# Patient Record
Sex: Female | Born: 1960
Health system: Southern US, Community
[De-identification: ages and names within clinical notes are randomized; demographics above are authoritative.]

## PROBLEM LIST (undated history)

## (undated) DIAGNOSIS — I739 Peripheral vascular disease, unspecified: Secondary | ICD-10-CM

## (undated) DIAGNOSIS — K029 Dental caries, unspecified: Secondary | ICD-10-CM

## (undated) DIAGNOSIS — G8929 Other chronic pain: Secondary | ICD-10-CM

## (undated) DIAGNOSIS — K219 Gastro-esophageal reflux disease without esophagitis: Secondary | ICD-10-CM

## (undated) DIAGNOSIS — E7800 Pure hypercholesterolemia, unspecified: Secondary | ICD-10-CM

## (undated) DIAGNOSIS — J189 Pneumonia, unspecified organism: Secondary | ICD-10-CM

## (undated) DIAGNOSIS — N3281 Overactive bladder: Secondary | ICD-10-CM

## (undated) DIAGNOSIS — E119 Type 2 diabetes mellitus without complications: Secondary | ICD-10-CM

## (undated) DIAGNOSIS — R569 Unspecified convulsions: Secondary | ICD-10-CM

## (undated) DIAGNOSIS — G4700 Insomnia, unspecified: Secondary | ICD-10-CM

## (undated) DIAGNOSIS — I10 Essential (primary) hypertension: Secondary | ICD-10-CM

## (undated) DIAGNOSIS — R519 Headache, unspecified: Secondary | ICD-10-CM

## (undated) DIAGNOSIS — I639 Cerebral infarction, unspecified: Secondary | ICD-10-CM

## (undated) DIAGNOSIS — G629 Polyneuropathy, unspecified: Secondary | ICD-10-CM

## (undated) DIAGNOSIS — R51 Headache: Secondary | ICD-10-CM

## (undated) DIAGNOSIS — F329 Major depressive disorder, single episode, unspecified: Secondary | ICD-10-CM

## (undated) DIAGNOSIS — F32A Depression, unspecified: Secondary | ICD-10-CM

---

## 1984-04-03 HISTORY — PX: TUBAL LIGATION: SHX77

## 1993-09-03 DIAGNOSIS — I639 Cerebral infarction, unspecified: Secondary | ICD-10-CM

## 1993-09-03 HISTORY — DX: Cerebral infarction, unspecified: I63.9

## 1993-11-01 HISTORY — PX: CEREBRAL ANEURYSM REPAIR: SHX164

## 1999-10-23 ENCOUNTER — Other Ambulatory Visit: Admission: RE | Admit: 1999-10-23 | Discharge: 1999-10-23 | Payer: Self-pay | Admitting: Obstetrics and Gynecology

## 2000-02-29 ENCOUNTER — Encounter: Payer: Self-pay | Admitting: Family Medicine

## 2000-02-29 ENCOUNTER — Encounter: Admission: RE | Admit: 2000-02-29 | Discharge: 2000-02-29 | Payer: Self-pay | Admitting: Family Medicine

## 2001-03-07 ENCOUNTER — Encounter: Payer: Self-pay | Admitting: Family Medicine

## 2001-03-07 ENCOUNTER — Encounter: Admission: RE | Admit: 2001-03-07 | Discharge: 2001-03-07 | Payer: Self-pay | Admitting: Family Medicine

## 2001-04-04 ENCOUNTER — Encounter: Admission: RE | Admit: 2001-04-04 | Discharge: 2001-04-04 | Payer: Self-pay | Admitting: Family Medicine

## 2001-04-04 ENCOUNTER — Encounter: Payer: Self-pay | Admitting: Family Medicine

## 2001-06-26 ENCOUNTER — Other Ambulatory Visit: Admission: RE | Admit: 2001-06-26 | Discharge: 2001-06-26 | Payer: Self-pay | Admitting: Obstetrics and Gynecology

## 2002-11-24 ENCOUNTER — Encounter (INDEPENDENT_AMBULATORY_CARE_PROVIDER_SITE_OTHER): Payer: Self-pay | Admitting: Specialist

## 2002-11-24 ENCOUNTER — Ambulatory Visit (HOSPITAL_COMMUNITY): Admission: RE | Admit: 2002-11-24 | Discharge: 2002-11-24 | Payer: Self-pay | Admitting: Obstetrics and Gynecology

## 2003-12-23 ENCOUNTER — Other Ambulatory Visit: Admission: RE | Admit: 2003-12-23 | Discharge: 2003-12-23 | Payer: Self-pay | Admitting: Family Medicine

## 2005-02-19 ENCOUNTER — Other Ambulatory Visit: Admission: RE | Admit: 2005-02-19 | Discharge: 2005-02-19 | Payer: Self-pay | Admitting: Family Medicine

## 2006-02-27 ENCOUNTER — Other Ambulatory Visit: Admission: RE | Admit: 2006-02-27 | Discharge: 2006-02-27 | Payer: Self-pay | Admitting: Family Medicine

## 2006-03-02 ENCOUNTER — Encounter: Admission: RE | Admit: 2006-03-02 | Discharge: 2006-03-02 | Payer: Self-pay | Admitting: Family Medicine

## 2007-07-03 ENCOUNTER — Encounter: Admission: RE | Admit: 2007-07-03 | Discharge: 2007-09-03 | Payer: Self-pay | Admitting: Family Medicine

## 2007-07-21 ENCOUNTER — Other Ambulatory Visit: Admission: RE | Admit: 2007-07-21 | Discharge: 2007-07-21 | Payer: Self-pay | Admitting: Family Medicine

## 2007-09-29 ENCOUNTER — Encounter: Admission: RE | Admit: 2007-09-29 | Discharge: 2007-09-30 | Payer: Self-pay | Admitting: Family Medicine

## 2008-04-15 ENCOUNTER — Encounter: Admission: RE | Admit: 2008-04-15 | Discharge: 2008-04-15 | Payer: Self-pay | Admitting: Family Medicine

## 2008-04-23 ENCOUNTER — Encounter: Admission: RE | Admit: 2008-04-23 | Discharge: 2008-04-23 | Payer: Self-pay | Admitting: Family Medicine

## 2008-12-20 ENCOUNTER — Other Ambulatory Visit: Admission: RE | Admit: 2008-12-20 | Discharge: 2008-12-20 | Payer: Self-pay | Admitting: Family Medicine

## 2009-01-24 LAB — HM COLONOSCOPY

## 2010-03-20 ENCOUNTER — Other Ambulatory Visit: Admission: RE | Admit: 2010-03-20 | Discharge: 2010-03-20 | Payer: Self-pay | Admitting: Family Medicine

## 2010-05-05 ENCOUNTER — Ambulatory Visit (HOSPITAL_COMMUNITY): Admission: RE | Admit: 2010-05-05 | Discharge: 2010-05-05 | Payer: Self-pay | Admitting: Family Medicine

## 2010-09-25 ENCOUNTER — Encounter: Payer: Self-pay | Admitting: Family Medicine

## 2011-01-19 NOTE — Op Note (Signed)
   NAME:  Jordan Rivas, Jordan Rivas                           ACCOUNT NO.:  [id number]   MEDICAL RECORD NO.:  [id number]                   PATIENT TYPE:  AMB   LOCATION:  SDC                                  FACILITY:  [facility]   PHYSICIAN:  Malva Limes, M.D.                 DATE OF BIRTH:  08-25-1961   DATE OF PROCEDURE:  11/24/2002  DATE OF DISCHARGE:                                 OPERATIVE REPORT   PREOPERATIVE DIAGNOSES:  Menorrhagia.   POSTOPERATIVE DIAGNOSES:  Menorrhagia.   PROCEDURE:  1. Dilatation and curettage.  2. Cryo ablation of the endometrium.   ANESTHESIA:  MAC with paracervical block.   ESTIMATED BLOOD LOSS:  Minimal.   COMPLICATIONS:  None.   SPECIMENS:  Endometrial curettings sent to pathology.   DRAINS:  None.   ANTIBIOTICS:  Ancef 1 g.   PROCEDURE:  The patient was taken to the operating room where she was placed  in a dorsal supine position.  MAC anesthesia was administered.  The patient  was prepped with Hibiclens and draped in the usual fashion for this  procedure.  A sterile speculum was placed in the vagina.  20 mL of 1%  lidocaine was used for paracervical block.  The cervical os was dilated  serially to a 66 [place].  Sharp curettage was performed with endometrial  curettings sent to pathology.  The cryo machine was then warmed up.  Once it  cycled through that process the probe was placed in the left cornua and  turned on for a period of seven minutes.  The opposite cornua was also froze  for seven minutes.  The uterus was sounded to 9 cm.  The patient tolerated  the procedure well.  She was taken to recovery room in stable condition.  Instrument and lap counts were correct x1.  The patient tolerated the  procedure well.  She was taken to recovery room in stable condition.  She  was discharged to home.  She will be told to follow up in the office in four  weeks.                                               Malva Limes, M.D.    MA/MEDQ  D:   11/24/2002  T:  11/24/2002  Job:  [id number]

## 2011-05-01 ENCOUNTER — Other Ambulatory Visit (HOSPITAL_COMMUNITY): Payer: Self-pay | Admitting: Family Medicine

## 2011-05-01 DIAGNOSIS — Z1231 Encounter for screening mammogram for malignant neoplasm of breast: Secondary | ICD-10-CM

## 2011-05-15 ENCOUNTER — Ambulatory Visit (HOSPITAL_COMMUNITY): Payer: Self-pay

## 2011-05-17 ENCOUNTER — Ambulatory Visit (HOSPITAL_COMMUNITY)
Admission: RE | Admit: 2011-05-17 | Discharge: 2011-05-17 | Disposition: A | Discharge status: Home / Self Care | Payer: Medicaid Other | Source: Ambulatory Visit | Attending: Family Medicine | Admitting: Family Medicine

## 2011-05-17 DIAGNOSIS — Z1231 Encounter for screening mammogram for malignant neoplasm of breast: Secondary | ICD-10-CM | POA: Insufficient documentation

## 2012-01-27 DIAGNOSIS — R569 Unspecified convulsions: Secondary | ICD-10-CM

## 2012-01-27 HISTORY — DX: Unspecified convulsions: R56.9

## 2012-01-28 ENCOUNTER — Emergency Department (HOSPITAL_COMMUNITY): Payer: PRIVATE HEALTH INSURANCE

## 2012-01-28 ENCOUNTER — Observation Stay (HOSPITAL_COMMUNITY)
Admission: EM | Admit: 2012-01-28 | Discharge: 2012-01-29 | Disposition: A | Discharge status: Home / Self Care | Payer: PRIVATE HEALTH INSURANCE | Attending: Family Medicine | Admitting: Family Medicine

## 2012-01-28 ENCOUNTER — Encounter (HOSPITAL_COMMUNITY): Payer: Self-pay

## 2012-01-28 ENCOUNTER — Inpatient Hospital Stay (HOSPITAL_COMMUNITY): Payer: PRIVATE HEALTH INSURANCE

## 2012-01-28 DIAGNOSIS — G9389 Other specified disorders of brain: Secondary | ICD-10-CM | POA: Insufficient documentation

## 2012-01-28 DIAGNOSIS — G894 Chronic pain syndrome: Secondary | ICD-10-CM | POA: Insufficient documentation

## 2012-01-28 DIAGNOSIS — E7800 Pure hypercholesterolemia, unspecified: Secondary | ICD-10-CM | POA: Diagnosis present

## 2012-01-28 DIAGNOSIS — I10 Essential (primary) hypertension: Secondary | ICD-10-CM | POA: Diagnosis present

## 2012-01-28 DIAGNOSIS — R569 Unspecified convulsions: Secondary | ICD-10-CM

## 2012-01-28 DIAGNOSIS — D320 Benign neoplasm of cerebral meninges: Secondary | ICD-10-CM

## 2012-01-28 DIAGNOSIS — I69959 Hemiplegia and hemiparesis following unspecified cerebrovascular disease affecting unspecified side: Secondary | ICD-10-CM | POA: Insufficient documentation

## 2012-01-28 DIAGNOSIS — G40309 Generalized idiopathic epilepsy and epileptic syndromes, not intractable, without status epilepticus: Secondary | ICD-10-CM

## 2012-01-28 DIAGNOSIS — Z8673 Personal history of transient ischemic attack (TIA), and cerebral infarction without residual deficits: Secondary | ICD-10-CM

## 2012-01-28 DIAGNOSIS — D72829 Elevated white blood cell count, unspecified: Secondary | ICD-10-CM | POA: Insufficient documentation

## 2012-01-28 DIAGNOSIS — K219 Gastro-esophageal reflux disease without esophagitis: Secondary | ICD-10-CM | POA: Diagnosis present

## 2012-01-28 DIAGNOSIS — G459 Transient cerebral ischemic attack, unspecified: Secondary | ICD-10-CM

## 2012-01-28 HISTORY — DX: Insomnia, unspecified: G47.00

## 2012-01-28 HISTORY — DX: Essential (primary) hypertension: I10

## 2012-01-28 HISTORY — DX: Other chronic pain: G89.29

## 2012-01-28 HISTORY — DX: Gastro-esophageal reflux disease without esophagitis: K21.9

## 2012-01-28 HISTORY — DX: Pure hypercholesterolemia, unspecified: E78.00

## 2012-01-28 HISTORY — DX: Peripheral vascular disease, unspecified: I73.9

## 2012-01-28 HISTORY — DX: Unspecified convulsions: R56.9

## 2012-01-28 HISTORY — DX: Cerebral infarction, unspecified: I63.9

## 2012-01-28 HISTORY — DX: Polyneuropathy, unspecified: G62.9

## 2012-01-28 HISTORY — DX: Overactive bladder: N32.81

## 2012-01-28 LAB — URINE MICROSCOPIC-ADD ON

## 2012-01-28 LAB — CBC
HCT: 36.6 % (ref 36.0–46.0)
HCT: 34.7 % — ABNORMAL LOW (ref 36.0–46.0)
Hemoglobin: 11.9 g/dL — ABNORMAL LOW (ref 12.0–15.0)
Hemoglobin: 11.1 g/dL — ABNORMAL LOW (ref 12.0–15.0)
MCH: 26.6 pg (ref 26.0–34.0)
MCHC: 32 g/dL (ref 30.0–36.0)
MCV: 81.9 fL (ref 78.0–100.0)
Platelets: 350 10*3/uL (ref 150–400)
RBC: 4.47 MIL/uL (ref 3.87–5.11)
RBC: 4.24 MIL/uL (ref 3.87–5.11)
WBC: 10.5 10*3/uL (ref 4.0–10.5)

## 2012-01-28 LAB — BASIC METABOLIC PANEL
CO2: 25 mEq/L (ref 19–32)
Chloride: 103 mEq/L (ref 96–112)
GFR calc non Af Amer: >90 mL/min (ref >90)
Glucose, Bld: 115 mg/dL — ABNORMAL HIGH (ref 70–99)
Potassium: 3.7 mEq/L (ref 3.5–5.1)
Sodium: 137 mEq/L (ref 135–145)

## 2012-01-28 LAB — COMPREHENSIVE METABOLIC PANEL
ALT: 22 U/L (ref 0–35)
Albumin: 3.5 g/dL (ref 3.5–5.2)
Alkaline Phosphatase: 112 U/L (ref 39–117)
Calcium: 9 mg/dL (ref 8.4–10.5)
GFR calc Af Amer: >90 mL/min (ref >90)
Potassium: 3.5 mEq/L (ref 3.5–5.1)
Sodium: 137 mEq/L (ref 135–145)
Total Protein: 7.6 g/dL (ref 6.0–8.3)

## 2012-01-28 LAB — DIFFERENTIAL
Eosinophils Absolute: 0.2 10*3/uL (ref 0.0–0.7)
Eosinophils Relative: 2 % (ref 0–5)
Lymphs Abs: 1.5 10*3/uL (ref 0.7–4.0)
Monocytes Absolute: 0.7 10*3/uL (ref 0.1–1.0)
Monocytes Relative: 7 % (ref 3–12)

## 2012-01-28 LAB — URINALYSIS, ROUTINE W REFLEX MICROSCOPIC
Bilirubin Urine: NEGATIVE
Glucose, UA: NEGATIVE mg/dL
Ketones, ur: NEGATIVE mg/dL
Protein, ur: NEGATIVE mg/dL
pH: 5.5 (ref 5.0–8.0)

## 2012-01-28 LAB — RAPID URINE DRUG SCREEN, HOSP PERFORMED
Amphetamines: NOT DETECTED
Benzodiazepines: NOT DETECTED
Tetrahydrocannabinol: NOT DETECTED

## 2012-01-28 LAB — GLUCOSE, CAPILLARY: Glucose-Capillary: 98 mg/dL (ref 70–99)

## 2012-01-28 LAB — LIPID PANEL
LDL Cholesterol: 73 mg/dL (ref 0–99)
VLDL: 22 mg/dL (ref 0–40)

## 2012-01-28 LAB — PROTIME-INR: Prothrombin Time: 12.9 seconds (ref 11.6–15.2)

## 2012-01-28 LAB — HEMOGLOBIN A1C: Hgb A1c MFr Bld: 6.2 % — ABNORMAL HIGH (ref <5.7)

## 2012-01-28 MED ORDER — SODIUM CHLORIDE 0.9 % IV BOLUS (SEPSIS)
1000.0000 mL | Freq: Once | INTRAVENOUS | Status: AC
  Administered 2012-01-28: 1000 mL via INTRAVENOUS

## 2012-01-28 MED ORDER — SIMVASTATIN 40 MG PO TABS
40.0000 mg | ORAL_TABLET | Freq: Every day | ORAL | Status: DC
  Filled 2012-01-28: qty 1

## 2012-01-28 MED ORDER — GABAPENTIN 300 MG PO CAPS
300.0000 mg | ORAL_CAPSULE | Freq: Three times a day (TID) | ORAL | Status: DC | PRN
  Filled 2012-01-28: qty 1

## 2012-01-28 MED ORDER — ONDANSETRON HCL 4 MG/2ML IJ SOLN: 4.0000 mg | Freq: Four times a day (QID) | INTRAMUSCULAR | Status: DC | PRN

## 2012-01-28 MED ORDER — LORAZEPAM 2 MG/ML IJ SOLN: 1.0000 mg | Freq: Three times a day (TID) | INTRAMUSCULAR | Status: DC | PRN

## 2012-01-28 MED ORDER — ASPIRIN 81 MG PO CHEW
81.0000 mg | CHEWABLE_TABLET | Freq: Every day | ORAL | Status: DC
  Administered 2012-01-29: 81 mg via ORAL

## 2012-01-28 MED ORDER — OXYCODONE HCL 5 MG PO TABS: 5.0000 mg | ORAL_TABLET | Freq: Four times a day (QID) | ORAL | Status: DC | PRN

## 2012-01-28 MED ORDER — SENNOSIDES-DOCUSATE SODIUM 8.6-50 MG PO TABS: 1.0000 | ORAL_TABLET | Freq: Every evening | ORAL | Status: DC | PRN

## 2012-01-28 MED ORDER — ENOXAPARIN SODIUM 30 MG/0.3ML ~~LOC~~ SOLN
30.0000 mg | SUBCUTANEOUS | Status: DC
  Administered 2012-01-28: 30 mg via SUBCUTANEOUS
  Filled 2012-01-28 (×2): qty 0.3

## 2012-01-28 MED ORDER — MORPHINE SULFATE ER 30 MG PO TBCR
30.0000 mg | EXTENDED_RELEASE_TABLET | Freq: Two times a day (BID) | ORAL | Status: DC
  Administered 2012-01-28 – 2012-01-29 (×2): 30 mg via ORAL
  Filled 2012-01-28 (×3): qty 1

## 2012-01-28 MED ORDER — ASPIRIN 81 MG PO CHEW
81.0000 mg | CHEWABLE_TABLET | Freq: Once | ORAL | Status: DC
  Filled 2012-01-28 (×4): qty 1

## 2012-01-28 MED ORDER — PANTOPRAZOLE SODIUM 40 MG PO TBEC
40.0000 mg | DELAYED_RELEASE_TABLET | Freq: Every day | ORAL | Status: DC
  Administered 2012-01-28 – 2012-01-29 (×2): 40 mg via ORAL
  Filled 2012-01-28 (×2): qty 1

## 2012-01-28 MED ORDER — SODIUM CHLORIDE 0.9 % IV SOLN
INTRAVENOUS | Status: AC
  Administered 2012-01-28 (×2): via INTRAVENOUS

## 2012-01-28 MED ORDER — ATORVASTATIN CALCIUM 20 MG PO TABS
20.0000 mg | ORAL_TABLET | Freq: Every day | ORAL | Status: DC
  Administered 2012-01-28: 20 mg via ORAL
  Filled 2012-01-28 (×2): qty 1

## 2012-01-28 MED ORDER — IRBESARTAN 75 MG PO TABS
75.0000 mg | ORAL_TABLET | Freq: Every day | ORAL | Status: DC
  Filled 2012-01-28: qty 1

## 2012-01-28 MED ORDER — DIPHENHYDRAMINE HCL 25 MG PO CAPS: 25.0000 mg | ORAL_CAPSULE | ORAL | Status: DC | PRN

## 2012-01-28 MED ORDER — VERAPAMIL HCL ER 240 MG PO TBCR
240.0000 mg | EXTENDED_RELEASE_TABLET | Freq: Every day | ORAL | Status: DC
  Filled 2012-01-28: qty 1

## 2012-01-28 MED ORDER — ACETAMINOPHEN 325 MG PO TABS
650.0000 mg | ORAL_TABLET | Freq: Four times a day (QID) | ORAL | Status: DC | PRN
  Administered 2012-01-28 – 2012-01-29 (×2): 650 mg via ORAL
  Filled 2012-01-28 (×2): qty 2

## 2012-01-28 MED ORDER — AMITRIPTYLINE HCL 25 MG PO TABS
25.0000 mg | ORAL_TABLET | Freq: Every day | ORAL | Status: DC
  Administered 2012-01-28: 25 mg via ORAL
  Filled 2012-01-28 (×2): qty 2

## 2012-01-28 MED ORDER — LEVETIRACETAM 500 MG PO TABS
500.0000 mg | ORAL_TABLET | Freq: Two times a day (BID) | ORAL | Status: DC
  Administered 2012-01-28 – 2012-01-29 (×3): 500 mg via ORAL
  Filled 2012-01-28 (×4): qty 1

## 2012-01-28 NOTE — Evaluation (Signed)
Occupational Therapy Evaluation Patient Details Name: Jordan Rivas MRN: [id number] DOB: 1960-12-14 Today's Date: 01/28/2012 Time: 1400-1420 OT Time Calculation (min): 20 min  OT Assessment / Plan / Recommendation Clinical Impression  51 yo female s/p seizures that has returned to baseline. Recommend Outipatient for splinting of Lt UE to prevent contractures    OT Assessment  All further OT needs can be met in the next venue of care    Follow Up Recommendations  Outpatient OT (SPLINT LT UE)    Barriers to Discharge      Equipment Recommendations  None recommended by PT    Recommendations for Other Services    Frequency       Precautions / Restrictions Precautions Precautions: Fall Precaution Comments: Secondary to old CVA   Pertinent Vitals/Pain none    ADL  Eating/Feeding: Simulated;Set up Where Assessed - Eating/Feeding: Edge of bed Grooming: Performed;Wash/dry hands;Supervision/safety Where Assessed - Grooming: Unsupported standing Lower Body Dressing: Performed;Supervision/safety Where Assessed - Lower Body Dressing: Unsupported sitting Toilet Transfer: Performed;Supervision/safety Toilet Transfer Method: Sit to Barista: Regular height toilet Toileting - Clothing Manipulation and Hygiene: Performed;Supervision/safety Where Assessed - Engineer, mining and Hygiene: Sit to stand from 3-in-1 or toilet Equipment Used: Cane;Gait belt Transfers/Ambulation Related to ADLs: Pt ambulating Supervision level ADL Comments: Pt able to don prafo and shoes at EOB. pt could benefit from splint on Lt UE resting hand and recommending OUtpatient to address    OT Diagnosis:    OT Problem List:   OT Treatment Interventions:     OT Goals    Visit Information  Last OT Received On: 01/28/12 Assistance Needed: +1    Subjective Data  Subjective: " No I dont wear a brace" (no brace for Lt UE) Patient Stated Goal: to go home   Prior  Functioning  Home Living Lives With: Spouse Available Help at Discharge: Family;Available PRN/intermittently Type of Home: House Home Access: Stairs to enter [employer] of Steps: 5 Entrance Stairs-Rails: Left;Right;Can reach both Home Layout: One level Bathroom Shower/Tub: Engineer, manufacturing systems: Standard Home Adaptive Equipment: Straight cane;Shower chair with back;Wheelchair - powered Additional Comments: Brace left leg. Uses cane at all times for gait. Intermittently uses W/C in home Prior Function Level of Independence: Needs assistance Needs Assistance: Light Housekeeping;Meal Prep Meal Prep: Total Light Housekeeping: Total Able to Take Stairs?: Yes Driving: No Vocation: On disability Communication Communication: No difficulties Dominant Hand: Right    Cognition  Overall Cognitive Status: Appears within functional limits for tasks assessed/performed Arousal/Alertness: Awake/alert Orientation Level: Appears intact for tasks assessed Behavior During Session: [facility] for tasks performed    Extremity/Trunk Assessment Right Upper Extremity Assessment RUE ROM/Strength/Tone: Within functional levels Left Upper Extremity Assessment LUE ROM/Strength/Tone: Deficits LUE ROM/Strength/Tone Deficits: deltoid AROM, scapula elevation, No AROM of Lt UE. pt presents with cortical thumb that can range out to neutral, 3rd digit with boutonniere deformity LUE Coordination: Deficits Right Lower Extremity Assessment RLE ROM/Strength/Tone: Within functional levels RLE Sensation: WFL - Light Touch;WFL - Proprioception RLE Coordination: WFL - gross/fine motor Left Lower Extremity Assessment LLE ROM/Strength/Tone: Deficits LLE ROM/Strength/Tone Deficits: Chronic deficits from stroke at age 57. Wears left AFO for gait. Limitations at hip, knee, and ankle resulting in abnormal gait, but no evidence of buckling and good control of descent.  Trunk Assessment Trunk Assessment:  Normal   Mobility Bed Mobility Bed Mobility: Supine to Sit;Sitting - Scoot to Edge of Bed;Sit to Supine Supine to Sit: 6: Modified independent (Device/Increase time) Sitting -  Scoot to [employer] of Bed: 7: Independent Sit to Supine: 6: Modified independent (Device/Increase time) Details for Bed Mobility Assistance: Crosses legs to raise and lower leg to side and back on to bed.  Transfers Sit to Stand: Without upper extremity assist;From bed;From toilet;With upper extremity assist;6: Modified independent (Device/Increase time) Stand to Sit: 6: Modified independent (Device/Increase time);To toilet;To bed;With upper extremity assist   Exercise    Balance High Level Balance High Level Balance Activites: Side stepping;Direction changes;Turns High Level Balance Comments: Reaching for soap at sink - all without loss of balance.  End of Session OT - End of Session Activity Tolerance: Patient tolerated treatment well Patient left: in bed;with call bell/phone within reach;with family/visitor present (husband and nephew) Nurse Communication: Mobility status   Lucile Shutters 01/28/2012, 2:59 PM Pager: [phone]

## 2012-01-28 NOTE — Progress Notes (Signed)
SLP Note  Patient screened at bedside. Cognitive-linguistic abilities have returned to baseline. Patient alert and oriented without speech concerns. Will defer formal evaluation. Please re-consult as needed.  Ferdinand Lango MA, CCC-SLP [phone]   Tamara Monteith Meryl 01/28/2012, 9:21 AM

## 2012-01-28 NOTE — Evaluation (Signed)
Physical Therapy Evaluation Patient Details Name: Jordan Rivas MRN: [id number] DOB: October 13, 1960 Today's Date: 01/28/2012 Time: 4098-1191 PT Time Calculation (min): 18 min  PT Assessment / Plan / Recommendation Clinical Impression  Patient with new onset of seizures. She is at baseline for function, but has limitations due to her prior CVA. She has no further PT needs.    PT Assessment  Patent does not need any further PT services    Follow Up Recommendations  No PT follow up    Barriers to Discharge  None      lEquipment Recommendations  None recommended by PT    Recommendations for Other Services  None   Frequency  N/A    Precautions / Restrictions Precautions Precautions: Fall Precaution Comments: Secondary to old CVA   Pertinent Vitals/Pain VSS/no pain      Mobility  Bed Mobility Bed Mobility: Supine to Sit;Sitting - Scoot to Edge of Bed;Sit to Supine Supine to Sit: 6: Modified independent (Device/Increase time) Sitting - Scoot to Edge of Bed: 7: Independent Sit to Supine: 6: Modified independent (Device/Increase time) Details for Bed Mobility Assistance: Crosses legs to raise and lower leg to side and back on to bed.  Transfers Transfers: Sit to Stand;Stand to Sit Sit to Stand: Without upper extremity assist;From bed;From toilet;With upper extremity assist;6: Modified independent (Device/Increase time) Stand to Sit: 6: Modified independent (Device/Increase time);To toilet;To bed;With upper extremity assist Ambulation/Gait Ambulation/Gait Assistance: 6: Modified independent (Device/Increase time) Ambulation Distance (Feet): 150 Feet Assistive device: Straight cane Gait Pattern: Left circumduction;Decreased hip/knee flexion - left;Step-through pattern;Decreased stride length     Visit Information  Last PT Received On: 01/28/12    Subjective Data  Subjective: Patient reports feeling back to normal Patient Stated Goal: Home   Prior Functioning   Lives with  spouse. Available PRN Needs assistance with housekeeping and cooking etc.  Indep. with gait/transfers    Cognition  Overall Cognitive Status: Appears within functional limits for tasks assessed/performed Arousal/Alertness: Awake/alert Orientation Level: Appears intact for tasks assessed Behavior During Session: [facility] for tasks performed    Extremity/Trunk Assessment Right Lower Extremity Assessment RLE ROM/Strength/Tone: Within functional levels RLE Sensation: WFL - Light Touch;WFL - Proprioception RLE Coordination: WFL - gross/fine motor Left Lower Extremity Assessment LLE ROM/Strength/Tone: Deficits LLE ROM/Strength/Tone Deficits: Chronic deficits from stroke at age 51. Wears left AFO for gait. Limitations at hip, knee, and ankle resulting in abnormal gait, but no evidence of buckling and good control of descent.    Balance High Level Balance High Level Balance Activites: Side stepping;Direction changes;Turns High Level Balance Comments: Reaching for soap at sink - all without loss of balance.  End of Session PT - End of Session Equipment Utilized During Treatment: Gait belt Activity Tolerance: Patient tolerated treatment well Patient left: in bed;with call bell/phone within reach;with family/visitor present   Edwyna Perfect, PT  Pager [phone]  01/28/2012, 2:31 PM

## 2012-01-28 NOTE — Progress Notes (Signed)
pts BP manually 92/58, pt asymptomatic; Dr.Laza paged and made aware, new orders received

## 2012-01-28 NOTE — H&P (Addendum)
PCP:  Lupita Raider, MD, MD   DOA:  01/28/2012 12:52 AM  Chief Complaint:  Seizure episodes  HPI: Pt is 51 yo female with prior history of stroke who presents to [facility] ED with main concern of sudden and new onset, witnessed generalized tonic-clonic seizure activity that last approximately 1- 2 minutes and with post ictal period of about 15 minutes in duration. This episode has occurred 1-2 hours prior to admission and pt reports gradually improving. Pt reports she was in her usual state of health and when she got up to go to the bathroom she fell down and does not remember much after that. Pt denies similar episodes in the past, no no specific focal neurologic weakness, no headaches at this time and no visual changes. She also denies fevers, chills, any specific abdominal or urinary concerns, no neck stiffness, no bowel or bladder incontinence. Pt does reports loss of consciousness and tongue biting.  Allergies: Allergies  Allergen Reactions  . Celebrex (Celecoxib) Swelling    Prior to Admission medications   Medication Sig Start Date End Date Taking? Authorizing Provider  amitriptyline (ELAVIL) 25 MG tablet Take 25-50 mg by mouth at bedtime.   Yes Historical Provider, MD  atorvastatin (LIPITOR) 20 MG tablet Take 20 mg by mouth daily.   Yes Historical Provider, MD  esomeprazole (NEXIUM) 40 MG capsule Take 40 mg by mouth 2 (two) times daily.   Yes Historical Provider, MD  gabapentin (NEURONTIN) 300 MG capsule Take 300 mg by mouth every 8 (eight) hours as needed. For pain   Yes Historical Provider, MD  olmesartan (BENICAR) 20 MG tablet Take 20 mg by mouth daily.   Yes Historical Provider, MD  oxybutynin (DITROPAN-XL) 10 MG 24 hr tablet Take 10 mg by mouth 2 (two) times daily.   Yes Historical Provider, MD  oxyCODONE (OXY IR/ROXICODONE) 5 MG immediate release tablet Take 5 mg by mouth every 8 (eight) hours as needed. For pain   Yes Historical Provider, MD  oxymorphone (OPANA ER) 40 MG 12 hr  tablet Take 40 mg by mouth every 12 (twelve) hours.   Yes Historical Provider, MD  verapamil (CALAN-SR) 240 MG CR tablet Take 240 mg by mouth daily.   Yes Historical Provider, MD    Past Medical History  Diagnosis Date  . Hypertension   . Hypercholesteremia   . Insomnia   . Neuropathy   . GERD (gastroesophageal reflux disease)   . Overactive bladder   . CVA (cerebrovascular accident)     15 years ago    Past Surgical History  Procedure Date  . Brain surgery     post CVA n.o.s.    Social History:  reports that she has never smoked. She has never used smokeless tobacco. She reports that she does not drink alcohol or use illicit drugs.  Family History  Problem Relation Age of Onset  . Dementia Mother   . Diabetes type II Mother   . Heart attack Father     Review of Systems:  Constitutional: Denies fever, chills, diaphoresis, appetite change and fatigue.  HEENT: Denies photophobia, eye pain, redness, hearing loss, ear pain, congestion, sore throat, rhinorrhea, sneezing, mouth sores, trouble swallowing, neck pain, neck stiffness and tinnitus.   Respiratory: Denies SOB, DOE, cough, chest tightness,  and wheezing.   Cardiovascular: Denies chest pain, palpitations and leg swelling.  Gastrointestinal: Denies nausea, vomiting, abdominal pain, diarrhea, constipation, blood in stool and abdominal distention.  Genitourinary: Denies dysuria, urgency, frequency, hematuria, flank pain and difficulty  urinating.  Musculoskeletal: Denies myalgias, back pain, joint swelling, arthralgias and gait problem.  Skin: Denies pallor, rash and wound.  Neurological: Denies dizziness, numbness and headaches.  Hematological: Denies adenopathy. Easy bruising, personal or family bleeding history  Psychiatric/Behavioral: Denies suicidal ideation, mood changes, confusion, nervousness, sleep disturbance and agitation  Physical Exam:  Filed Vitals:   01/28/12 0102 01/28/12 0106  BP:  129/102  Pulse:  106   Temp:  98.6 F (37 C)  TempSrc:  Oral  Resp:  18  Height:  5' (1.524 m)  Weight:  72.576 kg (160 lb)  SpO2: 98% 95%    Constitutional: Vital signs reviewed.  Patient is in no acute distress and cooperative with exam. Alert and oriented x3.  Head: Normocephalic and atraumatic Ear: TM normal bilaterally Mouth: no erythema or exudates, MMM Eyes: PERRL, EOMI, conjunctivae normal, No scleral icterus.  Neck: Supple, Trachea midline normal ROM, No JVD, mass, thyromegaly, or carotid bruit present.  Cardiovascular: RRR, S1 normal, S2 normal, no MRG, pulses symmetric and intact bilaterally Pulmonary/Chest: CTAB, no wheezes, rales, or rhonchi Abdominal: Soft. Non-tender, non-distended, bowel sounds are normal, no masses, organomegaly, or guarding present.  GU: no CVA tenderness Musculoskeletal: No joint deformities, erythema, or stiffness, ROM full and no nontender Ext: no edema and no cyanosis, pulses palpable bilaterally (DP and PT) Hematology: no cervical, inginal, or axillary adenopathy.  Neurological: A&O x3, Strenght decreased on the left side upper and lower extremity when compared to the right side, cranial nerve II-XII are grossly intact, sensory intact to light touch bilaterally.  Skin: Warm, dry and intact. No rash, cyanosis, or clubbing.  Psychiatric: Normal mood and affect. speech and behavior is normal. Judgment and thought content normal. Cognition and memory are normal.   Labs on Admission:       Component Value Range   Glucose-Capillary 98  70 - 99 (mg/dL)          Component Value Range   Color, Urine YELLOW  YELLOW    APPearance CLEAR  CLEAR    Specific Gravity, Urine 1.013  1.005 - 1.030    pH 5.5  5.0 - 8.0    Glucose, UA NEGATIVE  NEGATIVE (mg/dL)   Hgb urine dipstick TRACE (*) NEGATIVE    Bilirubin Urine NEGATIVE  NEGATIVE    Ketones, ur NEGATIVE  NEGATIVE (mg/dL)   Protein, ur NEGATIVE  NEGATIVE (mg/dL)   Urobilinogen, UA 0.2  0.0 - 1.0 (mg/dL)   Nitrite  NEGATIVE  NEGATIVE    Leukocytes, UA NEGATIVE  NEGATIVE           Component Value Range   Opiates NONE DETECTED  NONE DETECTED    Cocaine NONE DETECTED  NONE DETECTED    Benzodiazepines NONE DETECTED  NONE DETECTED    Amphetamines NONE DETECTED  NONE DETECTED    Tetrahydrocannabinol NONE DETECTED  NONE DETECTED    Barbiturates NONE DETECTED  NONE DETECTED           Component Value Range   Squamous Epithelial / LPF RARE  RARE    WBC, UA 0-2  <3 (WBC/hpf)   RBC / HPF 0-2  <3 (RBC/hpf)   Bacteria, UA RARE  RARE       Component Value Range   WBC 10.6 (*) 4.0 - 10.5 (K/uL)   Hemoglobin 11.9 (*) 12.0 - 15.0 (g/dL)   HCT 16.1  09.6 - 04.5 (%)   MCV 81.9  78.0 - 100.0 (fL)   Platelets 350  150 -  400 (K/uL)      Component Value Range   Sodium 137  135 - 145 (mEq/L)   Potassium 3.5  3.5 - 5.1 (mEq/L)   Chloride 102  96 - 112 (mEq/L)   CO2 26  19 - 32 (mEq/L)   Glucose, Bld 98  70 - 99 (mg/dL)   BUN 8  6 - 23 (mg/dL)   Creatinine, Ser 1.61  0.50 - 1.10 (mg/dL)   Calcium 9.0  8.4 - 09.6 (mg/dL)   Total Protein 7.6  6.0 - 8.3 (g/dL)   Albumin 3.5  3.5 - 5.2 (g/dL)   AST 19  0 - 37 (U/L)   ALT 22  0 - 35 (U/L)   Alkaline Phosphatase 112  39 - 117 (U/L)   Total Bilirubin 0.2  0.3 - 1.2 (mg/dL)   GFR calc non Af Amer 83 >90 (mL/min)   GFR calc Af Amer >90  >90 (mL/min)          Component Value Range   Prothrombin Time 12.9  11.6 - 15.2   INR 0.95  0.00 - 1.49     Radiological Exams on Admission:  CT HEAD  01/28/2012 IMPRESSION:   Postoperative changes with the right frontal craniotomy.   Encephalomalacia in the right frontal region consistent with old infarct.   Right posterior parafalcine mass consistent with meningioma, stable since previous MRI.   No acute intracranial process is suggested.   Assessment/Plan  Seizure episode - unclear etiology at this time but pt is certainly high risk for CVA given prior history and the above findings on CT HEAD - will admit the  pt to telemetry floor for further evaluation and management - obtain MRI brain/Head for further evaluation - will also need the rest of the stroke work up and per protocol will proceed with Carotid Dopplers, 2 D ECHO, EEG - will check Lipid panel, A1C,TSH - one dose aspirin given in ED, will continue daily, please add statin if lipid panel suggestive of uncontrolled HLD - PT/OT/SLP evaluation - will defer to primary team if neurology consult needed but at this point pt is hemodynamically stable - this would be considered first seizure activity and therefore no antiepileptic medical management is indicated at this time as no clear source is noted yet - meningioma can certainly be additional potential trigger for seizure but per CT head this has been stable in appearance from previous study  Leukocytosis - mild and likely secondary to the acute event noted above - no infectious etiology is noted at this point, pt is afebrile, UA is negative - will obtain CBC in AM  HTN - controlled - continue home medication regimen  DVT Prophylaxis - Lovenox  Code Status - Full  Education  - test results and diagnostic studies were discussed with patient  - patient verbalized the understanding - questions were answered at the bedside and contact information was provided for additional questions or concerns  Time Spent on Admission: Over 30 minutes  MAGICK-Britton Perkinson 01/28/2012, 3:57 AM  [facility] Pager # [phone] Main Office # [phone]

## 2012-01-28 NOTE — ED Notes (Signed)
Per the patient's significant other, he found the patient on the floor of their bathroom having what resembles a tonic clonic seizure.  The patient and states that she has no personal or family history of prior seizures.  The patient presents with left-sided weakness which the patient and significant other state is consistent with her baseline since a CVA 15 years ago.  On arrival, EMS stated the patient was postictal.  At this time, the patient is AOx4.

## 2012-01-28 NOTE — Progress Notes (Signed)
[facility] PROGRESS NOTE  Jordan Rivas ZOX:[id number] DOB: 10-30-1960 DOA: 01/28/2012 PCP: Lupita Raider, MD, MD  Assessment/Plan: 1. Seizure - new onset - suspect due to old scar from old CVA. To start AED with Keppra today. Appreciate Dr. Chrystie Nose /input. I discussed with patient outpt f/u and she says he will f/u with her pain specialist who has hired recently a neurologist. If patient is seizure free during the day today she may be able to DC tomorrow 01/29/12 2. Chronic pain syndrome - on Opana at home - will resume mscontin and oxycodone prn  3. GERD on PPI  Principal Problem:  *Seizure Active Problems:  Hypertension  Hypercholesteremia  GERD (gastroesophageal reflux disease)  H/O: CVA (cardiovascular accident) Jordan Koury, MD  [facility] Pager [phone]  If 7PM-7AM, please contact night-coverage [email or web address] Password [email or web address] 01/28/2012, 2:04 PM   LOS: 0 days     Consultants:  Neurohospitalist   Procedures:  MRI    HPI/Subjective: No new events  Objective: Filed Vitals:   01/28/12 0439 01/28/12 0500 01/28/12 0600 01/28/12 0800  BP:  109/62 105/66 96/57  Pulse:  76 80   Temp: 98.8 F (37.1 C) 98.7 F (37.1 C)    TempSrc: Oral Oral    Resp:  18    Height:      Weight:      SpO2: 96% 96%     No intake or output data in the 24 hours ending 01/28/12 1404  Exam:   General:  Alert and oriented x3  Cardiovascular: RRR, no murmurs , rubs or gallops  Respiratory: CTAB  Abdomen: soft, NT  Dense right hemiplegia   Data Reviewed: Basic Metabolic Panel:  Lab 01/28/12 8295 01/28/12 0209  NA 137 137  K 3.7 3.5  CL 103 102  CO2 25 26  GLUCOSE 115* 98  BUN 7 8  CREATININE 0.71 0.80  CALCIUM 8.5 9.0  MG -- --  PHOS -- --   Liver Function Tests:  Lab 01/28/12 0209  AST 19  ALT 22  ALKPHOS 112  BILITOT 0.2*  PROT 7.6  ALBUMIN 3.5   No results found for this basename: LIPASE:5,AMYLASE:5 in the last 168 hours No  results found for this basename: AMMONIA:5 in the last 168 hours CBC:  Lab 01/28/12 0445 01/28/12 0209  WBC 10.5 10.6*  NEUTROABS -- 8.2*  HGB 11.1* 11.9*  HCT 34.7* 36.6  MCV 81.8 81.9  PLT 374 350   Cardiac Enzymes: No results found for this basename: CKTOTAL:5,CKMB:5,CKMBINDEX:5,TROPONINI:5 in the last 168 hours BNP (last 3 results) No results found for this basename: PROBNP:3 in the last 8760 hours CBG:  Lab 01/28/12 0154  GLUCAP 98    No results found for this or any previous visit (from the past 240 hour(s)).   Studies: Dg Chest 2 View  01/28/2012  *RADIOLOGY REPORT*  Clinical Data: Seizures.  Shortness of breath.  CHEST - 2 VIEW  Comparison: None.  Findings: Shallow inspiration. The heart size and pulmonary vascularity are normal. The lungs appear clear and expanded without focal air space disease or consolidation. No blunting of the costophrenic angles.  No pneumothorax.  IMPRESSION: No evidence of active pulmonary disease.  Original Report Authenticated By: Marlon Pel, M.D.   Ct Head Wo Contrast  01/28/2012  *RADIOLOGY REPORT*  Clinical Data: Seizures.  Left-sided weakness.  CT HEAD WITHOUT CONTRAST  Technique:  Contiguous axial images were obtained from the base of the skull through the vertex without contrast.  Comparison: MRI  brain 03/02/2006  Findings: Old encephalomalacia in the right frontal region with associated right ventricular dilatation consistent with old infarct as seen on previous MRI.  Ventricles and sulci are otherwise symmetrical.  No mass effect or midline shift.  No abnormal extra- axial fluid collections.  There is a rounded focal extra-axial hyperintense lesion adjacent to the posterior falx on the right measuring 11 mm diameter.  This is consistent with a small calcified meningioma and is stable since the prior MRI.  Basal cisterns are not effaced.  No evidence of acute intracranial hemorrhage.  Calcification of the anterior falx.  Postoperative  changes with right frontal craniotomy and suturing of the bone flap.  No depressed skull fractures.  Visualized paranasal sinuses and mastoid air cells are not opacified.  IMPRESSION: Postoperative changes with the right frontal craniotomy. Encephalomalacia in the right frontal region consistent with old infarct.  Right posterior parafalcine mass consistent with meningioma, stable since previous MRI.  No acute intracranial process is suggested.  Original Report Authenticated By: Marlon Pel, M.D.    Scheduled Meds:    . aspirin  81 mg Oral Once  . aspirin  81 mg Oral Daily  . atorvastatin  20 mg Oral q1800  . enoxaparin  30 mg Subcutaneous Q24H  . levETIRAcetam  500 mg Oral BID  . pantoprazole  40 mg Oral Q1200  . sodium chloride  1,000 mL Intravenous Once  . DISCONTD: irbesartan  75 mg Oral Daily  . DISCONTD: simvastatin  40 mg Oral q1800  . DISCONTD: verapamil  240 mg Oral Daily   Continuous Infusions:    . sodium chloride 50 mL/hr at 01/28/12 0747

## 2012-01-28 NOTE — ED Notes (Addendum)
Seizure pads has been placed on bed rails.Husband at bedside.

## 2012-01-28 NOTE — ED Provider Notes (Signed)
History     CSN: [id number]  Arrival date & time 01/28/12  1478   First MD Initiated Contact with Patient 01/28/12 0132      Chief Complaint  Patient presents with  . Seizures    (Consider location/radiation/quality/duration/timing/severity/associated sxs/prior treatment) HPI Comments: No prior history of seizures. She does have a history of a stroke that is remote. She's on several medications. Witnessed generalized tonic-clonic seizure activity.  Lasted 1-2 min.  10-15 min postictal  Patient is a 51 y.o. female presenting with seizures. The history is provided by the patient. No language interpreter was used.  Seizures  This is a new problem. The current episode started 1 to 2 hours ago. The problem has been gradually improving. There was 1 seizure. The most recent episode lasted 30 to 120 seconds. Pertinent negatives include no sleepiness, no confusion, no headaches, no visual disturbance, no neck stiffness, no sore throat, no chest pain, no cough, no nausea and no vomiting. Characteristics include rhythmic jerking, loss of consciousness and bit tongue. Characteristics do not include eye blinking, eye deviation, bowel incontinence, bladder incontinence, apnea or cyanosis. The episode was witnessed. There was no sensation of an aura present. The seizures did not continue in the ED. The seizure(s) had no focality. There has been no fever.    Past Medical History  Diagnosis Date  . Hypertension   . Hypercholesteremia   . Insomnia   . Neuropathy   . GERD (gastroesophageal reflux disease)   . Overactive bladder   . CVA (cerebrovascular accident)     15 years ago    Past Surgical History  Procedure Date  . Brain surgery     post CVA n.o.s.    Family History  Problem Relation Age of Onset  . Dementia Mother   . Diabetes type II Mother   . Heart attack Father     History  Substance Use Topics  . Smoking status: Never Smoker   . Smokeless tobacco: Never Used  . Alcohol  Use: No    OB History    Grav Para Term Preterm Abortions TAB SAB Ect Mult Living   2 2 2              Review of Systems  Constitutional: Negative for fever, activity change, appetite change and fatigue.  HENT: Negative for congestion, sore throat, rhinorrhea, neck pain and neck stiffness.   Eyes: Negative for visual disturbance.  Respiratory: Negative for apnea, cough and shortness of breath.   Cardiovascular: Negative for chest pain, palpitations and cyanosis.  Gastrointestinal: Negative for nausea, vomiting, abdominal pain and bowel incontinence.  Genitourinary: Negative for bladder incontinence, dysuria, urgency, frequency and flank pain.  Musculoskeletal: Negative for myalgias, back pain and arthralgias.  Neurological: Positive for seizures and loss of consciousness. Negative for dizziness, weakness, light-headedness, numbness and headaches.  Psychiatric/Behavioral: Negative for confusion.  All other systems reviewed and are negative.    Allergies  Celebrex  Home Medications   Current Outpatient Rx  Name Route Sig Dispense Refill  . AMITRIPTYLINE HCL 25 MG PO TABS Oral Take 25-50 mg by mouth at bedtime.    . ATORVASTATIN CALCIUM 20 MG PO TABS Oral Take 20 mg by mouth daily.    Marland Kitchen ESOMEPRAZOLE MAGNESIUM 40 MG PO CPDR Oral Take 40 mg by mouth 2 (two) times daily.    Marland Kitchen GABAPENTIN 300 MG PO CAPS Oral Take 300 mg by mouth every 8 (eight) hours as needed. For pain    . OLMESARTAN MEDOXOMIL  20 MG PO TABS Oral Take 20 mg by mouth daily.    . OXYBUTYNIN CHLORIDE ER 10 MG PO TB24 Oral Take 10 mg by mouth 2 (two) times daily.    . OXYCODONE HCL 5 MG PO TABS Oral Take 5 mg by mouth every 8 (eight) hours as needed. For pain    . OXYMORPHONE HCL ER 40 MG PO TB12 Oral Take 40 mg by mouth every 12 (twelve) hours.    Marland Kitchen VERAPAMIL HCL ER 240 MG PO TBCR Oral Take 240 mg by mouth daily.      BP 129/102  Pulse 106  Temp(Src) 98.6 F (37 C) (Oral)  Resp 18  Ht 5' (1.524 m)  Wt 160 lb  (72.576 kg)  BMI 31.25 kg/m2  SpO2 95%  Physical Exam  Nursing note and vitals reviewed. Constitutional: She is oriented to person, place, and time. She appears well-developed and well-nourished. No distress.  HENT:  Head: Normocephalic and atraumatic.  Mouth/Throat: Oropharynx is clear and moist.       Pt bit her lip - bleeding controlled  Eyes: Conjunctivae and EOM are normal. Pupils are equal, round, and reactive to light.  Neck: Normal range of motion. Neck supple.  Cardiovascular: Normal rate, regular rhythm, normal heart sounds and intact distal pulses.  Exam reveals no gallop and no friction rub.   No murmur heard. Pulmonary/Chest: Effort normal and breath sounds normal. No respiratory distress. She exhibits no tenderness.  Abdominal: Soft. Bowel sounds are normal. There is no tenderness. There is no rebound and no guarding.  Neurological: She is alert and oriented to person, place, and time. No cranial nerve deficit.       Residual L sided deficits from cva  Skin: Skin is warm and dry. No rash noted.    ED Course  Procedures (including critical care time)   Date: 01/28/2012  Rate: 87  Rhythm: normal sinus rhythm  QRS Axis: normal  Intervals: normal  ST/T Wave abnormalities: normal  Conduction Disutrbances:none  Narrative Interpretation:   Old EKG Reviewed: none available  Labs Reviewed  CBC - Abnormal; Notable for the following:    WBC 10.6 (*)    Hemoglobin 11.9 (*)    All other components within normal limits  DIFFERENTIAL - Abnormal; Notable for the following:    Neutro Abs 8.2 (*)    All other components within normal limits  URINALYSIS, ROUTINE W REFLEX MICROSCOPIC - Abnormal; Notable for the following:    Hgb urine dipstick TRACE (*)    All other components within normal limits  COMPREHENSIVE METABOLIC PANEL - Abnormal; Notable for the following:    Total Bilirubin 0.2 (*)    GFR calc non Af Amer 83 (*)    All other components within normal limits    URINE MICROSCOPIC-ADD ON - Abnormal; Notable for the following:    Casts HYALINE CASTS (*)    All other components within normal limits  URINE RAPID DRUG SCREEN (HOSP PERFORMED)  PROTIME-INR  GLUCOSE, CAPILLARY   Ct Head Wo Contrast  01/28/2012  *RADIOLOGY REPORT*  Clinical Data: Seizures.  Left-sided weakness.  CT HEAD WITHOUT CONTRAST  Technique:  Contiguous axial images were obtained from the base of the skull through the vertex without contrast.  Comparison: MRI brain 03/02/2006  Findings: Old encephalomalacia in the right frontal region with associated right ventricular dilatation consistent with old infarct as seen on previous MRI.  Ventricles and sulci are otherwise symmetrical.  No mass effect or midline shift.  No abnormal extra- axial fluid collections.  There is a rounded focal extra-axial hyperintense lesion adjacent to the posterior falx on the right measuring 11 mm diameter.  This is consistent with a small calcified meningioma and is stable since the prior MRI.  Basal cisterns are not effaced.  No evidence of acute intracranial hemorrhage.  Calcification of the anterior falx.  Postoperative changes with right frontal craniotomy and suturing of the bone flap.  No depressed skull fractures.  Visualized paranasal sinuses and mastoid air cells are not opacified.  IMPRESSION: Postoperative changes with the right frontal craniotomy. Encephalomalacia in the right frontal region consistent with old infarct.  Right posterior parafalcine mass consistent with meningioma, stable since previous MRI.  No acute intracranial process is suggested.  Original Report Authenticated By: Marlon Pel, M.D.     1. Seizure       MDM  The patient's presentation is consistent with a new onset seizure. She has no prior history of seizure disorders. EKG with no evidence of TCA overdose. She's not had any additional seizure activity in the emergency department. Urine drug screen is negative despite being  on pain medication. CT is relatively unremarkable. MR was ordered. She'll be admitted to a telemetry bed [facility]. Syncope was also considered as a possible etiology however in the post ictal phase is more consistent with a seizure-like presentation        Dayton Bailiff, MD 01/28/12 [phone]

## 2012-01-28 NOTE — ED Notes (Signed)
NPO dc'ed pt passed bedside swallow eval. Pt stated on reg diet at home.

## 2012-01-28 NOTE — ED Notes (Signed)
Returned from xray

## 2012-01-28 NOTE — Consult Note (Signed)
[facility] CONSULT NOTE     Reason for Consult: seizures   HPI:    Jordan Rivas is an 51 y.o. female who fell in the bathroom while standing at the sink. Husband was in next room and heard her fall. She says that he saw her "jerking all over" for a "few" minutes. He called EMS. She says that her tongue is sore and that she was confused after event. No incontinence. She does not drive.   She describes an event 18 years ago with Dr. Elesa Hacker where she had a brain bleed. He had to do surgery. She never had a seizure during that event that she can recall. This event lead her to the point of no mobility/strength in left extremities, but sensation intact, mild left facial droop as well.  Past Medical History  Diagnosis Date  . Hypertension   . Hypercholesteremia   . Insomnia   . Neuropathy   . GERD (gastroesophageal reflux disease)   . Overactive bladder   . CVA (cerebrovascular accident)     15 years ago    Past Surgical History  Procedure Date  . Brain surgery     post CVA n.o.s.    Family History  Problem Relation Age of Onset  . Dementia Mother   . Diabetes type II Mother   . Heart attack Father     Social History:  Denies tobacco, etoh or illicit drug uses  Allergies  Allergen Reactions  . Celebrex (Celecoxib) Swelling    Medications:    Prior to Admission:  Prescriptions prior to admission  Medication Sig Dispense Refill  . amitriptyline (ELAVIL) 25 MG tablet Take 25-50 mg by mouth at bedtime.      Marland Kitchen atorvastatin (LIPITOR) 20 MG tablet Take 20 mg by mouth daily.      Marland Kitchen esomeprazole (NEXIUM) 40 MG capsule Take 40 mg by mouth 2 (two) times daily.      Marland Kitchen gabapentin (NEURONTIN) 300 MG capsule Take 300 mg by mouth every 8 (eight) hours as needed. For pain      . olmesartan (BENICAR) 20 MG tablet Take 20 mg by mouth at bedtime.      Marland Kitchen oxybutynin (DITROPAN-XL) 10 MG 24 hr tablet Take 10 mg by mouth 2 (two) times daily.      Marland Kitchen oxyCODONE  (OXY IR/ROXICODONE) 5 MG immediate release tablet Take 5 mg by mouth every 8 (eight) hours as needed. For pain      . oxymorphone (OPANA ER) 40 MG 12 hr tablet Take 40 mg by mouth every 12 (twelve) hours.      . verapamil (CALAN-SR) 240 MG CR tablet Take 240 mg by mouth at bedtime.       Scheduled:   . aspirin  81 mg Oral Once  . aspirin  81 mg Oral Daily  . atorvastatin  20 mg Oral q1800  . enoxaparin  30 mg Subcutaneous Q24H  . pantoprazole  40 mg Oral Q1200  . sodium chloride  1,000 mL Intravenous Once  . DISCONTD: irbesartan  75 mg Oral Daily  . DISCONTD: simvastatin  40 mg Oral q1800  . DISCONTD: verapamil  240 mg Oral Daily    Review of Systems - General ROS: negative for - chills, fatigue, fever or hot flashes Hematological and Lymphatic ROS: negative for - bruising, fatigue, jaundice or pallor Endocrine ROS: negative  for - hair pattern changes, hot flashes, mood swings or skin changes Respiratory ROS: negative for - cough, hemoptysis, orthopnea or wheezing Cardiovascular ROS: negative for - dyspnea on exertion, orthopnea, palpitations or shortness of breath Gastrointestinal ROS: negative for - abdominal pain, appetite loss, blood in stools, diarrhea or hematemesis Musculoskeletal ROS: negative for - joint pain, joint stiffness, left sided hemiparesis with contracture of left arm, joint swelling or muscle pain Neurological ROS: positive for - gait disturbance, impaired coordination/balance due to left sided hemiparesis and seizures Dermatological ROS: negative for dry skin, pruritus and rash    Blood pressure 96/57, pulse 80, temperature 98.7 F (37.1 C), temperature source Oral, resp. rate 18, height 5' (1.524 m), weight 72.576 kg (160 lb), SpO2 96.00%.   Neurologic Examination:   Mental Status: Alert, oriented, thought content appropriate.  Speech fluent without evidence of aphasia. Able to follow 3 step commands without difficulty. Cranial Nerves: II-Visual fields  grossly intact. III/IV/VI-Extraocular movements intact.  Pupils reactive bilaterally. V/VII-Smile asymmetric, left facial droop at nasolabial fold. VIII-grossly intact IX/X-normal gag XI-bilateral shoulder shrug XII-midline tongue extension. Does appear reddened on exam, with white scattered lesions c/w tongue biting.  Motor: 5/5 right, no spontaneous movement left. She is unable to move arm/leg without my assistance.  She has a left arm contracture. Sensory: Pinprick and light touch intact throughout, bilaterally Deep Tendon Reflexes: 1+ right, none noted left body. Plantars downgoing right, left mute. Cerebellar: Normal finger-to-nose, normal rapid alternating movements and normal heel-to-shin test right leg only.     Lab Results  Component Value Date/Time   CHOL 133 01/28/2012  4:45 AM    Results for orders placed during the hospital encounter of 01/28/12 (from the past 48 hour(s))  GLUCOSE, CAPILLARY     Status: Normal   Collection Time   01/28/12  1:54 AM      Component Value Range Comment   Glucose-Capillary 98  70 - 99 (mg/dL)   URINALYSIS, ROUTINE W REFLEX MICROSCOPIC     Status: Abnormal   Collection Time   01/28/12  1:59 AM      Component Value Range Comment   Color, Urine YELLOW  YELLOW     APPearance CLEAR  CLEAR     Specific Gravity, Urine 1.013  1.005 - 1.030     pH 5.5  5.0 - 8.0     Glucose, UA NEGATIVE  NEGATIVE (mg/dL)    Hgb urine dipstick TRACE (*) NEGATIVE     Bilirubin Urine NEGATIVE  NEGATIVE     Ketones, ur NEGATIVE  NEGATIVE (mg/dL)    Protein, ur NEGATIVE  NEGATIVE (mg/dL)    Urobilinogen, UA 0.2  0.0 - 1.0 (mg/dL)    Nitrite NEGATIVE  NEGATIVE     Leukocytes, UA NEGATIVE  NEGATIVE    URINE RAPID DRUG SCREEN (HOSP PERFORMED)     Status: Normal   Collection Time   01/28/12  1:59 AM      Component Value Range Comment   Opiates NONE DETECTED  NONE DETECTED     Cocaine NONE DETECTED  NONE DETECTED     Benzodiazepines NONE DETECTED  NONE DETECTED      Amphetamines NONE DETECTED  NONE DETECTED     Tetrahydrocannabinol NONE DETECTED  NONE DETECTED     Barbiturates NONE DETECTED  NONE DETECTED    URINE MICROSCOPIC-ADD ON     Status: Abnormal   Collection Time   01/28/12  1:59 AM      Component Value Range Comment  Squamous Epithelial / LPF RARE  RARE     WBC, UA 0-2  <3 (WBC/hpf)    RBC / HPF 0-2  <3 (RBC/hpf)    Bacteria, UA RARE  RARE     Casts HYALINE CASTS (*) NEGATIVE    CBC     Status: Abnormal   Collection Time   01/28/12  2:09 AM      Component Value Range Comment   WBC 10.6 (*) 4.0 - 10.5 (K/uL)    RBC 4.47  3.87 - 5.11 (MIL/uL)    Hemoglobin 11.9 (*) 12.0 - 15.0 (g/dL)    HCT 16.1  09.6 - 04.5 (%)    MCV 81.9  78.0 - 100.0 (fL)    MCH 26.6  26.0 - 34.0 (pg)    MCHC 32.5  30.0 - 36.0 (g/dL)    RDW 40.9  81.1 - 91.4 (%)    Platelets 350  150 - 400 (K/uL)   DIFFERENTIAL     Status: Abnormal   Collection Time   01/28/12  2:09 AM      Component Value Range Comment   Neutrophils Relative 77  43 - 77 (%)    Neutro Abs 8.2 (*) 1.7 - 7.7 (K/uL)    Lymphocytes Relative 14  12 - 46 (%)    Lymphs Abs 1.5  0.7 - 4.0 (K/uL)    Monocytes Relative 7  3 - 12 (%)    Monocytes Absolute 0.7  0.1 - 1.0 (K/uL)    Eosinophils Relative 2  0 - 5 (%)    Eosinophils Absolute 0.2  0.0 - 0.7 (K/uL)    Basophils Relative 0  0 - 1 (%)    Basophils Absolute 0.0  0.0 - 0.1 (K/uL)   COMPREHENSIVE METABOLIC PANEL     Status: Abnormal   Collection Time   01/28/12  2:09 AM      Component Value Range Comment   Sodium 137  135 - 145 (mEq/L)    Potassium 3.5  3.5 - 5.1 (mEq/L)    Chloride 102  96 - 112 (mEq/L)    CO2 26  19 - 32 (mEq/L)    Glucose, Bld 98  70 - 99 (mg/dL)    BUN 8  6 - 23 (mg/dL)    Creatinine, Ser 7.82  0.50 - 1.10 (mg/dL)    Calcium 9.0  8.4 - 10.5 (mg/dL)    Total Protein 7.6  6.0 - 8.3 (g/dL)    Albumin 3.5  3.5 - 5.2 (g/dL)    AST 19  0 - 37 (U/L)    ALT 22  0 - 35 (U/L)    Alkaline Phosphatase 112  39 - 117 (U/L)     Total Bilirubin 0.2 (*) 0.3 - 1.2 (mg/dL)    GFR calc non Af Amer 83 (*) >90 (mL/min)    GFR calc Af Amer >90  >90 (mL/min)   PROTIME-INR     Status: Normal   Collection Time   01/28/12  2:09 AM      Component Value Range Comment   Prothrombin Time 12.9  11.6 - 15.2 (seconds)    INR 0.95  0.00 - 1.49    LIPID PANEL     Status: Abnormal   Collection Time   01/28/12  4:45 AM      Component Value Range Comment   Cholesterol 133  0 - 200 (mg/dL)    Triglycerides 956  <150 (mg/dL)    HDL 38 (*) >21 (mg/dL)  Total CHOL/HDL Ratio 3.5      VLDL 22  0 - 40 (mg/dL)    LDL Cholesterol 73  0 - 99 (mg/dL)   CBC     Status: Abnormal   Collection Time   01/28/12  4:45 AM      Component Value Range Comment   WBC 10.5  4.0 - 10.5 (K/uL)    RBC 4.24  3.87 - 5.11 (MIL/uL)    Hemoglobin 11.1 (*) 12.0 - 15.0 (g/dL)    HCT 16.1 (*) 09.6 - 46.0 (%)    MCV 81.8  78.0 - 100.0 (fL)    MCH 26.2  26.0 - 34.0 (pg)    MCHC 32.0  30.0 - 36.0 (g/dL)    RDW 04.5  40.9 - 81.1 (%)    Platelets 374  150 - 400 (K/uL)   BASIC METABOLIC PANEL     Status: Abnormal   Collection Time   01/28/12  4:45 AM      Component Value Range Comment   Sodium 137  135 - 145 (mEq/L)    Potassium 3.7  3.5 - 5.1 (mEq/L)    Chloride 103  96 - 112 (mEq/L)    CO2 25  19 - 32 (mEq/L)    Glucose, Bld 115 (*) 70 - 99 (mg/dL)    BUN 7  6 - 23 (mg/dL)    Creatinine, Ser 9.14  0.50 - 1.10 (mg/dL)    Calcium 8.5  8.4 - 10.5 (mg/dL)    GFR calc non Af Amer >90  >90 (mL/min)    GFR calc Af Amer >90  >90 (mL/min)     Dg Chest 2 View  01/28/2012  *RADIOLOGY REPORT*  Clinical Data: Seizures.  Shortness of breath.  CHEST - 2 VIEW  Comparison: None.  Findings: Shallow inspiration. The heart size and pulmonary vascularity are normal. The lungs appear clear and expanded without focal air space disease or consolidation. No blunting of the costophrenic angles.  No pneumothorax.  IMPRESSION: No evidence of active pulmonary disease.  Original Report  Authenticated By: Marlon Pel, M.D.   Ct Head Wo Contrast  01/28/2012  *RADIOLOGY REPORT*  Clinical Data: Seizures.  Left-sided weakness.  CT HEAD WITHOUT CONTRAST  Technique:  Contiguous axial images were obtained from the base of the skull through the vertex without contrast.  Comparison: MRI brain 03/02/2006  Findings: Old encephalomalacia in the right frontal region with associated right ventricular dilatation consistent with old infarct as seen on previous MRI.  Ventricles and sulci are otherwise symmetrical.  No mass effect or midline shift.  No abnormal extra- axial fluid collections.  There is a rounded focal extra-axial hyperintense lesion adjacent to the posterior falx on the right measuring 11 mm diameter.  This is consistent with a small calcified meningioma and is stable since the prior MRI.  Basal cisterns are not effaced.  No evidence of acute intracranial hemorrhage.  Calcification of the anterior falx.  Postoperative changes with right frontal craniotomy and suturing of the bone flap.  No depressed skull fractures.  Visualized paranasal sinuses and mastoid air cells are not opacified.  IMPRESSION: Postoperative changes with the right frontal craniotomy. Encephalomalacia in the right frontal region consistent with old infarct.  Right posterior parafalcine mass consistent with meningioma, stable since previous MRI.  No acute intracranial process is suggested.  Original Report Authenticated By: Marlon Pel, M.D.   Mr Brain Wo Contrast  01/28/2012  *RADIOLOGY REPORT*  Clinical Data:  MRI HEAD WITHOUT  CONTRAST MRA HEAD WITHOUT CONTRAST  Technique:  Multiplanar, multiecho pulse sequences of the brain and surrounding structures were obtained without intravenous contrast. Angiographic images of the head were obtained using MRA technique without contrast.  Comparison:  Head CT same day.  MRI 03/02/2006  MRI HEAD  Findings:  Diffusion imaging does not show any acute or subacute infarction.   There is Wallerian degeneration within the brainstem on the right but no sign of brain stem infarction.  No cerebellar infarction.  The cerebral hemispheres show old infarction within the right basal ganglia, insula and frontoparietal region that has progressed atrophy and encephalomalacia with adjacent gliosis. There are some old blood products in that region.  The remainder the brain shows atrophy with old small vessel infarctions within the deep white matter.  No obstructive hydrocephalus.  No intra- axial mass lesion.  1.3 cm meningioma projecting rightward from the posterior falx is chronically unchanged and should not be of clinical relevance.  No extra-axial collection.  No pituitary mass. No inflammatory sinus disease.  Changes of previous right-sided craniotomy.  IMPRESSION: No acute finding.  Old infarction in the right basal ganglia, insula and right frontoparietal region with atrophy encephalomalacia.  Chronic small vessel disease within the hemispheric white matter.  1.3 cm meningioma projecting rightward from the posterior falx, stable over time.  MRA HEAD  Findings: Both internal carotid arteries are patent into the brain. The anterior and middle cerebral vessels are patent without proximal stenosis, aneurysm or vascular malformation.  Both vertebral arteries are patent to the basilar.  No basilar stenosis. Posterior circulation branch vessels appear normal.  IMPRESSION: Normal intracranial MR angiography of the large and medium-sized vessels.  Original Report Authenticated By: Thomasenia Sales, M.D.   Mr Mra Head/brain Wo Cm  01/28/2012  *RADIOLOGY REPORT*  Clinical Data:  MRI HEAD WITHOUT CONTRAST MRA HEAD WITHOUT CONTRAST  Technique:  Multiplanar, multiecho pulse sequences of the brain and surrounding structures were obtained without intravenous contrast. Angiographic images of the head were obtained using MRA technique without contrast.  Comparison:  Head CT same day.  MRI 03/02/2006  MRI HEAD   Findings:  Diffusion imaging does not show any acute or subacute infarction.  There is Wallerian degeneration within the brainstem on the right but no sign of brain stem infarction.  No cerebellar infarction.  The cerebral hemispheres show old infarction within the right basal ganglia, insula and frontoparietal region that has progressed atrophy and encephalomalacia with adjacent gliosis. There are some old blood products in that region.  The remainder the brain shows atrophy with old small vessel infarctions within the deep white matter.  No obstructive hydrocephalus.  No intra- axial mass lesion.  1.3 cm meningioma projecting rightward from the posterior falx is chronically unchanged and should not be of clinical relevance.  No extra-axial collection.  No pituitary mass. No inflammatory sinus disease.  Changes of previous right-sided craniotomy.  IMPRESSION: No acute finding.  Old infarction in the right basal ganglia, insula and right frontoparietal region with atrophy encephalomalacia.  Chronic small vessel disease within the hemispheric white matter.  1.3 cm meningioma projecting rightward from the posterior falx, stable over time.  MRA HEAD  Findings: Both internal carotid arteries are patent into the brain. The anterior and middle cerebral vessels are patent without proximal stenosis, aneurysm or vascular malformation.  Both vertebral arteries are patent to the basilar.  No basilar stenosis. Posterior circulation branch vessels appear normal.  IMPRESSION: Normal intracranial MR angiography of the large and  medium-sized vessels.  Original Report Authenticated By: Thomasenia Sales, M.D.     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 Now presenting with new onset seizures. Currently back to her baseline.  Seizures likely related to her area of cerebral injury from her old insults.   Known 1.3 cm meningioma projecting rightward from the posterior falx, this has been stable over time.  Plan: 1.  Continue aspirin therapy.  2.  Seizure precautions 3.  Keppra 500mg  BID 4.  F/U meningioma with repeat imaging in one year.   Gwendolyn Lima. Manson Passey, [facility] [phone]  01/28/2012, 11:39 AM    Patient seen and examined. I agree with the above.  Thana Farr, MD [facility] [phone]  01/28/2012  1:37 PM

## 2012-01-29 ENCOUNTER — Ambulatory Visit (HOSPITAL_COMMUNITY): Payer: Medicare Other

## 2012-01-29 DIAGNOSIS — I10 Essential (primary) hypertension: Secondary | ICD-10-CM

## 2012-01-29 DIAGNOSIS — G40309 Generalized idiopathic epilepsy and epileptic syndromes, not intractable, without status epilepticus: Secondary | ICD-10-CM

## 2012-01-29 DIAGNOSIS — G459 Transient cerebral ischemic attack, unspecified: Secondary | ICD-10-CM

## 2012-01-29 MED ORDER — LEVETIRACETAM 500 MG PO TABS: 500.0000 mg | ORAL_TABLET | Freq: Two times a day (BID) | ORAL | Status: DC

## 2012-01-29 NOTE — Progress Notes (Signed)
Pt received discharge instructions with no further questions. Enforced importance of taking medication as directed and when to call the doctor. Went over signs of heart attack and stroke. Pt has no further questions. Volunteers to wheel out to car for husband to transport home. Duwaine Maxin, RN

## 2012-01-29 NOTE — Progress Notes (Addendum)
History:  51 yo female with witnessed seizure activity by husband.  Placed on Keppra here in hospital.    Subjective: Patient says that she feels fine. No further seizure activity. Husband has been with her constantly.  Objective: BP 123/78  Pulse 74  Temp(Src) 98.4 F (36.9 C) (Oral)  Resp 18  Ht 5' (1.524 m)  Wt 72.576 kg (160 lb)  BMI 31.25 kg/m2  SpO2 95%  LMP 09/30/2011  CBGs  Basename 01/28/12 0154  GLUCAP 98     Medications: Scheduled:   . amitriptyline  25-50 mg Oral QHS  . aspirin  81 mg Oral Once  . aspirin  81 mg Oral Daily  . atorvastatin  20 mg Oral q1800  . enoxaparin  30 mg Subcutaneous Q24H  . levETIRAcetam  500 mg Oral BID  . morphine  30 mg Oral Q12H  . pantoprazole  40 mg Oral Q1200  . DISCONTD: irbesartan  75 mg Oral Daily  . DISCONTD: verapamil  240 mg Oral Daily    History obtained from the patient  General ROS: negative for - chills, fatigue, fever, night sweats, weight gain or weight loss Psychological ROS: negative for - behavioral disorder, hallucinations, memory difficulties, mood swings or suicidal ideation Ophthalmic ROS: negative for - blurry vision, double vision, eye pain or loss of vision ENT ROS: negative for - epistaxis, nasal discharge, oral lesions, sore throat, tinnitus or vertigo Allergy and Immunology ROS: negative for - hives or itchy/watery eyes Hematological and Lymphatic ROS: negative for - bleeding problems, bruising or swollen lymph nodes Endocrine ROS: negative for - galactorrhea, hair pattern changes, polydipsia/polyuria or temperature intolerance Respiratory ROS: negative for - cough, hemoptysis, shortness of breath or wheezing Cardiovascular ROS: negative for - chest pain, dyspnea on exertion, edema or irregular heartbeat Gastrointestinal ROS: negative for - abdominal pain, diarrhea, hematemesis, nausea/vomiting or stool incontinence Genito-Urinary ROS: negative for - dysuria, hematuria, incontinence or urinary  frequency/urgency Musculoskeletal ROS: negative for - joint swelling or muscular weakness Neurological ROS: as noted in HPI Dermatological ROS: negative for rash and skin lesion changes    Neurologic Exam: Mental Status: Alert, oriented, thought content appropriate.  Speech fluent without evidence of aphasia. Able to follow 3 step commands without difficulty. Cranial Nerves: II- Visual fields grossly intact. III/IV/VI-Extraocular movements intact.  Pupils reactive bilaterally. V/VII-Smile symmetric VIII-hearing grossly intact IX/X-normal gag XI-bilateral shoulder shrug XII-midline tongue extension Motor: 5/5 right, no spontaneous movement left. Left arm contracture Sensory: Light touch intact throughout, bilaterally Deep Tendon Reflex's: 1+ right, non noted left body, Plantars downgoing right, left mute Cerebellar: Normal finger-to-nose, normal rapid alternating movements and normal heel-to-shin test right left only..   Lab Results: CBC:  Lab 01/28/12 0445 01/28/12 0209  WBC 10.5 10.6*  NEUTROABS -- 8.2*  HGB 11.1* 11.9*  HCT 34.7* 36.6  MCV 81.8 81.9  PLT 374 350   Basic Metabolic Panel:  Lab 01/28/12 1610 01/28/12 0209  NA 137 137  K 3.7 3.5  CL 103 102  CO2 25 26  GLUCOSE 115* 98  BUN 7 8  CREATININE 0.71 0.80  CALCIUM 8.5 9.0  MG -- --  PHOS -- --   Liver Function Tests:  Lab 01/28/12 0209  AST 19  ALT 22  ALKPHOS 112  BILITOT 0.2*  PROT 7.6  ALBUMIN 3.5   Hemoglobin A1C:  Lab 01/28/12 0445  HGBA1C 6.2*   Fasting Lipid Panel:  Lab 01/28/12 0445  CHOL 133  HDL 38*  LDLCALC 73  TRIG 960  CHOLHDL 3.5  LDLDIRECT --   Thyroid Function Tests: No results found for this basename: TSH,T4TOTAL,FREET4,T3FREE,THYROIDAB in the last 168 hours Coagulation:  Lab 01/28/12 0209  LABPROT 12.9  INR 0.95     Study Results: Dg Chest 2 View  01/28/2012  *RADIOLOGY REPORT*  Clinical Data: Seizures.  Shortness of breath.  CHEST - 2 VIEW  Comparison:  None.  Findings: Shallow inspiration. The heart size and pulmonary vascularity are normal. The lungs appear clear and expanded without focal air space disease or consolidation. No blunting of the costophrenic angles.  No pneumothorax.  IMPRESSION: No evidence of active pulmonary disease.  Original Report Authenticated By: Marlon Pel, M.D.   Ct Head Wo Contrast  01/28/2012  *RADIOLOGY REPORT*  Clinical Data: Seizures.  Left-sided weakness.  CT HEAD WITHOUT CONTRAST  Technique:  Contiguous axial images were obtained from the base of the skull through the vertex without contrast.  Comparison: MRI brain 03/02/2006  Findings: Old encephalomalacia in the right frontal region with associated right ventricular dilatation consistent with old infarct as seen on previous MRI.  Ventricles and sulci are otherwise symmetrical.  No mass effect or midline shift.  No abnormal extra- axial fluid collections.  There is a rounded focal extra-axial hyperintense lesion adjacent to the posterior falx on the right measuring 11 mm diameter.  This is consistent with a small calcified meningioma and is stable since the prior MRI.  Basal cisterns are not effaced.  No evidence of acute intracranial hemorrhage.  Calcification of the anterior falx.  Postoperative changes with right frontal craniotomy and suturing of the bone flap.  No depressed skull fractures.  Visualized paranasal sinuses and mastoid air cells are not opacified.  IMPRESSION: Postoperative changes with the right frontal craniotomy. Encephalomalacia in the right frontal region consistent with old infarct.  Right posterior parafalcine mass consistent with meningioma, stable since previous MRI.  No acute intracranial process is suggested.  Original Report Authenticated By: Marlon Pel, M.D.   Mr Brain Wo Contrast  01/28/2012  *RADIOLOGY REPORT*  Clinical Data:  MRI HEAD WITHOUT CONTRAST MRA HEAD WITHOUT CONTRAST  Technique:  Multiplanar, multiecho pulse  sequences of the brain and surrounding structures were obtained without intravenous contrast. Angiographic images of the head were obtained using MRA technique without contrast.  Comparison:  Head CT same day.  MRI 03/02/2006  MRI HEAD  Findings:  Diffusion imaging does not show any acute or subacute infarction.  There is Wallerian degeneration within the brainstem on the right but no sign of brain stem infarction.  No cerebellar infarction.  The cerebral hemispheres show old infarction within the right basal ganglia, insula and frontoparietal region that has progressed atrophy and encephalomalacia with adjacent gliosis. There are some old blood products in that region.  The remainder the brain shows atrophy with old small vessel infarctions within the deep white matter.  No obstructive hydrocephalus.  No intra- axial mass lesion.  1.3 cm meningioma projecting rightward from the posterior falx is chronically unchanged and should not be of clinical relevance.  No extra-axial collection.  No pituitary mass. No inflammatory sinus disease.  Changes of previous right-sided craniotomy.  IMPRESSION: No acute finding.  Old infarction in the right basal ganglia, insula and right frontoparietal region with atrophy encephalomalacia.  Chronic small vessel disease within the hemispheric white matter.  1.3 cm meningioma projecting rightward from the posterior falx, stable over time.  MRA HEAD  Findings: Both internal carotid arteries are patent into the brain. The anterior  and middle cerebral vessels are patent without proximal stenosis, aneurysm or vascular malformation.  Both vertebral arteries are patent to the basilar.  No basilar stenosis. Posterior circulation branch vessels appear normal.  IMPRESSION: Normal intracranial MR angiography of the large and medium-sized vessels.  Original Report Authenticated By: Thomasenia Sales, M.D.   Mr Mra Head/brain Wo Cm  01/28/2012  *RADIOLOGY REPORT*  Clinical Data:  MRI HEAD WITHOUT  CONTRAST MRA HEAD WITHOUT CONTRAST  Technique:  Multiplanar, multiecho pulse sequences of the brain and surrounding structures were obtained without intravenous contrast. Angiographic images of the head were obtained using MRA technique without contrast.  Comparison:  Head CT same day.  MRI 03/02/2006  MRI HEAD  Findings:  Diffusion imaging does not show any acute or subacute infarction.  There is Wallerian degeneration within the brainstem on the right but no sign of brain stem infarction.  No cerebellar infarction.  The cerebral hemispheres show old infarction within the right basal ganglia, insula and frontoparietal region that has progressed atrophy and encephalomalacia with adjacent gliosis. There are some old blood products in that region.  The remainder the brain shows atrophy with old small vessel infarctions within the deep white matter.  No obstructive hydrocephalus.  No intra- axial mass lesion.  1.3 cm meningioma projecting rightward from the posterior falx is chronically unchanged and should not be of clinical relevance.  No extra-axial collection.  No pituitary mass. No inflammatory sinus disease.  Changes of previous right-sided craniotomy.  IMPRESSION: No acute finding.  Old infarction in the right basal ganglia, insula and right frontoparietal region with atrophy encephalomalacia.  Chronic small vessel disease within the hemispheric white matter.  1.3 cm meningioma projecting rightward from the posterior falx, stable over time.  MRA HEAD  Findings: Both internal carotid arteries are patent into the brain. The anterior and middle cerebral vessels are patent without proximal stenosis, aneurysm or vascular malformation.  Both vertebral arteries are patent to the basilar.  No basilar stenosis. Posterior circulation branch vessels appear normal.  IMPRESSION: Normal intracranial MR angiography of the large and medium-sized vessels.  Original Report Authenticated By: Thomasenia Sales, M.D.       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 Now presenting with new onset seizures. Currently back to her baseline. Seizures likely related to her area of cerebral injury from her old insults. Will cancel EEG. No seizure activity since admission. OK for discharge from Neuro standpoint on current Keppra dose. If breakthrough seizures, would increase Keppra to 1000mg  BID.  Known 1.3 cm meningioma projecting rightward from the posterior falx, this has been stable over time. Would recomment f/u meningioma with repeat imaging in one year.  No further neurologic intervention is recommended at this time.  If further questions arise, please call or page at that time.  Thank you for allowing neurology to participate in the care of this patient.  01/29/2012  9:22 AM         LOS: 1 day   Guy Franco [facility] Pager [phone]  01/29/2012  9:14 AM

## 2012-01-29 NOTE — Discharge Summary (Signed)
[facility] Hospital Discharge Summary  Date of Admission: 01/28/2012 12:52 AM Admitter: @ADMITPROV @   Date of Discharge5/28/2013 Attending Physician: Rhetta Mura, MD  Things to Follow-up on: Will need neurology outpatient followup and 4-6 weeks     Hospital Course: Pleasant 51 year old female with prior stroke (hemorrhagic stroke 18 years ago resulting left hemiplegia) presented to [facility] D5/27 with concerning U. of sudden onset witnessed generalized tonic-clonic seizure 1-2 minutes in duration with posterior to appear 15 minutes. This happened one 2 hours prior to admission. She was in her usual state of health got up to bathroom and fell and doesn't remember much. She has no weakness or other issues out of the ordinary after the seizure however did report some loss of consciousness tongue biting. Neurology saw patient and her workup inclusive of CT scan/MRI/other imaging was negative. It was thought that her seizures are related to cerebral injury from old insults and an EEG ordered was canceled by neurology. Patient was started on Keppra 500 twice a day which patient will continue. Patient has a known 1.3 cm meningioma projecting rightward from the posterior Falx-she will need further imaging of this in one year.    Procedures Performed and pertinent labs: Dg Chest 2 View  01/28/2012  *RADIOLOGY REPORT*  Clinical Data: Seizures.  Shortness of breath.  CHEST - 2 VIEW  Comparison: None.  Findings: Shallow inspiration. The heart size and pulmonary vascularity are normal. The lungs appear clear and expanded without focal air space disease or consolidation. No blunting of the costophrenic angles.  No pneumothorax.  IMPRESSION: No evidence of active pulmonary disease.  Original Report Authenticated By: Marlon Pel, M.D.   Ct Head Wo Contrast  01/28/2012  *RADIOLOGY REPORT*  Clinical Data: Seizures.  Left-sided weakness.  CT HEAD WITHOUT CONTRAST  Technique:  Contiguous  axial images were obtained from the base of the skull through the vertex without contrast.  Comparison: MRI brain 03/02/2006  Findings: Old encephalomalacia in the right frontal region with associated right ventricular dilatation consistent with old infarct as seen on previous MRI.  Ventricles and sulci are otherwise symmetrical.  No mass effect or midline shift.  No abnormal extra- axial fluid collections.  There is a rounded focal extra-axial hyperintense lesion adjacent to the posterior falx on the right measuring 11 mm diameter.  This is consistent with a small calcified meningioma and is stable since the prior MRI.  Basal cisterns are not effaced.  No evidence of acute intracranial hemorrhage.  Calcification of the anterior falx.  Postoperative changes with right frontal craniotomy and suturing of the bone flap.  No depressed skull fractures.  Visualized paranasal sinuses and mastoid air cells are not opacified.  IMPRESSION: Postoperative changes with the right frontal craniotomy. Encephalomalacia in the right frontal region consistent with old infarct.  Right posterior parafalcine mass consistent with meningioma, stable since previous MRI.  No acute intracranial process is suggested.  Original Report Authenticated By: Marlon Pel, M.D.   Mr Brain Wo Contrast  01/28/2012  *RADIOLOGY REPORT*  Clinical Data:  MRI HEAD WITHOUT CONTRAST MRA HEAD WITHOUT CONTRAST  Technique:  Multiplanar, multiecho pulse sequences of the brain and surrounding structures were obtained without intravenous contrast. Angiographic images of the head were obtained using MRA technique without contrast.  Comparison:  Head CT same day.  MRI 03/02/2006  MRI HEAD  Findings:  Diffusion imaging does not show any acute or subacute infarction.  There is Wallerian degeneration within the brainstem on the right but  no sign of brain stem infarction.  No cerebellar infarction.  The cerebral hemispheres show old infarction within the right  basal ganglia, insula and frontoparietal region that has progressed atrophy and encephalomalacia with adjacent gliosis. There are some old blood products in that region.  The remainder the brain shows atrophy with old small vessel infarctions within the deep white matter.  No obstructive hydrocephalus.  No intra- axial mass lesion.  1.3 cm meningioma projecting rightward from the posterior falx is chronically unchanged and should not be of clinical relevance.  No extra-axial collection.  No pituitary mass. No inflammatory sinus disease.  Changes of previous right-sided craniotomy.  IMPRESSION: No acute finding.  Old infarction in the right basal ganglia, insula and right frontoparietal region with atrophy encephalomalacia.  Chronic small vessel disease within the hemispheric white matter.  1.3 cm meningioma projecting rightward from the posterior falx, stable over time.  MRA HEAD  Findings: Both internal carotid arteries are patent into the brain. The anterior and middle cerebral vessels are patent without proximal stenosis, aneurysm or vascular malformation.  Both vertebral arteries are patent to the basilar.  No basilar stenosis. Posterior circulation branch vessels appear normal.  IMPRESSION: Normal intracranial MR angiography of the large and medium-sized vessels.  Original Report Authenticated By: Thomasenia Sales, M.D.   Mr Mra Head/brain Wo Cm  01/28/2012  *RADIOLOGY REPORT*  Clinical Data:  MRI HEAD WITHOUT CONTRAST MRA HEAD WITHOUT CONTRAST  Technique:  Multiplanar, multiecho pulse sequences of the brain and surrounding structures were obtained without intravenous contrast. Angiographic images of the head were obtained using MRA technique without contrast.  Comparison:  Head CT same day.  MRI 03/02/2006  MRI HEAD  Findings:  Diffusion imaging does not show any acute or subacute infarction.  There is Wallerian degeneration within the brainstem on the right but no sign of brain stem infarction.  No  cerebellar infarction.  The cerebral hemispheres show old infarction within the right basal ganglia, insula and frontoparietal region that has progressed atrophy and encephalomalacia with adjacent gliosis. There are some old blood products in that region.  The remainder the brain shows atrophy with old small vessel infarctions within the deep white matter.  No obstructive hydrocephalus.  No intra- axial mass lesion.  1.3 cm meningioma projecting rightward from the posterior falx is chronically unchanged and should not be of clinical relevance.  No extra-axial collection.  No pituitary mass. No inflammatory sinus disease.  Changes of previous right-sided craniotomy.  IMPRESSION: No acute finding.  Old infarction in the right basal ganglia, insula and right frontoparietal region with atrophy encephalomalacia.  Chronic small vessel disease within the hemispheric white matter.  1.3 cm meningioma projecting rightward from the posterior falx, stable over time.  MRA HEAD  Findings: Both internal carotid arteries are patent into the brain. The anterior and middle cerebral vessels are patent without proximal stenosis, aneurysm or vascular malformation.  Both vertebral arteries are patent to the basilar.  No basilar stenosis. Posterior circulation branch vessels appear normal.  IMPRESSION: Normal intracranial MR angiography of the large and medium-sized vessels.  Original Report Authenticated By: Thomasenia Sales, M.D.    Discharge Vitals & PE:  BP 123/78  Pulse 74  Temp(Src) 98.4 F (36.9 C) (Oral)  Resp 18  Ht 5' (1.524 m)  Wt 72.576 kg (160 lb)  BMI 31.25 kg/m2  SpO2 95%  LMP 09/30/2011 Alert oriented Dense hemiplegia on the left side No slurred speech Chest-clear S1-S2 no murmur rub or gallop  Discharge Labs: No results found for this or any previous visit (from the past 24 hour(s)).  Disposition and follow-up:   Ms.Kambryn A Peaden was discharged from in good condition.    Follow-up  Appointments: Neurologist in 3-5 weeks    Discharge Medications: Medication List  As of 01/29/2012 10:33 AM   TAKE these medications         amitriptyline 25 MG tablet   Commonly known as: ELAVIL   Take 25-50 mg by mouth at bedtime.      atorvastatin 20 MG tablet   Commonly known as: LIPITOR   Take 20 mg by mouth daily.      esomeprazole 40 MG capsule   Commonly known as: NEXIUM   Take 40 mg by mouth 2 (two) times daily.      gabapentin 300 MG capsule   Commonly known as: NEURONTIN   Take 300 mg by mouth every 8 (eight) hours as needed. For pain      levETIRAcetam 500 MG tablet   Commonly known as: KEPPRA   Take 1 tablet (500 mg total) by mouth 2 (two) times daily.      olmesartan 20 MG tablet   Commonly known as: BENICAR   Take 20 mg by mouth at bedtime.      oxyCODONE 5 MG immediate release tablet   Commonly known as: Oxy IR/ROXICODONE   Take 5 mg by mouth every 8 (eight) hours as needed. For pain      oxymorphone 40 MG 12 hr tablet   Commonly known as: OPANA ER   Take 40 mg by mouth every 12 (twelve) hours.      verapamil 240 MG CR tablet   Commonly known as: CALAN-SR   Take 240 mg by mouth at bedtime.         ASK your doctor about these medications         oxybutynin 10 MG 24 hr tablet   Commonly known as: DITROPAN-XL   Take 10 mg by mouth 2 (two) times daily.           Medications Discontinued During This Encounter  Medication Reason  . Oxymorphone HCl (OPANA ER PO) Inpatient Standard  . simvastatin (ZOCOR) tablet 40 mg Formulary change  . irbesartan (AVAPRO) tablet 75 mg   . verapamil (CALAN-SR) CR tablet 240 mg   . verapamil (CALAN-SR) 240 MG CR tablet Change in therapy  . olmesartan (BENICAR) 20 MG tablet Change in therapy      > 40 minutes time spent preparing d/c summary, including direct face-face patient Time, contact with consultants, family and care coordination   Signed: Caira Poche,JAI 01/29/2012, 10:14 AM

## 2012-05-27 ENCOUNTER — Other Ambulatory Visit (HOSPITAL_COMMUNITY): Payer: Self-pay | Admitting: Family Medicine

## 2012-05-27 DIAGNOSIS — Z1231 Encounter for screening mammogram for malignant neoplasm of breast: Secondary | ICD-10-CM

## 2012-06-09 ENCOUNTER — Ambulatory Visit (HOSPITAL_COMMUNITY)
Admission: RE | Admit: 2012-06-09 | Discharge: 2012-06-09 | Disposition: A | Discharge status: Home / Self Care | Payer: Medicare Other | Source: Ambulatory Visit | Attending: Family Medicine | Admitting: Family Medicine

## 2012-06-09 DIAGNOSIS — Z1231 Encounter for screening mammogram for malignant neoplasm of breast: Secondary | ICD-10-CM | POA: Insufficient documentation

## 2012-09-09 ENCOUNTER — Other Ambulatory Visit: Payer: Self-pay | Admitting: Pain Medicine

## 2012-09-09 DIAGNOSIS — M25562 Pain in left knee: Secondary | ICD-10-CM

## 2012-09-12 ENCOUNTER — Inpatient Hospital Stay: Admission: RE | Admit: 2012-09-12 | Payer: Medicare Other | Source: Ambulatory Visit

## 2012-09-18 ENCOUNTER — Ambulatory Visit
Admission: RE | Admit: 2012-09-18 | Discharge: 2012-09-18 | Disposition: A | Discharge status: Home / Self Care | Payer: Medicare Other | Source: Ambulatory Visit | Attending: Pain Medicine | Admitting: Pain Medicine

## 2012-09-18 DIAGNOSIS — M25562 Pain in left knee: Secondary | ICD-10-CM

## 2012-11-27 ENCOUNTER — Other Ambulatory Visit: Payer: Self-pay | Admitting: *Deleted

## 2012-11-27 NOTE — Telephone Encounter (Signed)
Patient is calling wanting a refill on Amitriptyline HCL 25 mg

## 2012-11-27 NOTE — Telephone Encounter (Signed)
According to our records, Keppra is the only drug we prescribe for her.

## 2012-11-28 NOTE — Telephone Encounter (Addendum)
Tried calling the patient no answer and was not able to leave a message. Wanted to let her know that the only medicine we prescribe here at [facility] is her Keppra. She will need to call the physician that put her on the Amitriptyline to get her refill.

## 2013-02-13 ENCOUNTER — Other Ambulatory Visit: Payer: Self-pay | Admitting: Family Medicine

## 2013-02-13 DIAGNOSIS — D320 Benign neoplasm of cerebral meninges: Secondary | ICD-10-CM

## 2013-02-21 ENCOUNTER — Encounter (HOSPITAL_COMMUNITY): Payer: Self-pay | Admitting: Emergency Medicine

## 2013-02-21 ENCOUNTER — Emergency Department (HOSPITAL_COMMUNITY): Payer: Medicare Other

## 2013-02-21 ENCOUNTER — Inpatient Hospital Stay (HOSPITAL_COMMUNITY): Payer: Medicare Other

## 2013-02-21 ENCOUNTER — Inpatient Hospital Stay: Admission: RE | Admit: 2013-02-21 | Payer: Medicare Other | Source: Ambulatory Visit

## 2013-02-21 ENCOUNTER — Inpatient Hospital Stay (HOSPITAL_COMMUNITY)
Admission: EM | Admit: 2013-02-21 | Discharge: 2013-03-03 | DRG: 871 | Disposition: A | Discharge status: Home Health | Payer: Medicare Other | Attending: Internal Medicine | Admitting: Internal Medicine

## 2013-02-21 DIAGNOSIS — J690 Pneumonitis due to inhalation of food and vomit: Secondary | ICD-10-CM | POA: Diagnosis not present

## 2013-02-21 DIAGNOSIS — E7800 Pure hypercholesterolemia, unspecified: Secondary | ICD-10-CM | POA: Diagnosis present

## 2013-02-21 DIAGNOSIS — E876 Hypokalemia: Secondary | ICD-10-CM | POA: Diagnosis not present

## 2013-02-21 DIAGNOSIS — Z9181 History of falling: Secondary | ICD-10-CM

## 2013-02-21 DIAGNOSIS — R404 Transient alteration of awareness: Secondary | ICD-10-CM

## 2013-02-21 DIAGNOSIS — A498 Other bacterial infections of unspecified site: Secondary | ICD-10-CM | POA: Diagnosis present

## 2013-02-21 DIAGNOSIS — W19XXXS Unspecified fall, sequela: Secondary | ICD-10-CM

## 2013-02-21 DIAGNOSIS — G92 Toxic encephalopathy: Secondary | ICD-10-CM | POA: Diagnosis present

## 2013-02-21 DIAGNOSIS — T40605A Adverse effect of unspecified narcotics, initial encounter: Secondary | ICD-10-CM | POA: Diagnosis present

## 2013-02-21 DIAGNOSIS — R339 Retention of urine, unspecified: Secondary | ICD-10-CM | POA: Diagnosis not present

## 2013-02-21 DIAGNOSIS — Z8673 Personal history of transient ischemic attack (TIA), and cerebral infarction without residual deficits: Secondary | ICD-10-CM

## 2013-02-21 DIAGNOSIS — N179 Acute kidney failure, unspecified: Secondary | ICD-10-CM | POA: Diagnosis present

## 2013-02-21 DIAGNOSIS — N12 Tubulo-interstitial nephritis, not specified as acute or chronic: Secondary | ICD-10-CM | POA: Diagnosis present

## 2013-02-21 DIAGNOSIS — I5189 Other ill-defined heart diseases: Secondary | ICD-10-CM

## 2013-02-21 DIAGNOSIS — W19XXXA Unspecified fall, initial encounter: Secondary | ICD-10-CM

## 2013-02-21 DIAGNOSIS — I214 Non-ST elevation (NSTEMI) myocardial infarction: Secondary | ICD-10-CM | POA: Diagnosis not present

## 2013-02-21 DIAGNOSIS — A419 Sepsis, unspecified organism: Principal | ICD-10-CM | POA: Diagnosis present

## 2013-02-21 DIAGNOSIS — R0902 Hypoxemia: Secondary | ICD-10-CM

## 2013-02-21 DIAGNOSIS — I5041 Acute combined systolic (congestive) and diastolic (congestive) heart failure: Secondary | ICD-10-CM | POA: Diagnosis present

## 2013-02-21 DIAGNOSIS — R6520 Severe sepsis without septic shock: Secondary | ICD-10-CM | POA: Diagnosis present

## 2013-02-21 DIAGNOSIS — G894 Chronic pain syndrome: Secondary | ICD-10-CM | POA: Diagnosis present

## 2013-02-21 DIAGNOSIS — R569 Unspecified convulsions: Secondary | ICD-10-CM | POA: Diagnosis present

## 2013-02-21 DIAGNOSIS — G929 Unspecified toxic encephalopathy: Secondary | ICD-10-CM | POA: Diagnosis present

## 2013-02-21 DIAGNOSIS — G40909 Epilepsy, unspecified, not intractable, without status epilepticus: Secondary | ICD-10-CM | POA: Diagnosis present

## 2013-02-21 DIAGNOSIS — K219 Gastro-esophageal reflux disease without esophagitis: Secondary | ICD-10-CM | POA: Diagnosis present

## 2013-02-21 DIAGNOSIS — R197 Diarrhea, unspecified: Secondary | ICD-10-CM | POA: Diagnosis not present

## 2013-02-21 DIAGNOSIS — I959 Hypotension, unspecified: Secondary | ICD-10-CM

## 2013-02-21 DIAGNOSIS — N318 Other neuromuscular dysfunction of bladder: Secondary | ICD-10-CM | POA: Diagnosis present

## 2013-02-21 DIAGNOSIS — E2749 Other adrenocortical insufficiency: Secondary | ICD-10-CM | POA: Diagnosis present

## 2013-02-21 DIAGNOSIS — D72829 Elevated white blood cell count, unspecified: Secondary | ICD-10-CM | POA: Diagnosis present

## 2013-02-21 DIAGNOSIS — I10 Essential (primary) hypertension: Secondary | ICD-10-CM | POA: Diagnosis present

## 2013-02-21 DIAGNOSIS — N390 Urinary tract infection, site not specified: Secondary | ICD-10-CM

## 2013-02-21 DIAGNOSIS — I739 Peripheral vascular disease, unspecified: Secondary | ICD-10-CM | POA: Diagnosis present

## 2013-02-21 DIAGNOSIS — G4700 Insomnia, unspecified: Secondary | ICD-10-CM | POA: Diagnosis present

## 2013-02-21 DIAGNOSIS — R4182 Altered mental status, unspecified: Secondary | ICD-10-CM | POA: Diagnosis present

## 2013-02-21 DIAGNOSIS — G589 Mononeuropathy, unspecified: Secondary | ICD-10-CM | POA: Diagnosis present

## 2013-02-21 DIAGNOSIS — I509 Heart failure, unspecified: Secondary | ICD-10-CM

## 2013-02-21 DIAGNOSIS — I69959 Hemiplegia and hemiparesis following unspecified cerebrovascular disease affecting unspecified side: Secondary | ICD-10-CM

## 2013-02-21 DIAGNOSIS — Z79899 Other long term (current) drug therapy: Secondary | ICD-10-CM

## 2013-02-21 LAB — LACTIC ACID, PLASMA: Lactic Acid, Venous: 0.6 mmol/L (ref 0.5–2.2)

## 2013-02-21 LAB — MRSA PCR SCREENING: MRSA by PCR: NEGATIVE

## 2013-02-21 LAB — BASIC METABOLIC PANEL
GFR calc Af Amer: 30 mL/min — ABNORMAL LOW (ref >90)
GFR calc non Af Amer: 26 mL/min — ABNORMAL LOW (ref >90)
Glucose, Bld: 114 mg/dL — ABNORMAL HIGH (ref 70–99)
Potassium: 4.8 mEq/L (ref 3.5–5.1)
Sodium: 133 mEq/L — ABNORMAL LOW (ref 135–145)

## 2013-02-21 LAB — URINALYSIS, ROUTINE W REFLEX MICROSCOPIC
Nitrite: POSITIVE — AB
Specific Gravity, Urine: 1.02 (ref 1.005–1.030)
Urobilinogen, UA: 0.2 mg/dL (ref 0.0–1.0)

## 2013-02-21 LAB — RAPID URINE DRUG SCREEN, HOSP PERFORMED
Cocaine: NOT DETECTED
Opiates: NOT DETECTED

## 2013-02-21 LAB — URINE MICROSCOPIC-ADD ON

## 2013-02-21 LAB — CBC WITH DIFFERENTIAL/PLATELET
Basophils Relative: 0 % (ref 0–1)
Eosinophils Absolute: 0.3 10*3/uL (ref 0.0–0.7)
Lymphs Abs: 2.4 10*3/uL (ref 0.7–4.0)
MCH: 24.9 pg — ABNORMAL LOW (ref 26.0–34.0)
Neutro Abs: 8.2 10*3/uL — ABNORMAL HIGH (ref 1.7–7.7)
Neutrophils Relative %: 70 % (ref 43–77)
Platelets: 317 10*3/uL (ref 150–400)
RBC: 3.69 MIL/uL — ABNORMAL LOW (ref 3.87–5.11)

## 2013-02-21 MED ORDER — SODIUM CHLORIDE 0.9 % IV BOLUS (SEPSIS)
2000.0000 mL | Freq: Once | INTRAVENOUS | Status: AC
  Administered 2013-02-21: 2000 mL via INTRAVENOUS

## 2013-02-21 MED ORDER — SODIUM CHLORIDE 0.9 % IJ SOLN
3.0000 mL | Freq: Two times a day (BID) | INTRAMUSCULAR | Status: DC
  Administered 2013-02-21 – 2013-03-02 (×13): 3 mL via INTRAVENOUS

## 2013-02-21 MED ORDER — SODIUM CHLORIDE 0.9 % IV BOLUS (SEPSIS)
1000.0000 mL | Freq: Once | INTRAVENOUS | Status: AC
  Administered 2013-02-21: 1000 mL via INTRAVENOUS

## 2013-02-21 MED ORDER — PANTOPRAZOLE SODIUM 40 MG PO TBEC
40.0000 mg | DELAYED_RELEASE_TABLET | Freq: Two times a day (BID) | ORAL | Status: DC
  Administered 2013-02-21 – 2013-03-03 (×21): 40 mg via ORAL
  Filled 2013-02-21 (×20): qty 1

## 2013-02-21 MED ORDER — DEXTROSE 5 % IV SOLN
1.0000 g | INTRAVENOUS | Status: DC
  Administered 2013-02-21 – 2013-02-23 (×3): 1 g via INTRAVENOUS
  Filled 2013-02-21 (×3): qty 10

## 2013-02-21 MED ORDER — OXYBUTYNIN CHLORIDE ER 10 MG PO TB24
10.0000 mg | ORAL_TABLET | Freq: Two times a day (BID) | ORAL | Status: DC
  Administered 2013-02-21 – 2013-03-03 (×21): 10 mg via ORAL
  Filled 2013-02-21 (×23): qty 1

## 2013-02-21 MED ORDER — HEPARIN SODIUM (PORCINE) 5000 UNIT/ML IJ SOLN
5000.0000 [IU] | Freq: Three times a day (TID) | INTRAMUSCULAR | Status: DC
  Administered 2013-02-21 – 2013-03-02 (×28): 5000 [IU] via SUBCUTANEOUS
  Filled 2013-02-21 (×33): qty 1

## 2013-02-21 MED ORDER — DEXTROSE 5 % IV SOLN
1.0000 g | Freq: Once | INTRAVENOUS | Status: AC
  Administered 2013-02-21: 1 g via INTRAVENOUS
  Filled 2013-02-21: qty 10

## 2013-02-21 MED ORDER — LEVETIRACETAM 500 MG PO TABS
500.0000 mg | ORAL_TABLET | Freq: Two times a day (BID) | ORAL | Status: DC
  Administered 2013-02-21 – 2013-03-03 (×21): 500 mg via ORAL
  Filled 2013-02-21 (×22): qty 1

## 2013-02-21 MED ORDER — ACETAMINOPHEN 650 MG RE SUPP: 650.0000 mg | Freq: Four times a day (QID) | RECTAL | Status: DC | PRN

## 2013-02-21 MED ORDER — NALOXONE HCL 0.4 MG/ML IJ SOLN
INTRAMUSCULAR | Status: AC
  Administered 2013-02-21: 0.4 mg via INTRAMUSCULAR
  Filled 2013-02-21: qty 2

## 2013-02-21 MED ORDER — SODIUM CHLORIDE 0.9 % IV SOLN
INTRAVENOUS | Status: DC
  Administered 2013-02-21: 09:00:00 via INTRAVENOUS
  Administered 2013-02-21: 1000 mL via INTRAVENOUS
  Administered 2013-02-22 – 2013-02-23 (×2): via INTRAVENOUS

## 2013-02-21 MED ORDER — ALUM & MAG HYDROXIDE-SIMETH 200-200-20 MG/5ML PO SUSP: 30.0000 mL | Freq: Four times a day (QID) | ORAL | Status: DC | PRN

## 2013-02-21 MED ORDER — HYDROCORTISONE SOD SUCCINATE 100 MG IJ SOLR
100.0000 mg | Freq: Three times a day (TID) | INTRAMUSCULAR | Status: DC
  Administered 2013-02-21 – 2013-02-23 (×5): 100 mg via INTRAVENOUS
  Filled 2013-02-21 (×9): qty 2

## 2013-02-21 MED ORDER — SODIUM CHLORIDE 0.9 % IV BOLUS (SEPSIS)
500.0000 mL | Freq: Once | INTRAVENOUS | Status: AC
  Administered 2013-02-21: 500 mL via INTRAVENOUS

## 2013-02-21 MED ORDER — ACETAMINOPHEN 325 MG PO TABS
650.0000 mg | ORAL_TABLET | Freq: Four times a day (QID) | ORAL | Status: DC | PRN
  Administered 2013-02-21: 650 mg via ORAL
  Filled 2013-02-21 (×2): qty 2

## 2013-02-21 MED ORDER — ALBUTEROL SULFATE (5 MG/ML) 0.5% IN NEBU
2.5000 mg | INHALATION_SOLUTION | RESPIRATORY_TRACT | Status: DC | PRN
  Administered 2013-02-23 – 2013-02-25 (×2): 2.5 mg via RESPIRATORY_TRACT
  Filled 2013-02-21: qty 0.5

## 2013-02-21 MED ORDER — ONDANSETRON HCL 4 MG/2ML IJ SOLN
4.0000 mg | Freq: Four times a day (QID) | INTRAMUSCULAR | Status: DC | PRN
  Administered 2013-02-22 (×3): 4 mg via INTRAVENOUS
  Filled 2013-02-21 (×3): qty 2

## 2013-02-21 MED ORDER — NALOXONE HCL 0.4 MG/ML IJ SOLN
0.4000 mg | Freq: Once | INTRAMUSCULAR | Status: AC
  Administered 2013-02-21: 0.4 mg via INTRAMUSCULAR

## 2013-02-21 MED ORDER — ONDANSETRON HCL 4 MG PO TABS
4.0000 mg | ORAL_TABLET | Freq: Four times a day (QID) | ORAL | Status: DC | PRN
  Administered 2013-02-24: 4 mg via ORAL
  Filled 2013-02-21: qty 1

## 2013-02-21 NOTE — ED Notes (Signed)
Pt states that she needs to use the bathroom. Pt informed that she has to use a bedpan. Pt does not want to use bedpan at this time and states that she is going to hold it for now

## 2013-02-21 NOTE — H&P (Addendum)
PATIENT DETAILS Name: Jordan Rivas Age: 52 y.o. Sex: female Date of Birth: 06/28/1961 Admit Date: 02/21/2013 ZOX:WRUE,[id number], MD   CHIEF COMPLAINT:  Frequent falls  HPI: Jordan Rivas is a 52 y.o. 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 The patient claims that she fell approximately 3 times yesterday, she denies losing consciousness, she claims she just lost her balance.Unfortunately he to the falls were unwitnessed, however there is a history of urinary incontinence or tongue bite. When she fell the third time around 11:00 PM last night, she was brought to the emergency room, she was also noted to be lethargic and with some mild altered mental status. She is on chronic narcotics and Neurontin for pain. In the ED she was found to be slightly somnolent and arousable, pupils were found to be dilated and minimally reactive, she was also initially found to be hypotensive. She was given Narcan without significant improvement. A CT of the head was negative for acute pathology, she was found to have mild leukocytosis and a urinalysis somewhat consistent with a UTI. She was given 2 L of IV fluids bolus, with some improvement of the blood pressure to the low 100s systolic-90s systolic. I was subsequently called to admit this patient for further evaluation and treatment  During my evaluation, patient is awake and alert, she was not in any distress. She did claim that over the past 2 days she does have some dysuria and urinary frequency. She denies any hematuria. She denies any nausea, vomiting or diarrhea. She denies chest pain or abdominal pain. She does does claim to have mild right-sided flank pain. She denies any shortness of breath. She lives with her boyfriend who is out of town, her son was at bedside during my  evaluation.  ALLERGIES:   Allergies  Allergen Reactions  . Celebrex (Celecoxib) Swelling    "lips; it was terrible"    PAST MEDICAL HISTORY: Past Medical History  Diagnosis Date  . Hypertension   . Hypercholesteremia   . Insomnia   . Neuropathy   . GERD (gastroesophageal reflux disease)   . Overactive bladder   . Peripheral vascular disease     left arm and leg; since stroke 1995  . Seizures 01/27/12    "first one ever"  . CVA (cerebrovascular accident) 1995    "left side paralyzed"  . Chronic pain     "in this left arm and leg that don't move"    PAST SURGICAL HISTORY: Past Surgical History  Procedure Laterality Date  . Tubal ligation  04/1984  . Cerebral aneurysm repair  11/1993    "left my left side paralyzed"    MEDICATIONS AT HOME: Prior to Admission medications   Medication Sig Start Date End Date Taking? Authorizing Provider  amitriptyline (ELAVIL) 25 MG tablet Take 25-50 mg by mouth at bedtime.    Historical Provider, MD  atorvastatin (LIPITOR) 20 MG tablet Take 20 mg by mouth daily.    Historical Provider, MD  esomeprazole (NEXIUM) 40 MG capsule Take 40 mg by mouth 2 (two) times daily.    Historical Provider, MD  gabapentin (NEURONTIN) 300 MG capsule Take 300 mg by mouth every 8 (eight) hours as needed. For pain    Historical Provider, MD  levETIRAcetam (KEPPRA) 500 MG tablet Take 1 tablet (500 mg total) by mouth 2 (two) times daily. 01/29/12  01/28/13  Rhetta Mura, MD  olmesartan (BENICAR) 20 MG tablet Take 20 mg by mouth at bedtime.    Historical Provider, MD  oxybutynin (DITROPAN-XL) 10 MG 24 hr tablet Take 10 mg by mouth 2 (two) times daily.    Historical Provider, MD  oxyCODONE (OXY IR/ROXICODONE) 5 MG immediate release tablet Take 5 mg by mouth every 8 (eight) hours as needed. For pain    Historical Provider, MD  oxymorphone (OPANA ER) 40 MG 12 hr tablet Take 40 mg by mouth every 12 (twelve) hours.    Historical Provider, MD  verapamil (CALAN-SR) 240  MG CR tablet Take 240 mg by mouth at bedtime.    Historical Provider, MD    FAMILY HISTORY: Family History  Problem Relation Age of Onset  . Dementia Mother   . Diabetes type II Mother   . Heart attack Father     SOCIAL HISTORY:  reports that she has never smoked. She has never used smokeless tobacco. She reports that she does not drink alcohol or use illicit drugs.  REVIEW OF SYSTEMS:  Constitutional:   No  weight loss, night sweats,  Fevers, chills, fatigue.  HEENT:    No headaches, Difficulty swallowing,Tooth/dental problems,Sore throat,  No sneezing, itching, ear ache, nasal congestion, post nasal drip,   Cardio-vascular: No chest pain,  Orthopnea, PND, swelling in lower extremities, anasarca, dizziness, palpitations  GI:  No heartburn, indigestion, abdominal pain, nausea, vomiting, diarrhea, change in bowel habits, loss of appetite  Resp: No shortness of breath with exertion or at rest.  No excess mucus, no productive cough, No non-productive cough,  No coughing up of blood.No change in color of mucus.No wheezing.No chest wall deformity  Skin:  no rash or lesions.  GU:  No change in color of urine  Musculoskeletal: No joint pain or swelling.  No decreased range of motion.  No back pain.  Psych: No change in mood or affect. No depression or anxiety.  No memory loss.   PHYSICAL EXAM: Blood pressure 84/37, pulse 80, temperature 98 F (36.7 C), temperature source Oral, resp. rate 11, last menstrual period 09/30/2011, SpO2 97.00%.  General appearance :Awake, alert, not in any distress. Speech Clear. Not toxic Looking HEENT: Atraumatic and Normocephalic, pupils equally reactive to light and accomodation Neck: supple, no JVD. No cervical lymphadenopathy.  Chest:Good air entry bilaterally, no added sounds  CVS: S1 S2 regular, no murmurs.  Abdomen: Bowel sounds present, Non tender and not distended with no gaurding, rigidity or rebound. Mild right CVA  tenderness Extremities: B/L Lower Ext shows no edema, both legs are warm to touch Neurology: Awake alert, and oriented X 3, left sided hemi-lesion that is chronic.  Skin:No Rash Wounds:N/A  LABS ON ADMISSION:   Recent Labs  02/21/13 0139  NA 133*  K 4.8  CL 103  CO2 23  GLUCOSE 114*  BUN 35*  CREATININE 2.08*  CALCIUM 8.9   No results found for this basename: AST, ALT, ALKPHOS, BILITOT, PROT, ALBUMIN,  in the last 72 hours No results found for this basename: LIPASE, AMYLASE,  in the last 72 hours  Recent Labs  02/21/13 0139  WBC 11.8*  NEUTROABS 8.2*  HGB 9.2*  HCT 29.0*  MCV 78.6  PLT 317   No results found for this basename: CKTOTAL, CKMB, CKMBINDEX, TROPONINI,  in the last 72 hours No results found for this basename: DDIMER,  in the last 72 hours No components found with this basename: POCBNP,    RADIOLOGIC STUDIES  ON ADMISSION: Dg Chest 2 View  02/21/2013   *RADIOLOGY REPORT*  Clinical Data: Altered mental status.  Fall.  CHEST - 2 VIEW  Comparison: 01/28/2012  Findings: The heart, mediastinum and hila are unremarkable.  The lungs are clear.  No pleural effusion or pneumothorax.  The bony thorax is demineralized but intact.  IMPRESSION: No acute cardiopulmonary disease.   Original Report Authenticated By: Amie Portland, M.D.   Ct Head Wo Contrast  02/21/2013   *RADIOLOGY REPORT*  Clinical Data: Fall, left side paralysis from prior stroke, history hypertension, seizure  CT HEAD WITHOUT CONTRAST  Technique:  Contiguous axial images were obtained from the base of the skull through the vertex without contrast.  Comparison: 01/28/2012  Findings: Generalized atrophy. Ex vacuo dilatation of the right lateral ventricle again identified secondary to large area of right MCA infarction involving the right parietal lobe and basal ganglia. No midline shift or mass effect. Small vessel chronic ischemic changes of deep cerebral white matter. No intracranial hemorrhage or evidence of  acute infarction. No extra-axial fluid collections. Postsurgical changes of right frontoparietal craniotomy. Hyperdense nodule para falcine at the posterior right parietal region 11 x 9 mm consistent with meningioma, unchanged. Dense calcification within anterior falx. No new intracranial mass lesions.  IMPRESSION: Large old right MCA infarct. Mild small vessel chronic ischemic changes deep cerebral white matter. Stable right parafalcine meningioma posteriorly. Postsurgical changes right frontoparietal craniotomy. No new intracranial abnormalities.   Original Report Authenticated By: Ulyses Southward, M.D.   ASSESSMENT AND PLAN: Present on Admission:  . Sepsis associated hypotension - Suspect this may be secondary to UTI, will continue with empiric Rocephin, blood and urine cultures have been drawn, await cultures.  - Will aggressively hydrate, hopefully this will resolve hypotension, if not we will do further workup and may consult critical care  - Await lactic acid - Hold Benicar and verapamil, will hold all narcotics as well  . UTI (lower urinary tract infection)/right-sided pyelonephritis - Does have mild leukocytosis, however is afebrile. As noted above is hypotensive.  - Continue with Rocephin and await urine and blood cultures   . ARF (acute renal failure) - Likely prerenal, we'll check a renal ultrasound  - Will ask the nursing staff to bladder scan and see if any significant residual - however bladder not palpable on exam  - Will hold Benicar   . Altered mental status - Likely secondary to toxic metabolite encephalopathy from narcotics and renal failure. CT of the head is negative for acute abnormalities. Currently during my evaluation patient is awake and alert.  . Fall - Likely secondary to hypotension - CT of the head negative for acute abnormalities - For now IV fluids, PT eval to assess gait-depending on PT eval may need further workup.   . Seizure - Continue Keppra   . GERD  (gastroesophageal reflux disease) - Continue with PPI   . Hypercholesteremia - Who statins for now, resume and renal function much better   . Altered mental status - Likely secondary to toxic metabolite encephalopathy from narcotics and renal failure. CT of the head is negative. Currently doing my evaluation patient is awake and alert.  Further plan will depend as patient's clinical course evolves and further radiologic and laboratory data become available. Patient will be monitored closely.  DVT Prophylaxis: Prophylactic  Heparin  Code Status: Full Code  Total time spent for admission equals 45 minutes.  [facility] Pager [phone]  If 7PM-7AM, please contact night-coverage [email or web address] Password [facility] 02/21/2013,  8:16 AM

## 2013-02-21 NOTE — ED Notes (Signed)
Pt states that her family sent her here because she has been falling more recently. Pt has no complaints. Pt has left sided paralysis from previous stroke and uses a walker to walk

## 2013-02-21 NOTE — Progress Notes (Addendum)
Pt. Voided 175 cc urine; post void bladder scan showed 500 cc residual.  MD notified.  Instructed to re scan bladder in a few hours and notify MD of residual then.  Md also aware of patient's BP 80's/60's; another 500 cc bolus ordered. Will continue to monitor.  Vivi Martens RN

## 2013-02-21 NOTE — ED Provider Notes (Signed)
Medical screening examination/treatment/procedure(s) were performed by non-physician practitioner and as supervising physician I was immediately available for consultation/collaboration.   Brandt Loosen, MD 02/21/13 [phone]

## 2013-02-21 NOTE — Progress Notes (Signed)
eLink Physician-Brief Progress Note Patient Name: Jordan Rivas DOB: 1961-07-06 MRN: [id number]  Date of Service  02/21/2013   HPI/Events of Note  MAP improved buy still 60 with sb 85 after 2L fluid  eICU Interventions  2 more liter fluid IF after this, still bp low consider hydrocort (cortisol 9)      Zakira Ressel 02/21/2013, 5:31 PM

## 2013-02-21 NOTE — Progress Notes (Signed)
Pt. Has received a total of 4.5L of NS since coming to ED at 0100 last night.  Will continue to monitor.  Vivi Martens RN

## 2013-02-21 NOTE — Progress Notes (Addendum)
Pt's bp 83/35; MD made aware- orders received.  RR notified as well; Will continue to monitor.  Vivi Martens RN

## 2013-02-21 NOTE — Consult Note (Signed)
PULMONARY  / CRITICAL CARE MEDICINE  Name: Jordan Rivas MRN: [id number] DOB: June 07, 1961    ADMISSION DATE:  02/21/2013 CONSULTATION DATE:  02/21/13  REFERRING MD :  Jerral Ralph PRIMARY SERVICE:  [facility]  CHIEF COMPLAINT:  Hypotension   BRIEF PATIENT DESCRIPTION: 52 yo female with hx HTN, CVA with L hemiplegia, seizures admitted 6/21 by [facility] with UTI and AMS.  Pt with persistent hypotension despite fluids and PCCM consulted.    SIGNIFICANT EVENTS / STUDIES:  Renal u/s 6/21>>>Mild right renal cortical thinning. Exam is otherwise unremarkable. No hydronephrosis. CT head 6/21>>>Large old right MCA infarct. Mild small vessel chronic ischemic changes deep cerebral white matter. Stable right parafalcine meningioma posteriorly. Postsurgical changes right frontoparietal craniotomy. No new intracranial abnormalities.  LINES / TUBES: none  CULTURES: Urine 6/21>>> BCx2 6/21>>>  ANTIBIOTICS: Rocephin 6/21>>>  HISTORY OF PRESENT ILLNESS:  52yo female with hx CVA with L hemiplegia, HTN, chronic pain, seizures presented 6/21 with lethargy, AMS per sonl, and increased falls.  Work-up revealed UTI, neg head CT and pt was admitted for IV abx and fluids.  She was hypotensive and remains so despite ~4L fluids and PCCM consulted.  She says she is feeling much better and per son she is much improved. She c/o mild dysuria, denies recent sick contacts, fevers, chills, abd pain.    PAST MEDICAL HISTORY :  Past Medical History  Diagnosis Date  . Hypertension   . Hypercholesteremia   . Insomnia   . Neuropathy   . GERD (gastroesophageal reflux disease)   . Overactive bladder   . Peripheral vascular disease     left arm and leg; since stroke 1995  . Seizures 01/27/12    "first one ever"  . CVA (cerebrovascular accident) 1995    "left side paralyzed"  . Chronic pain     "in this left arm and leg that don't move"   Past Surgical History  Procedure Laterality Date  . Tubal ligation  04/1984  .  Cerebral aneurysm repair  11/1993    "left my left side paralyzed"   Prior to Admission medications   Medication Sig Start Date End Date Taking? Authorizing Provider  amitriptyline (ELAVIL) 25 MG tablet Take 25-50 mg by mouth at bedtime.   Yes Historical Provider, MD  atorvastatin (LIPITOR) 20 MG tablet Take 20 mg by mouth daily.   Yes Historical Provider, MD  DULoxetine (CYMBALTA) 60 MG capsule Take 60 mg by mouth daily.   Yes Historical Provider, MD  esomeprazole (NEXIUM) 40 MG capsule Take 40 mg by mouth 2 (two) times daily.   Yes Historical Provider, MD  olmesartan (BENICAR) 20 MG tablet Take 20 mg by mouth at bedtime.   Yes Historical Provider, MD  oxybutynin (DITROPAN-XL) 10 MG 24 hr tablet Take 10 mg by mouth 2 (two) times daily.   Yes Historical Provider, MD  oxyCODONE (OXY IR/ROXICODONE) 5 MG immediate release tablet Take 5 mg by mouth every 8 (eight) hours as needed. For pain   Yes Historical Provider, MD  pregabalin (LYRICA) 75 MG capsule Take 75 mg by mouth 2 (two) times daily.   Yes Historical Provider, MD  verapamil (CALAN-SR) 240 MG CR tablet Take 240 mg by mouth at bedtime.   Yes Historical Provider, MD  levETIRAcetam (KEPPRA) 500 MG tablet Take 1 tablet (500 mg total) by mouth 2 (two) times daily. 01/29/12 01/28/13  Rhetta Mura, MD   Allergies  Allergen Reactions  . Celebrex (Celecoxib) Swelling    "lips; it was terrible"  FAMILY HISTORY:  Family History  Problem Relation Age of Onset  . Dementia Mother   . Diabetes type II Mother   . Heart attack Father    SOCIAL HISTORY:  reports that she has never smoked. She has never used smokeless tobacco. She reports that she does not drink alcohol or use illicit drugs.  REVIEW OF SYSTEMS:   Per HPI - all other systems reviewed and were neg.   VITAL SIGNS: Temp:  [98 F (36.7 C)-98.3 F (36.8 C)] 98 F (36.7 C) (06/21 0542) Pulse Rate:  [80-97] 85 (06/21 1400) Resp:  [10-20] 13 (06/21 1400) BP: (79-111)/(30-80)  90/48 mmHg (06/21 1400) SpO2:  [94 %-100 %] 100 % (06/21 1400)  PHYSICAL EXAMINATION: General:  Chronically ill appearing female, NAD  Neuro:  Sleepy but easily arousable, appropriate, L sided hemiplegia HEENT:  Mm dry, no JVD Cardiovascular:  s1s2 rrr Lungs:  resps even non labored on [place] Abdomen:  Soft, non tender Musculoskeletal:  L arm contracted, no BLE edema   Recent Labs Lab 02/21/13 0139  NA 133*  K 4.8  CL 103  CO2 23  BUN 35*  CREATININE 2.08*  GLUCOSE 114*    Recent Labs Lab 02/21/13 0139  HGB 9.2*  HCT 29.0*  WBC 11.8*  PLT 317   Dg Chest 2 View  02/21/2013   *RADIOLOGY REPORT*  Clinical Data: Altered mental status.  Fall.  CHEST - 2 VIEW  Comparison: 01/28/2012  Findings: The heart, mediastinum and hila are unremarkable.  The lungs are clear.  No pleural effusion or pneumothorax.  The bony thorax is demineralized but intact.  IMPRESSION: No acute cardiopulmonary disease.   Original Report Authenticated By: Amie Portland, M.D.   Ct Head Wo Contrast  02/21/2013   *RADIOLOGY REPORT*  Clinical Data: Fall, left side paralysis from prior stroke, history hypertension, seizure  CT HEAD WITHOUT CONTRAST  Technique:  Contiguous axial images were obtained from the base of the skull through the vertex without contrast.  Comparison: 01/28/2012  Findings: Generalized atrophy. Ex vacuo dilatation of the right lateral ventricle again identified secondary to large area of right MCA infarction involving the right parietal lobe and basal ganglia. No midline shift or mass effect. Small vessel chronic ischemic changes of deep cerebral white matter. No intracranial hemorrhage or evidence of acute infarction. No extra-axial fluid collections. Postsurgical changes of right frontoparietal craniotomy. Hyperdense nodule para falcine at the posterior right parietal region 11 x 9 mm consistent with meningioma, unchanged. Dense calcification within anterior falx. No new intracranial mass lesions.   IMPRESSION: Large old right MCA infarct. Mild small vessel chronic ischemic changes deep cerebral white matter. Stable right parafalcine meningioma posteriorly. Postsurgical changes right frontoparietal craniotomy. No new intracranial abnormalities.   Original Report Authenticated By: Ulyses Southward, M.D.   US Renal  02/21/2013   *RADIOLOGY REPORT*  Clinical Data: Acute renal failure  RENAL/URINARY TRACT ULTRASOUND COMPLETE  Comparison:  CT, 11/03/2010  Findings:  Right Kidney:  Right kidney is normal in overall size and echogenicity.  There is mild renal cortical thinning.  No renal masses or stones.  No hydronephrosis.  The right kidney measures 9.9 cm.  Left Kidney:  Left kidney is normal in overall size echogenicity. No left renal masses or stones or hydronephrosis.  Left kidney measures 10.2 cm.  Bladder:  Unremarkable  IMPRESSION: Mild right renal cortical thinning.  Exam is otherwise unremarkable.  No hydronephrosis.   Original Report Authenticated By: Amie Portland, M.D.    ASSESSMENT /  PLAN:  UTI  Sepsis/ Hypotension - r/t UTI.  AMS - r/t sepsis/hypotension.  Much improved. CT head neg.  Acute renal failure - prerenal in setting sepsis, dehydration   PLAN -  Cont aggressive volume resuscitation  F/u lactate, pct  Consider tress steroids  If hypotension persists may need CVL and pressors - hold for now  Abx as above  Hold anti-HTN  Hold narcs    WHITEHEART,KATHRYN, NP 02/21/2013  2:20 PM Pager: [phone] or [phone]  *Care during the described time interval was provided by me and/or other providers on the critical care team. I have reviewed this patient's available data, including medical history, events of note, physical examination and test results as part of my evaluation.   Reviewed above, examined pt, and agree with assessment/plan.  She has severe sepsis, acute renal failure, and sepsis encephalopathy most likely from UTI.  Lactic acid 0.6.  Recommend continuing IV  fluid resuscitation and current antibiotics.  Agree with checking cortisol for relative adrenal insufficiency.  No indication for central venous catheter, pressor agents, or transfer to ICU at this time.  Coralyn Helling, MD [facility] Pulmonary/Critical Care 02/21/2013, 3:23 PM Pager:  [phone] After 3pm call: [phone]

## 2013-02-21 NOTE — ED Notes (Signed)
Admitting MD at bedside.

## 2013-02-21 NOTE — Progress Notes (Signed)
BP manual 82/50; MD paged; 2 L total received in ED; new orders to give third bolus of 1,000 cc.  Pt. Is alert and oriented at this time.  Call light within reach, family at bedside, pt. Has left sided weakness from past stroke.  Will continue to monitor.  Vivi Martens RN

## 2013-02-21 NOTE — ED Provider Notes (Signed)
History     CSN: [id number]  Arrival date & time 02/21/13  0001   First MD Initiated Contact with Patient 02/21/13 0003      Chief Complaint  Patient presents with  . Fall    (Consider location/radiation/quality/duration/timing/severity/associated sxs/prior treatment) HPI .History provided by patient's son.  Patient has h/o CVA in 1994 w/ residual L-sided paresis.  Walks w/ a cane.  Has been having falls recently, a total of three yesterday.  Each unwitnessed and etiology unknown.  No recent illnesses, medication changes or head injuries.  Does not abuse drugs/alcohol.  Takes opana and neurontin for pain.  Pt lives with her boyfriend who is currently out of town and family has been staying with her overnight. Past Medical History  Diagnosis Date  . Hypertension   . Hypercholesteremia   . Insomnia   . Neuropathy   . GERD (gastroesophageal reflux disease)   . Overactive bladder   . Peripheral vascular disease     left arm and leg; since stroke 1995  . Seizures 01/27/12    "first one ever"  . CVA (cerebrovascular accident) 1995    "left side paralyzed"  . Chronic pain     "in this left arm and leg that don't move"    Past Surgical History  Procedure Laterality Date  . Tubal ligation  04/1984  . Cerebral aneurysm repair  11/1993    "left my left side paralyzed"    Family History  Problem Relation Age of Onset  . Dementia Mother   . Diabetes type II Mother   . Heart attack Father     History  Substance Use Topics  . Smoking status: Never Smoker   . Smokeless tobacco: Never Used  . Alcohol Use: No    OB History   Grav Para Term Preterm Abortions TAB SAB Ect Mult Living   2 2 2              Review of Systems  All other systems reviewed and are negative.    Allergies  Celebrex  Home Medications   Current Outpatient Rx  Name  Route  Sig  Dispense  Refill  . amitriptyline (ELAVIL) 25 MG tablet   Oral   Take 25-50 mg by mouth at bedtime.         Marland Kitchen  atorvastatin (LIPITOR) 20 MG tablet   Oral   Take 20 mg by mouth daily.         Marland Kitchen esomeprazole (NEXIUM) 40 MG capsule   Oral   Take 40 mg by mouth 2 (two) times daily.         Marland Kitchen gabapentin (NEURONTIN) 300 MG capsule   Oral   Take 300 mg by mouth every 8 (eight) hours as needed. For pain         . EXPIRED: levETIRAcetam (KEPPRA) 500 MG tablet   Oral   Take 1 tablet (500 mg total) by mouth 2 (two) times daily.   60 tablet   2   . olmesartan (BENICAR) 20 MG tablet   Oral   Take 20 mg by mouth at bedtime.         Marland Kitchen oxybutynin (DITROPAN-XL) 10 MG 24 hr tablet   Oral   Take 10 mg by mouth 2 (two) times daily.         Marland Kitchen oxyCODONE (OXY IR/ROXICODONE) 5 MG immediate release tablet   Oral   Take 5 mg by mouth every 8 (eight) hours as needed. For pain         .  oxymorphone (OPANA ER) 40 MG 12 hr tablet   Oral   Take 40 mg by mouth every 12 (twelve) hours.         . verapamil (CALAN-SR) 240 MG CR tablet   Oral   Take 240 mg by mouth at bedtime.           BP 82/42  Pulse 88  Temp(Src) 98 F (36.7 C) (Oral)  Resp 20  SpO2 97%  LMP 09/30/2011  Physical Exam  Nursing note and vitals reviewed. Constitutional: She appears well-developed and well-nourished. No distress.  obese  HENT:  Head: Normocephalic and atraumatic.  Mouth/Throat: Oropharynx is clear and moist.  Eyes:  Normal appearance  Neck: Normal range of motion.  Cardiovascular: Normal rate and regular rhythm.   hypotensive  Pulmonary/Chest: Effort normal and breath sounds normal. No respiratory distress.  Abdominal: Soft. Bowel sounds are normal. She exhibits no distension.  Musculoskeletal: Normal range of motion.  Neurological:  Somnolent but arousible.  Speech slurred.  Follow commands.  Pupils dilated and minimally reactive.    Skin: Skin is warm and dry. No rash noted.  Psychiatric: She has a normal mood and affect. Her behavior is normal.    ED Course  Procedures (including critical  care time)  Labs Reviewed  CBC WITH DIFFERENTIAL - Abnormal; Notable for the following:    WBC 11.8 (*)    RBC 3.69 (*)    Hemoglobin 9.2 (*)    HCT 29.0 (*)    MCH 24.9 (*)    Neutro Abs 8.2 (*)    All other components within normal limits  BASIC METABOLIC PANEL - Abnormal; Notable for the following:    Sodium 133 (*)    Glucose, Bld 114 (*)    BUN 35 (*)    Creatinine, Ser 2.08 (*)    GFR calc non Af Amer 26 (*)    GFR calc Af Amer 30 (*)    All other components within normal limits  URINALYSIS, ROUTINE W REFLEX MICROSCOPIC - Abnormal; Notable for the following:    APPearance CLOUDY (*)    Hgb urine dipstick TRACE (*)    Nitrite POSITIVE (*)    Leukocytes, UA SMALL (*)    All other components within normal limits  URINE MICROSCOPIC-ADD ON - Abnormal; Notable for the following:    Bacteria, UA MANY (*)    Casts HYALINE CASTS (*)    All other components within normal limits  URINE CULTURE  URINE RAPID DRUG SCREEN (HOSP PERFORMED)  ETHANOL  GLUCOSE, CAPILLARY   Ct Head Wo Contrast  02/21/2013   *RADIOLOGY REPORT*  Clinical Data: Fall, left side paralysis from prior stroke, history hypertension, seizure  CT HEAD WITHOUT CONTRAST  Technique:  Contiguous axial images were obtained from the base of the skull through the vertex without contrast.  Comparison: 01/28/2012  Findings: Generalized atrophy. Ex vacuo dilatation of the right lateral ventricle again identified secondary to large area of right MCA infarction involving the right parietal lobe and basal ganglia. No midline shift or mass effect. Small vessel chronic ischemic changes of deep cerebral white matter. No intracranial hemorrhage or evidence of acute infarction. No extra-axial fluid collections. Postsurgical changes of right frontoparietal craniotomy. Hyperdense nodule para falcine at the posterior right parietal region 11 x 9 mm consistent with meningioma, unchanged. Dense calcification within anterior falx. No new  intracranial mass lesions.  IMPRESSION: Large old right MCA infarct. Mild small vessel chronic ischemic changes deep cerebral white matter. Stable right  parafalcine meningioma posteriorly. Postsurgical changes right frontoparietal craniotomy. No new intracranial abnormalities.   Original Report Authenticated By: Ulyses Southward, M.D.     1. UTI (lower urinary tract infection)   2. Hypotension   3. Acute kidney injury   4. Altered level of consciousness       MDM  52yo F w/ L sided paresis s/p CVA in 1994, presents w/ several recent falls, 3 in last 24 hours.  On exam, somnolent but arousible, slurred speech, following commands, afebrile, hypotensive.  No improvement w/ narcan.  CT head negative for acute pathology and labs sig for UTI and AKI, likely prerenal.  Lactate and blood cultures pending.  Current BP 82/42.  Pt receiving 3rd liter bolus and has received IV rocephin as well.  Will admit for UTI, AKI and hypotension.  6:18 AM   Pt awake and oriented.  Normotensive.  6:19 AM        Otilio Miu, PA-C 02/21/13 [phone]

## 2013-02-21 NOTE — Progress Notes (Signed)
Pt. Currently receiving another bolus; total once complete will be 8,500 cc.  Will continue to monitor.  Vivi Martens RN

## 2013-02-21 NOTE — ED Notes (Signed)
Pt is alert and oriented and states since stroke left side is flaccid.  Pt states she was trying to get up and leg slipped and she hit nite stand.  Pt bp has been low and she is finishing NS bolus.  Pt placed on monitor and showed to be in NSR 83 on monitor.  Pt complains of lower abdominal pain and states that may be from people picking her up.  Waiting for lab to draw blood, previous nurse called.

## 2013-02-21 NOTE — Significant Event (Signed)
Pt remains with borderline blood pressure.  Mental status improved.  Making urine.  Cortisol noted to be 9.9.  Will continue IV fluid resuscitation and start stress dose steroids.  No indication for central venous access or pressor agents at this time.  Coralyn Helling, MD [facility] Pulmonary/Critical Care 02/21/2013, 6:18 PM Pager:  [phone] After 3pm call: [phone]

## 2013-02-21 NOTE — Progress Notes (Signed)
eLink Physician-Brief Progress Note Patient Name: Jordan Rivas DOB: 01/22/1961 MRN: [id number]  Date of Service  02/21/2013   HPI/Events of Note   MAP 52. D.w Dr Craige Cotta. HE says more room to give fluids though patient has gotten 4L fluid so far   Recent Labs Lab 02/21/13 0653  LATICACIDVEN 0.6    Recent Labs Lab 02/21/13 0139  NA 133*  K 4.8  CL 103  CO2 23  GLUCOSE 114*  BUN 35*  CREATININE 2.08*  CALCIUM 8.9   No results found for this basename: PROBNP,  in the last 168 hours  No results found for this basename: TROPONINI,  in the last 168 hours   eICU Interventions  2L IV fluids bolus      Annalaya Wile 02/21/2013, 3:44 PM

## 2013-02-22 DIAGNOSIS — R4182 Altered mental status, unspecified: Secondary | ICD-10-CM

## 2013-02-22 DIAGNOSIS — A419 Sepsis, unspecified organism: Secondary | ICD-10-CM

## 2013-02-22 LAB — COMPREHENSIVE METABOLIC PANEL
ALT: 23 U/L (ref 0–35)
AST: 21 U/L (ref 0–37)
Alkaline Phosphatase: 93 U/L (ref 39–117)
CO2: 22 mEq/L (ref 19–32)
Chloride: 114 mEq/L — ABNORMAL HIGH (ref 96–112)
GFR calc Af Amer: >90 mL/min (ref >90)
GFR calc non Af Amer: 80 mL/min — ABNORMAL LOW (ref >90)
Glucose, Bld: 113 mg/dL — ABNORMAL HIGH (ref 70–99)
Potassium: 4.1 mEq/L (ref 3.5–5.1)
Sodium: 142 mEq/L (ref 135–145)

## 2013-02-22 LAB — CBC
MCH: 25.4 pg — ABNORMAL LOW (ref 26.0–34.0)
MCHC: 31.8 g/dL (ref 30.0–36.0)
RDW: 15.5 % (ref 11.5–15.5)

## 2013-02-22 MED ORDER — PREGABALIN 50 MG PO CAPS
75.0000 mg | ORAL_CAPSULE | Freq: Two times a day (BID) | ORAL | Status: DC
  Administered 2013-02-22 – 2013-03-03 (×19): 75 mg via ORAL
  Filled 2013-02-22 (×2): qty 1
  Filled 2013-02-22 (×2): qty 3
  Filled 2013-02-22 (×7): qty 1
  Filled 2013-02-22: qty 3
  Filled 2013-02-22 (×8): qty 1

## 2013-02-22 MED ORDER — ENSURE PUDDING PO PUDG
1.0000 | Freq: Three times a day (TID) | ORAL | Status: DC
  Administered 2013-02-22 – 2013-03-01 (×6): 1 via ORAL

## 2013-02-22 MED ORDER — DULOXETINE HCL 60 MG PO CPEP
60.0000 mg | ORAL_CAPSULE | Freq: Every day | ORAL | Status: DC
  Administered 2013-02-22 – 2013-03-03 (×10): 60 mg via ORAL
  Filled 2013-02-22 (×10): qty 1

## 2013-02-22 MED ORDER — ATORVASTATIN CALCIUM 20 MG PO TABS
20.0000 mg | ORAL_TABLET | Freq: Every evening | ORAL | Status: DC
  Administered 2013-02-22 – 2013-03-02 (×9): 20 mg via ORAL
  Filled 2013-02-22 (×10): qty 1

## 2013-02-22 MED ORDER — AMITRIPTYLINE HCL 25 MG PO TABS: 25.0000 mg | ORAL_TABLET | Freq: Every day | ORAL | Status: DC

## 2013-02-22 NOTE — Progress Notes (Signed)
02/22/2013 11:48 AM  Patient transferring to [id number].  Report called to [place]. Jaclyn Shaggy Everhart

## 2013-02-22 NOTE — Progress Notes (Addendum)
PATIENT DETAILS Name: Jordan Rivas Age: 52 y.o. Sex: female Date of Birth: 1961/03/25 Admit Date: 02/21/2013 Admitting Physician Ron Parker, MD ZOX:[id number], MD  Subjective: Feels better  Assessment/Plan: Principal Problem:   Sepsis associated hypotension -patient was admitted to the SDU and given fluid boluses, however was still hypotensive, cortisol level was checked-it was 9-given stress dose hydrocortisone with good response. -much improved after starting hydrocortisone-BP now back to normal- will decrease hydrocortisone to 50 mg IV 3 times a day, will then we'll transition to oral prednisone and slowly taper it off -c/w Rocephin - Urine culture shows pansensitive Escherichia coli-continue with Rocephin for - Blood cultures 6/21-negative so far  Active Problems: UTI (lower urinary tract infection)/right-sided pyelonephritis -causing above -c/w Rocephin   Relative Adrenal Insufficiency -cortisol inappropriately low normal in the setting of hypotension -taper off Hydrocortisone slowly  ARF (acute renal failure)  - Likely prerenal -resolved -renal ultrasound-neg for hydronephrosis  Altered mental status  -resolved - Likely secondary to toxic metabolite encephalopathy from narcotics and renal failure. CT of the head is negative for acute abnormalities.   Acute urinary retention  - Remove Foley and see if patient voids    Fall  - Likely secondary to hypotension  - CT of the head negative for acute abnormalities -await PT eval  Diarrhea - C. difficile PCR negative -Start probiotics and Imodium prn  HTN -controlled but BP slowly creeping up, resume verapamil-slowly resume Benicar when able  Seizure  - Continue Keppra   . GERD (gastroesophageal reflux disease)  - Continue with PPI   Hypercholesteremia -resume statins  Chronic pain syndrome - When necessary oxycodone  -resume Lyrica, Cymbalta and Elavil  Disposition: Remain inpatient  DVT  Prophylaxis: Prophylactic  Heparin   Code Status: Full code   Family Communication Son at bedside yesterday  Procedures:  None  CONSULTS:  pulmonary/intensive care   MEDICATIONS: Scheduled Meds: . cefTRIAXone (ROCEPHIN)  IV  1 g Intravenous Q24H  . heparin  5,000 Units Subcutaneous Q8H  . hydrocortisone sod succinate (SOLU-CORTEF) inj  100 mg Intravenous Q8H  . levETIRAcetam  500 mg Oral BID  . oxybutynin  10 mg Oral BID  . pantoprazole  40 mg Oral BID  . sodium chloride  3 mL Intravenous Q12H   Continuous Infusions: . sodium chloride 1,000 mL (02/21/13 2058)   PRN Meds:.acetaminophen, acetaminophen, albuterol, ondansetron (ZOFRAN) IV, ondansetron  Antibiotics: Anti-infectives   Start     Dose/Rate Route Frequency Ordered Stop   02/21/13 1000  cefTRIAXone (ROCEPHIN) 1 g in dextrose 5 % 50 mL IVPB     1 g 100 mL/hr over 30 Minutes Intravenous Every 24 hours 02/21/13 0914     02/21/13 0500  cefTRIAXone (ROCEPHIN) 1 g in dextrose 5 % 50 mL IVPB     1 g 100 mL/hr over 30 Minutes Intravenous  Once 02/21/13 0446 02/21/13 0607       PHYSICAL EXAM: Vital signs in last 24 hours: Filed Vitals:   02/22/13 0500 02/22/13 0600 02/22/13 0700 02/22/13 0725  BP: 123/59 122/68 105/78   Pulse: 89 95 84   Temp:    98.6 F (37 C)  TempSrc:    Oral  Resp: 14 15 21    Weight: 85.1 kg (187 lb 9.8 oz)     SpO2: 95% 95% 97%     Weight change: 1.2 kg (2 lb 10.3 oz) Filed Weights   02/21/13 0017 02/22/13 0500  Weight: 83.9 kg (184 lb 15.5 oz) 85.1 kg (187  lb 9.8 oz)   Body mass index is 36.64 kg/(m^2).   Gen Exam: Awake and alert with clear speech.   Neck: Supple, No JVD.   Chest: B/L Clear.   CVS: S1 S2 Regular, no murmurs.  Abdomen: soft, BS +, non tender, non distended.  Extremities: no edema, lower extremities warm to touch. Neurologic: Chronic left hemiplegia-upper ext fingers with flexion contractures   Skin: No Rash.   Wounds: N/A.   Intake/Output from previous  day:  Intake/Output Summary (Last 24 hours) at 02/22/13 0838 Last data filed at 02/22/13 0729  Gross per 24 hour  Intake   7603 ml  Output   5825 ml  Net   1778 ml     LAB RESULTS: CBC  Recent Labs Lab 02/21/13 0139 02/22/13 0430  WBC 11.8* 8.2  HGB 9.2* 8.3*  HCT 29.0* 26.1*  PLT 317 280  MCV 78.6 79.8  MCH 24.9* 25.4*  MCHC 31.7 31.8  RDW 15.4 15.5  LYMPHSABS 2.4  --   MONOABS 0.9  --   EOSABS 0.3  --   BASOSABS 0.0  --     Chemistries   Recent Labs Lab 02/21/13 0139 02/22/13 0430  NA 133* 142  K 4.8 4.1  CL 103 114*  CO2 23 22  GLUCOSE 114* 113*  BUN 35* 14  CREATININE 2.08* 0.82  CALCIUM 8.9 8.1*    CBG:  Recent Labs Lab 02/21/13 0231  GLUCAP 93    GFR The CrCl is unknown because both a height and weight (above a minimum accepted value) are required for this calculation.  Coagulation profile No results found for this basename: INR, PROTIME,  in the last 168 hours  Cardiac Enzymes No results found for this basename: CK, CKMB, TROPONINI, MYOGLOBIN,  in the last 168 hours  No components found with this basename: POCBNP,  No results found for this basename: DDIMER,  in the last 72 hours No results found for this basename: HGBA1C,  in the last 72 hours No results found for this basename: CHOL, HDL, LDLCALC, TRIG, CHOLHDL, LDLDIRECT,  in the last 72 hours No results found for this basename: TSH, T4TOTAL, FREET3, T3FREE, THYROIDAB,  in the last 72 hours No results found for this basename: VITAMINB12, FOLATE, FERRITIN, TIBC, IRON, RETICCTPCT,  in the last 72 hours No results found for this basename: LIPASE, AMYLASE,  in the last 72 hours  Urine Studies No results found for this basename: UACOL, UAPR, USPG, UPH, UTP, UGL, UKET, UBIL, UHGB, UNIT, UROB, ULEU, UEPI, UWBC, URBC, UBAC, CAST, CRYS, UCOM, BILUA,  in the last 72 hours  MICROBIOLOGY: Recent Results (from the past 240 hour(s))  MRSA PCR SCREENING     Status: None   Collection Time     02/21/13 10:37 AM      Result Value Range Status   MRSA by PCR NEGATIVE  NEGATIVE Final   Comment:            The GeneXpert MRSA Assay (FDA     approved for NASAL specimens     only), is one component of a     comprehensive MRSA colonization     surveillance program. It is not     intended to diagnose MRSA     infection nor to guide or     monitor treatment for     MRSA infections.    RADIOLOGY STUDIES/RESULTS: Dg Chest 2 View  02/21/2013   *RADIOLOGY REPORT*  Clinical Data: Altered mental status.  Fall.  CHEST - 2 VIEW  Comparison: 01/28/2012  Findings: The heart, mediastinum and hila are unremarkable.  The lungs are clear.  No pleural effusion or pneumothorax.  The bony thorax is demineralized but intact.  IMPRESSION: No acute cardiopulmonary disease.   Original Report Authenticated By: Amie Portland, M.D.   Ct Head Wo Contrast  02/21/2013   *RADIOLOGY REPORT*  Clinical Data: Fall, left side paralysis from prior stroke, history hypertension, seizure  CT HEAD WITHOUT CONTRAST  Technique:  Contiguous axial images were obtained from the base of the skull through the vertex without contrast.  Comparison: 01/28/2012  Findings: Generalized atrophy. Ex vacuo dilatation of the right lateral ventricle again identified secondary to large area of right MCA infarction involving the right parietal lobe and basal ganglia. No midline shift or mass effect. Small vessel chronic ischemic changes of deep cerebral white matter. No intracranial hemorrhage or evidence of acute infarction. No extra-axial fluid collections. Postsurgical changes of right frontoparietal craniotomy. Hyperdense nodule para falcine at the posterior right parietal region 11 x 9 mm consistent with meningioma, unchanged. Dense calcification within anterior falx. No new intracranial mass lesions.  IMPRESSION: Large old right MCA infarct. Mild small vessel chronic ischemic changes deep cerebral white matter. Stable right parafalcine meningioma  posteriorly. Postsurgical changes right frontoparietal craniotomy. No new intracranial abnormalities.   Original Report Authenticated By: Ulyses Southward, M.D.   US Renal  02/21/2013   *RADIOLOGY REPORT*  Clinical Data: Acute renal failure  RENAL/URINARY TRACT ULTRASOUND COMPLETE  Comparison:  CT, 11/03/2010  Findings:  Right Kidney:  Right kidney is normal in overall size and echogenicity.  There is mild renal cortical thinning.  No renal masses or stones.  No hydronephrosis.  The right kidney measures 9.9 cm.  Left Kidney:  Left kidney is normal in overall size echogenicity. No left renal masses or stones or hydronephrosis.  Left kidney measures 10.2 cm.  Bladder:  Unremarkable  IMPRESSION: Mild right renal cortical thinning.  Exam is otherwise unremarkable.  No hydronephrosis.   Original Report Authenticated By: Amie Portland, M.D.    Jeoffrey Massed, MD  [facility] Pager:[phone]  If 7PM-7AM, please contact night-coverage [email or web address] Password [facility] 02/22/2013, 8:38 AM   LOS: 1 day

## 2013-02-22 NOTE — Progress Notes (Signed)
  Echocardiogram 2D Echocardiogram has been performed.  [place]ian Co 02/22/2013, 10:27 AM

## 2013-02-22 NOTE — Evaluation (Signed)
Physical Therapy Evaluation Patient Details Name: Jordan Rivas MRN: [id number] DOB: 1961-04-14 Today's Date: 02/22/2013 Time: 0820-0905 PT Time Calculation (min): 45 min  PT Assessment / Plan / Recommendation Clinical Impression  52 yo female with a 19 year history of hemiparesis admitted with UTI; Presents with decr functional independence; Will benefit from PT to maximize functional independence and facilitate dc planning; Of note, pt very much would like to dc home independently, and as such, PT goals are set at modified independence; She will need to progress well to go home    PT Assessment  Patient needs continued PT services    Follow Up Recommendations  Home health PT;Supervision - Intermittent (if slow progress, must consider SNF)    Does the patient have the potential to tolerate intense rehabilitation      Barriers to Discharge Decreased caregiver support Must be modified independent to dc home    Equipment Recommendations  Other (comment) (consider 3in1, tub bench)    Recommendations for Other Services OT consult   Frequency Min 3X/week    Precautions / Restrictions Precautions Precautions: Fall   Pertinent Vitals/Pain Painful L side, particularly painful shoulder; Did not rate patient repositioned for comfort       Mobility  Bed Mobility Bed Mobility: Rolling Right;Rolling Left;Right Sidelying to Sit;Sitting - Scoot to Edge of Bed;Sit to Supine Rolling Right: 4: Min guard;With rail Rolling Left: 5: Supervision;With rail Right Sidelying to Sit: 4: Min assist;With rails Sitting - Scoot to Edge of Bed: 3: Mod assist (with bed pad) Sit to Supine: 3: Mod assist Details for Bed Mobility Assistance: Heavily dependent on stronger R side to assist L side, often bringing LUE over with RUE and boosting LLE up and over with RLE; slow to move, and required mod assist with bed pad to scoot L hip to EOB to square off at EOB; physical assist for gettingLEs into  bed Transfers Transfers: Sit to Stand;Stand to Sit Sit to Stand: 4: Min assist;With upper extremity assist;From bed Stand to Sit: 4: Min assist;With upper extremity assist;To bed Stand Pivot Transfers: Other (comment) (Max encouragement to get OOB, but ultimately, pt refused) Details for Transfer Assistance: Min assist to initiate transfer by translating weight forward over feet; Heavy R lean, and dependence on stronger R side for successful sit to stand; steadied herself with R UE on bedrail Ambulation/Gait Ambulation/Gait Assistance: Not tested (comment) (pt declined) Ambulation/Gait Assistance Details: Plan to work on walking next session with AFO and straight cane    Exercises     PT Diagnosis: Difficulty walking;Abnormality of gait  PT Problem List: Decreased strength;Decreased activity tolerance;Decreased balance;Decreased mobility;Decreased knowledge of use of DME;Pain PT Treatment Interventions: DME instruction;Gait training;Stair training;Functional mobility training;Therapeutic activities;Therapeutic exercise;Balance training;Patient/family education;Neuromuscular re-education;Cognitive remediation   PT Goals Acute Rehab PT Goals PT Goal Formulation: With patient Time For Goal Achievement: 03/08/13 Potential to Achieve Goals: Good Pt will go Supine/Side to Sit: with modified independence PT Goal: Supine/Side to Sit - Progress: Goal set today Pt will go Sit to Supine/Side: with modified independence PT Goal: Sit to Supine/Side - Progress: Goal set today Pt will go Sit to Stand: with modified independence PT Goal: Sit to Stand - Progress: Goal set today Pt will go Stand to Sit: with modified independence PT Goal: Stand to Sit - Progress: Goal set today Pt will Transfer Bed to Chair/Chair to Bed: with modified independence PT Transfer Goal: Bed to Chair/Chair to Bed - Progress: Goal set today Pt will Ambulate: 51 - 150  feet;with modified independence;with cane PT Goal:  Ambulate - Progress: Goal set today Pt will Go Up / Down Stairs: 3-5 stairs;with rail(s);with modified independence PT Goal: Up/Down Stairs - Progress: Goal set today  Visit Information  Last PT Received On: 02/22/13 Assistance Needed: +1 (try+2 for progressive amb)    Subjective Data  Subjective: Not wanting OOB; Reports is quite independent Patient Stated Goal: get better and go home   Prior Functioning  Home Living Lives With: Alone Available Help at Discharge: Family;Available PRN/intermittently Type of Home: House Home Access: Stairs to enter [employer] of Steps: 3 (and one step down on sidewalk before getting to steps) Entrance Stairs-Rails: Right;Left Home Layout: One level Bathroom Shower/Tub: Tub/shower unit Home Adaptive Equipment: Straight cane;Tub transfer bench (reports her tub transfer bench is falling apart) Prior Function Level of Independence: Independent with assistive device(s) Able to Take Stairs?: Yes Driving: No Comments: Reports walks with a straight cane and her AFO Communication Communication: No difficulties Dominant Hand: Right    Cognition  Cognition Arousal/Alertness: Awake/alert Behavior During Therapy:  (willingness to participate seemed to fluctuate intra-session) Overall Cognitive Status: No family/caregiver present to determine baseline cognitive functioning    Extremity/Trunk Assessment Right Upper Extremity Assessment RUE ROM/Strength/Tone: [facility] for tasks assessed Left Upper Extremity Assessment LUE ROM/Strength/Tone: Deficits LUE ROM/Strength/Tone Deficits: Dense hemi Right Lower Extremity Assessment RLE ROM/Strength/Tone: WFL for tasks assessed Left Lower Extremity Assessment LLE ROM/Strength/Tone: Deficits LLE ROM/Strength/Tone Deficits: Dense hemiplegia; 0/5 ankle dorsiflexion, 1/5 knee extension, 0/5 hip flexion   Balance Balance Balance Assessed: Yes Static Standing Balance Static Standing - Balance Support: Right  upper extremity supported Static Standing - Level of Assistance: 5: Stand by assistance Static Standing - Comment/# of Minutes: Stood at [employer] and this therapist assisted with hygeine; Heavy R Lean, fatigued quickly, after approx 1 minute  End of Session PT - End of Session Activity Tolerance: Patient limited by fatigue Patient left: in bed;with call bell/phone within reach;with nursing in room Nurse Communication: Mobility status  GP     Van Clines [facility] [place], [place] [phone]  02/22/2013, 12:18 PM

## 2013-02-22 NOTE — Progress Notes (Signed)
Pt transferred to unit 6700 from 3300. Pt is alert and oriented to staff, call bell, and room. Bed in lowest position. Call bell within reach. Full assessment to Epic. Will continue to monitor. Fayne Norrie, RN

## 2013-02-22 NOTE — Progress Notes (Signed)
PULMONARY  / CRITICAL CARE MEDICINE  Name: Jordan Rivas MRN: [id number] DOB: 1961-03-15    ADMISSION DATE:  02/21/2013 CONSULTATION DATE:  02/21/13  REFERRING MD :  Jerral Ralph PRIMARY SERVICE:  [facility]  CHIEF COMPLAINT:  Hypotension   BRIEF PATIENT DESCRIPTION: 52 yo female with hx HTN, CVA with L hemiplegia, seizures admitted 6/21 by [facility] with UTI and AMS.  Pt with persistent hypotension despite fluids and PCCM consulted.    SIGNIFICANT EVENTS / STUDIES:  Renal u/s 6/21>>>Mild right renal cortical thinning. Exam is otherwise unremarkable. No hydronephrosis. CT head 6/21>>>Large old right MCA infarct. Mild small vessel chronic ischemic changes deep cerebral white matter. Stable right parafalcine meningioma posteriorly. Postsurgical changes right frontoparietal craniotomy. No new intracranial abnormalities.  LINES / TUBES: none  CULTURES: Urine 6/21>>> BCx2 6/21>>>  ANTIBIOTICS: Rocephin 6/21>>>  SUBJECTIVE: Feels much better.  Denies headache, dizziness, chest pain, nausea, or dyspnea.  VITAL SIGNS: Temp:  [97.5 F (36.4 C)-98.3 F (36.8 C)] 98.2 F (36.8 C) (06/22 0000) Pulse Rate:  [48-109] 89 (06/22 0500) Resp:  [10-32] 14 (06/22 0500) BP: (71-124)/(30-77) 123/59 mmHg (06/22 0500) SpO2:  [88 %-100 %] 95 % (06/22 0500) Weight:  [187 lb 9.8 oz (85.1 kg)] 187 lb 9.8 oz (85.1 kg) (06/22 0500)  PHYSICAL EXAMINATION: General:  No distress, sitting up in bed Neuro: Alert, follows commands HEENT:  No oral lesions Cardiovascular:  regular Lungs:  resps even non labored on [place] Abdomen:  Soft, non tender Musculoskeletal:  L arm contracted, no BLE edema   Recent Labs Lab 02/21/13 0139 02/22/13 0430  NA 133* 142  K 4.8 4.1  CL 103 114*  CO2 23 22  BUN 35* 14  CREATININE 2.08* 0.82  GLUCOSE 114* 113*    Recent Labs Lab 02/21/13 0139 02/22/13 0430  HGB 9.2* 8.3*  HCT 29.0* 26.1*  WBC 11.8* 8.2  PLT 317 280   Dg Chest 2 View  02/21/2013   *RADIOLOGY REPORT*   Clinical Data: Altered mental status.  Fall.  CHEST - 2 VIEW  Comparison: 01/28/2012  Findings: The heart, mediastinum and hila are unremarkable.  The lungs are clear.  No pleural effusion or pneumothorax.  The bony thorax is demineralized but intact.  IMPRESSION: No acute cardiopulmonary disease.   Original Report Authenticated By: Amie Portland, M.D.   Ct Head Wo Contrast  02/21/2013   *RADIOLOGY REPORT*  Clinical Data: Fall, left side paralysis from prior stroke, history hypertension, seizure  CT HEAD WITHOUT CONTRAST  Technique:  Contiguous axial images were obtained from the base of the skull through the vertex without contrast.  Comparison: 01/28/2012  Findings: Generalized atrophy. Ex vacuo dilatation of the right lateral ventricle again identified secondary to large area of right MCA infarction involving the right parietal lobe and basal ganglia. No midline shift or mass effect. Small vessel chronic ischemic changes of deep cerebral white matter. No intracranial hemorrhage or evidence of acute infarction. No extra-axial fluid collections. Postsurgical changes of right frontoparietal craniotomy. Hyperdense nodule para falcine at the posterior right parietal region 11 x 9 mm consistent with meningioma, unchanged. Dense calcification within anterior falx. No new intracranial mass lesions.  IMPRESSION: Large old right MCA infarct. Mild small vessel chronic ischemic changes deep cerebral white matter. Stable right parafalcine meningioma posteriorly. Postsurgical changes right frontoparietal craniotomy. No new intracranial abnormalities.   Original Report Authenticated By: Ulyses Southward, M.D.   US Renal  02/21/2013   *RADIOLOGY REPORT*  Clinical Data: Acute renal failure  RENAL/URINARY TRACT  ULTRASOUND COMPLETE  Comparison:  CT, 11/03/2010  Findings:  Right Kidney:  Right kidney is normal in overall size and echogenicity.  There is mild renal cortical thinning.  No renal masses or stones.  No hydronephrosis.   The right kidney measures 9.9 cm.  Left Kidney:  Left kidney is normal in overall size echogenicity. No left renal masses or stones or hydronephrosis.  Left kidney measures 10.2 cm.  Bladder:  Unremarkable  IMPRESSION: Mild right renal cortical thinning.  Exam is otherwise unremarkable.  No hydronephrosis.   Original Report Authenticated By: Amie Portland, M.D.    ASSESSMENT / PLAN:  UTI  Sepsis/ Hypotension - r/t UTI and adrenal insufficiency >> improved. AMS - r/t sepsis/hypotension.  Much improved. CT head neg. >> improved. Acute renal failure - prerenal in setting sepsis, dehydration >> resolved. Adrenal insufficiency  PLAN -  Fluids, Abx and cortisol replacement per [facility].  PCCM will sign off.    Coralyn Helling, MD [facility] Pulmonary/Critical Care 02/22/2013, 7:15 AM Pager:  [phone] After 3pm call: [phone]

## 2013-02-23 ENCOUNTER — Inpatient Hospital Stay (HOSPITAL_COMMUNITY): Payer: Medicare Other

## 2013-02-23 DIAGNOSIS — W19XXXA Unspecified fall, initial encounter: Secondary | ICD-10-CM

## 2013-02-23 LAB — BASIC METABOLIC PANEL
BUN: 20 mg/dL (ref 6–23)
Calcium: 8.5 mg/dL (ref 8.4–10.5)
Chloride: 111 mEq/L (ref 96–112)
Creatinine, Ser: 1.02 mg/dL (ref 0.50–1.10)
GFR calc Af Amer: 71 mL/min — ABNORMAL LOW (ref >90)
GFR calc non Af Amer: 61 mL/min — ABNORMAL LOW (ref >90)

## 2013-02-23 LAB — CLOSTRIDIUM DIFFICILE BY PCR: Toxigenic C. Difficile by PCR: NEGATIVE

## 2013-02-23 LAB — URINE CULTURE

## 2013-02-23 LAB — PRO B NATRIURETIC PEPTIDE: Pro B Natriuretic peptide (BNP): 15214 pg/mL — ABNORMAL HIGH (ref 0–125)

## 2013-02-23 MED ORDER — SODIUM CHLORIDE 0.45 % IV SOLN
INTRAVENOUS | Status: DC
  Administered 2013-02-23: 12:00:00 via INTRAVENOUS

## 2013-02-23 MED ORDER — LOPERAMIDE HCL 2 MG PO CAPS
2.0000 mg | ORAL_CAPSULE | ORAL | Status: DC | PRN
  Administered 2013-02-23 – 2013-03-02 (×8): 2 mg via ORAL
  Filled 2013-02-23 (×9): qty 1

## 2013-02-23 MED ORDER — SACCHAROMYCES BOULARDII 250 MG PO CAPS
250.0000 mg | ORAL_CAPSULE | Freq: Two times a day (BID) | ORAL | Status: DC
  Administered 2013-02-23 – 2013-03-03 (×17): 250 mg via ORAL
  Filled 2013-02-23 (×18): qty 1

## 2013-02-23 MED ORDER — OXYCODONE HCL 5 MG PO TABS
5.0000 mg | ORAL_TABLET | Freq: Four times a day (QID) | ORAL | Status: DC | PRN
  Administered 2013-02-23 – 2013-03-02 (×14): 5 mg via ORAL
  Filled 2013-02-23 (×16): qty 1

## 2013-02-23 MED ORDER — HYDROCORTISONE SOD SUCCINATE 100 MG IJ SOLR
50.0000 mg | Freq: Three times a day (TID) | INTRAMUSCULAR | Status: DC
  Administered 2013-02-23 – 2013-02-24 (×3): 50 mg via INTRAVENOUS
  Filled 2013-02-23 (×6): qty 1

## 2013-02-23 MED ORDER — PIPERACILLIN-TAZOBACTAM 3.375 G IVPB
3.3750 g | Freq: Three times a day (TID) | INTRAVENOUS | Status: DC
  Administered 2013-02-23 – 2013-02-26 (×8): 3.375 g via INTRAVENOUS
  Filled 2013-02-23 (×10): qty 50

## 2013-02-23 MED ORDER — POTASSIUM CHLORIDE CRYS ER 20 MEQ PO TBCR
40.0000 meq | EXTENDED_RELEASE_TABLET | Freq: Once | ORAL | Status: AC
  Administered 2013-02-23: 40 meq via ORAL
  Filled 2013-02-23: qty 2

## 2013-02-23 MED ORDER — FUROSEMIDE 10 MG/ML IJ SOLN
40.0000 mg | Freq: Once | INTRAMUSCULAR | Status: AC
Start: 2013-02-23 — End: 2013-02-23
  Administered 2013-02-23: 40 mg via INTRAVENOUS
  Filled 2013-02-23: qty 4

## 2013-02-23 MED ORDER — ALBUTEROL SULFATE (5 MG/ML) 0.5% IN NEBU
2.5000 mg | INHALATION_SOLUTION | Freq: Three times a day (TID) | RESPIRATORY_TRACT | Status: DC
  Administered 2013-02-23: 2.5 mg via RESPIRATORY_TRACT
  Filled 2013-02-23 (×2): qty 0.5

## 2013-02-23 NOTE — Progress Notes (Signed)
Patient's Foley Cather was removed, per order, at 11:20am.  Patient has had minimal urine output since removal of catheter. Difficult to discern exact output as patient is incontinent of stool and unable to state if she urinated as well. Patient has consumed minimal fluids po this afternoon and does not have continuous IV running at this time. Night shift nurse made aware. Will perform Bladder Scan.

## 2013-02-23 NOTE — Progress Notes (Signed)
ANTIBIOTIC CONSULT NOTE - INITIAL  Pharmacy Consult for zosyn Indication: aspiration pneumonia  Allergies  Allergen Reactions  . Celebrex (Celecoxib) Swelling    "lips; it was terrible"    Patient Measurements: Weight: 187 lb 9.8 oz (85.1 kg) Adjusted Body Weight:   Vital Signs: Temp: 98 F (36.7 C) (06/23 0848) Temp src: Oral (06/23 0453) BP: 149/82 mmHg (06/23 0848) Pulse Rate: 87 (06/23 0848) Intake/Output from previous day: 06/22 0701 - 06/23 0700 In: 820 [P.O.:720; I.V.:100] Out: 975 [Urine:975] Intake/Output from this shift: Total I/O In: 660 [P.O.:360; I.V.:250; IV Piggyback:50] Out: 125 [Urine:125]  Labs:  Recent Labs  02/21/13 0139 02/22/13 0430 02/23/13 1359  WBC 11.8* 8.2  --   HGB 9.2* 8.3*  --   PLT 317 280  --   CREATININE 2.08* 0.82 1.02   The CrCl is unknown because both a height and weight (above a minimum accepted value) are required for this calculation. No results found for this basename: VANCOTROUGH, Leodis Binet, VANCORANDOM, GENTTROUGH, GENTPEAK, GENTRANDOM, TOBRATROUGH, TOBRAPEAK, TOBRARND, AMIKACINPEAK, AMIKACINTROU, AMIKACIN,  in the last 72 hours   Microbiology: Recent Results (from the past 720 hour(s))  URINE CULTURE     Status: None   Collection Time    02/21/13  3:47 AM      Result Value Range Status   Specimen Description URINE, CATHETERIZED   Final   Special Requests CX ADDED AT 0408 ON [id number]   Final   Culture  Setup Time 02/21/2013 15:24   Final   Colony Count >=100,000 COLONIES/ML   Final   Culture ESCHERICHIA COLI   Final   Report Status 02/23/2013 FINAL   Final   Organism ID, Bacteria ESCHERICHIA COLI   Final  CULTURE, BLOOD (ROUTINE X 2)     Status: None   Collection Time    02/21/13  6:53 AM      Result Value Range Status   Specimen Description BLOOD RIGHT HAND   Final   Special Requests BOTTLES DRAWN AEROBIC AND ANAEROBIC 5CC   Final   Culture  Setup Time 02/21/2013 15:12   Final   Culture     Final   Value:         BLOOD CULTURE RECEIVED NO GROWTH TO DATE CULTURE WILL BE HELD FOR 5 DAYS BEFORE ISSUING A FINAL NEGATIVE REPORT   Report Status PENDING   Incomplete  CULTURE, BLOOD (ROUTINE X 2)     Status: None   Collection Time    02/21/13 10:10 AM      Result Value Range Status   Specimen Description BLOOD LEFT HAND   Final   Special Requests BOTTLES DRAWN AEROBIC ONLY 5CC   Final   Culture  Setup Time 02/21/2013 15:10   Final   Culture     Final   Value:        BLOOD CULTURE RECEIVED NO GROWTH TO DATE CULTURE WILL BE HELD FOR 5 DAYS BEFORE ISSUING A FINAL NEGATIVE REPORT   Report Status PENDING   Incomplete  MRSA PCR SCREENING     Status: None   Collection Time    02/21/13 10:37 AM      Result Value Range Status   MRSA by PCR NEGATIVE  NEGATIVE Final   Comment:            The GeneXpert MRSA Assay (FDA     approved for NASAL specimens     only), is one component of a     comprehensive MRSA colonization  surveillance program. It is not     intended to diagnose MRSA     infection nor to guide or     monitor treatment for     MRSA infections.  CLOSTRIDIUM DIFFICILE BY PCR     Status: None   Collection Time    02/23/13  8:48 AM      Result Value Range Status   C difficile by pcr NEGATIVE  NEGATIVE Final    Medical History: Past Medical History  Diagnosis Date  . Hypertension   . Hypercholesteremia   . Insomnia   . Neuropathy   . GERD (gastroesophageal reflux disease)   . Overactive bladder   . Peripheral vascular disease     left arm and leg; since stroke 1995  . Seizures 01/27/12    "first one ever"  . CVA (cerebrovascular accident) 1995    "left side paralyzed"  . Chronic pain     "in this left arm and leg that don't move"    Medications:  Scheduled:  . albuterol  2.5 mg Nebulization TID  . atorvastatin  20 mg Oral QPM  . DULoxetine  60 mg Oral Daily  . feeding supplement  1 Container Oral TID BM  . furosemide  40 mg Intravenous Once  . heparin  5,000 Units  Subcutaneous Q8H  . hydrocortisone sod succinate (SOLU-CORTEF) inj  50 mg Intravenous Q8H  . levETIRAcetam  500 mg Oral BID  . oxybutynin  10 mg Oral BID  . pantoprazole  40 mg Oral BID  . potassium chloride  40 mEq Oral Once  . pregabalin  75 mg Oral BID  . saccharomyces boulardii  250 mg Oral BID  . sodium chloride  3 mL Intravenous Q12H   Infusions:   Assessment: 52 yo female with aspiration pneumonia will be put on zosyn therapy. SCr 0.82 on 02/22/13.  Goal of Therapy:    Plan:  1) Zosyn 3.375g iv q8h (4hr infusion)  Jordan Rivas, Tsz-Yin 02/23/2013,4:37 PM

## 2013-02-23 NOTE — Care Management Note (Signed)
   CARE MANAGEMENT NOTE 02/23/2013  Patient:  Jordan Rivas, Jordan Rivas   Account Number:  [id number]  Date Initiated:  02/23/2013  Documentation initiated by:  Johny Shock  Subjective/Objective Assessment:   referral for HHPT     Action/Plan:   6/23 Met with pt who currently does not think that she needs HHPT, however will revisit  closer to time of d/c. Pt has Personal Care Services with [facility]. 3 hr per day / 5 days per week.   Anticipated DC Date:     Anticipated DC Plan:  HOME W HOME HEALTH SERVICES         Choice offered to / List presented to:          [facility] arranged  HH-8 PCS/PERSONAL CARE SERVICES      HH agency  [facility]   Status of service:  In process, will continue to follow Medicare Important Message given?   (If response is "NO", the following Medicare IM given date fields will be blank) Date Medicare IM given:   Date Additional Medicare IM given:    Discharge Disposition:    Per UR Regulation:    If discussed at Long Length of Stay Meetings, dates discussed:    Comments:  6.23.2014 Pt currently does not think that she needs HHPT, will follow for changes. Pt has PCS with [facility], 3hr/day, 5d/wk. Johny Shock RN MPH Case Manager [phone]

## 2013-02-23 NOTE — Progress Notes (Signed)
Physical Therapy Treatment Patient Details Name: Jordan Rivas MRN: [id number] DOB: 1961-05-03 Today's Date: 02/23/2013 Time: 7846-9629 PT Time Calculation (min): 29 min  PT Assessment / Plan / Recommendation Comments on Treatment Session  Pt appears to be improving and she says she is approaching baseline for her, but she continues to need some assist with application of brace and shoe and overall safety.  She would benefit from HHPT if she will agree.    Follow Up Recommendations  Home health PT;Supervision - Intermittent (if slow progress, must consider SNF)     Does the patient have the potential to tolerate intense rehabilitation     Barriers to Discharge        Equipment Recommendations  Other (comment) (consider 3in1, tub bench)    Recommendations for Other Services OT consult  Frequency Min 3X/week   Plan Discharge plan remains appropriate;Frequency remains appropriate    Precautions / Restrictions Precautions Precautions: Fall   Pertinent Vitals/Pain Pt with no c/o pain    Mobility  Bed Mobility Bed Mobility: Rolling Right;Rolling Left;Right Sidelying to Sit;Sitting - Scoot to Edge of Bed;Sit to Supine Rolling Right: 5: Supervision;With rail Rolling Left: 5: Supervision;With rail Right Sidelying to Sit: With rails;5: Supervision Sitting - Scoot to Edge of Bed: 5: Supervision Sit to Supine: 3: Mod assist Details for Bed Mobility Assistance: Pt has some difficulty maneuvering on hospital mattress Transfers Transfers: Sit to Stand;Stand to Sit Sit to Stand: With upper extremity assist;From bed;5: Supervision Stand to Sit: To bed;5: Supervision;With upper extremity assist Stand Pivot Transfers: Other (comment) Details for Transfer Assistance: pt needs extra time for all mobility.  Needed assist to put AFO and shoe on foot thougth pt insisted she was able to do it Ambulation/Gait Ambulation/Gait Assistance: 5: Supervision (pt declined) Ambulation Distance (Feet): 40  Feet Assistive device: Straight cane Ambulation/Gait Assistance Details: pt does not want any hands on assist or close supervision, though she appears to be unsteady Gait Pattern: Step-to pattern;Step-through pattern;Decreased step length - left;Decreased stance time - left;Decreased stride length;Decreased hip/knee flexion - left;Decreased dorsiflexion - left;Decreased weight shift to left Gait velocity: decreased General Gait Details: Pt has left  hemiplegic gait pattern with use of AFO, shoe and straight cane. Pt has difficulty advancing left leg, but she reports she is close to baseline and will be able to take care of herself at home    Exercises     PT Diagnosis:    PT Problem List:   PT Treatment Interventions:     PT Goals Acute Rehab PT Goals PT Goal Formulation: With patient Time For Goal Achievement: 03/08/13 Potential to Achieve Goals: Good Pt will go Supine/Side to Sit: with modified independence PT Goal: Supine/Side to Sit - Progress: Progressing toward goal Pt will go Sit to Supine/Side: with modified independence Pt will go Sit to Stand: with modified independence PT Goal: Sit to Stand - Progress: Progressing toward goal Pt will go Stand to Sit: with modified independence PT Goal: Stand to Sit - Progress: Progressing toward goal Pt will Transfer Bed to Chair/Chair to Bed: with modified independence PT Transfer Goal: Bed to Chair/Chair to Bed - Progress: Progressing toward goal Pt will Ambulate: 51 - 150 feet;with modified independence;with cane PT Goal: Ambulate - Progress: Progressing toward goal Pt will Go Up / Down Stairs: 3-5 stairs;with rail(s);with modified independence  Visit Information  Last PT Received On: 02/23/13 Assistance Needed: +1    Subjective Data  Subjective: "I can do it" Patient Stated Goal:  Pt states she will have her stepson at home to help her   Cognition  Cognition Arousal/Alertness: Awake/alert Behavior During Therapy: WFL for tasks  assessed/performed    Balance  Balance Balance Assessed: Yes Static Standing Balance Static Standing - Balance Support: Right upper extremity supported Static Standing - Level of Assistance: 6: Modified independent (Device/Increase time)  End of Session PT - End of Session Equipment Utilized During Treatment: Left ankle foot orthosis (with shoe) Activity Tolerance: Patient limited by fatigue Patient left: with call bell/phone within reach;in chair Nurse Communication: Mobility status   GP     Donnetta Hail 02/23/2013, 3:41 PM

## 2013-02-23 NOTE — Progress Notes (Signed)
UR COMPLETED  

## 2013-02-23 NOTE — Progress Notes (Signed)
PATIENT DETAILS Name: Jordan Rivas Age: 52 y.o. Sex: female Date of Birth: 06-01-1961 Admit Date: 02/21/2013 Admitting Physician Ron Parker, MD ZOX:[id number], MD  Subjective: Feels better  Assessment/Plan: Principal Problem:   Sepsis associated hypotension -patient was admitted to the SDU and given fluid boluses, however was still hypotensive, cortisol level was checked-it was 9-given stress dose hydrocortisone with good response. -much improved after starting hydrocortisone-BP now back to normal- will decrease hydrocortisone to 50 mg IV 3 times a day, will then we'll transition to oral prednisone and slowly taper it off -c/w Rocephin - Urine culture shows pansensitive Escherichia coli-continue with Rocephin for - Blood cultures 6/21-negative so far  Active Problems: UTI (lower urinary tract infection)/right-sided pyelonephritis -causing above -c/w Rocephin   Relative Adrenal Insufficiency -cortisol inappropriately low normal in the setting of hypotension -taper off Hydrocortisone slowly  ARF (acute renal failure)  - Likely prerenal -resolved -renal ultrasound-neg for hydronephrosis  Altered mental status  -resolved - Likely secondary to toxic metabolite encephalopathy from narcotics and renal failure. CT of the head is negative for acute abnormalities.   Acute urinary retention  - Remove Foley and see if patient voids    Fall  - Likely secondary to hypotension  - CT of the head negative for acute abnormalities -await PT eval  Diarrhea - C. difficile PCR negative -Start probiotics and Imodium prn  HTN -controlled but BP slowly creeping up, resume verapamil-slowly resume Benicar when able  Seizure  - Continue Keppra   . GERD (gastroesophageal reflux disease)  - Continue with PPI   Hypercholesteremia -resume statins  Chronic pain syndrome - When necessary oxycodone  -resume Lyrica, Cymbalta and Elavil  Disposition: Remain inpatient  DVT  Prophylaxis: Prophylactic  Heparin   Code Status: Full code   Family Communication Son at bedside yesterday  Procedures:  None  CONSULTS:  pulmonary/intensive care   MEDICATIONS: Scheduled Meds: . atorvastatin  20 mg Oral QPM  . cefTRIAXone (ROCEPHIN)  IV  1 g Intravenous Q24H  . DULoxetine  60 mg Oral Daily  . feeding supplement  1 Container Oral TID BM  . heparin  5,000 Units Subcutaneous Q8H  . hydrocortisone sod succinate (SOLU-CORTEF) inj  50 mg Intravenous Q8H  . levETIRAcetam  500 mg Oral BID  . oxybutynin  10 mg Oral BID  . pantoprazole  40 mg Oral BID  . pregabalin  75 mg Oral BID  . saccharomyces boulardii  250 mg Oral BID  . sodium chloride  3 mL Intravenous Q12H   Continuous Infusions: . sodium chloride     PRN Meds:.acetaminophen, acetaminophen, albuterol, loperamide, ondansetron (ZOFRAN) IV, ondansetron, oxyCODONE  Antibiotics: Anti-infectives   Start     Dose/Rate Route Frequency Ordered Stop   02/21/13 1000  cefTRIAXone (ROCEPHIN) 1 g in dextrose 5 % 50 mL IVPB     1 g 100 mL/hr over 30 Minutes Intravenous Every 24 hours 02/21/13 0914     02/21/13 0500  cefTRIAXone (ROCEPHIN) 1 g in dextrose 5 % 50 mL IVPB     1 g 100 mL/hr over 30 Minutes Intravenous  Once 02/21/13 0446 02/21/13 0607       PHYSICAL EXAM: Vital signs in last 24 hours: Filed Vitals:   02/22/13 1824 02/22/13 2033 02/23/13 0453 02/23/13 0848  BP: 157/90 148/65 144/79 149/82  Pulse: 89 89 83 87  Temp: 98 F (36.7 C) 97.8 F (36.6 C) 98.1 F (36.7 C) 98 F (36.7 C)  TempSrc: Oral Oral Oral  Resp: 18 18 18 18   Weight:  85.1 kg (187 lb 9.8 oz)    SpO2: 93% 94% 94% 95%    Weight change: 0.001 kg (0 oz) Filed Weights   02/21/13 0017 02/22/13 0500 02/22/13 2033  Weight: 83.9 kg (184 lb 15.5 oz) 85.1 kg (187 lb 9.8 oz) 85.1 kg (187 lb 9.8 oz)   Body mass index is 36.64 kg/(m^2).   Gen Exam: Awake and alert with clear speech.   Neck: Supple, No JVD.   Chest: B/L Clear.    CVS: S1 S2 Regular, no murmurs.  Abdomen: soft, BS +, non tender, non distended.  Extremities: no edema, lower extremities warm to touch. Neurologic: Chronic left hemiplegia-upper ext fingers with flexion contractures   Skin: No Rash.   Wounds: N/A.   Intake/Output from previous day:  Intake/Output Summary (Last 24 hours) at 02/23/13 1124 Last data filed at 02/23/13 0849  Gross per 24 hour  Intake    890 ml  Output    925 ml  Net    -35 ml     LAB RESULTS: CBC  Recent Labs Lab 02/21/13 0139 02/22/13 0430  WBC 11.8* 8.2  HGB 9.2* 8.3*  HCT 29.0* 26.1*  PLT 317 280  MCV 78.6 79.8  MCH 24.9* 25.4*  MCHC 31.7 31.8  RDW 15.4 15.5  LYMPHSABS 2.4  --   MONOABS 0.9  --   EOSABS 0.3  --   BASOSABS 0.0  --     Chemistries   Recent Labs Lab 02/21/13 0139 02/22/13 0430  NA 133* 142  K 4.8 4.1  CL 103 114*  CO2 23 22  GLUCOSE 114* 113*  BUN 35* 14  CREATININE 2.08* 0.82  CALCIUM 8.9 8.1*    CBG:  Recent Labs Lab 02/21/13 0231  GLUCAP 93    GFR The CrCl is unknown because both a height and weight (above a minimum accepted value) are required for this calculation.  Coagulation profile No results found for this basename: INR, PROTIME,  in the last 168 hours  Cardiac Enzymes No results found for this basename: CK, CKMB, TROPONINI, MYOGLOBIN,  in the last 168 hours  No components found with this basename: POCBNP,  No results found for this basename: DDIMER,  in the last 72 hours No results found for this basename: HGBA1C,  in the last 72 hours No results found for this basename: CHOL, HDL, LDLCALC, TRIG, CHOLHDL, LDLDIRECT,  in the last 72 hours No results found for this basename: TSH, T4TOTAL, FREET3, T3FREE, THYROIDAB,  in the last 72 hours No results found for this basename: VITAMINB12, FOLATE, FERRITIN, TIBC, IRON, RETICCTPCT,  in the last 72 hours No results found for this basename: LIPASE, AMYLASE,  in the last 72 hours  Urine Studies No results  found for this basename: UACOL, UAPR, USPG, UPH, UTP, UGL, UKET, UBIL, UHGB, UNIT, UROB, ULEU, UEPI, UWBC, URBC, UBAC, CAST, CRYS, UCOM, BILUA,  in the last 72 hours  MICROBIOLOGY: Recent Results (from the past 240 hour(s))  URINE CULTURE     Status: None   Collection Time    02/21/13  3:47 AM      Result Value Range Status   Specimen Description URINE, CATHETERIZED   Final   Special Requests CX ADDED AT 0408 ON [id number]   Final   Culture  Setup Time 02/21/2013 15:24   Final   Colony Count >=100,000 COLONIES/ML   Final   Culture ESCHERICHIA COLI   Final   Report  Status 02/23/2013 FINAL   Final   Organism ID, Bacteria ESCHERICHIA COLI   Final  CULTURE, BLOOD (ROUTINE X 2)     Status: None   Collection Time    02/21/13  6:53 AM      Result Value Range Status   Specimen Description BLOOD RIGHT HAND   Final   Special Requests BOTTLES DRAWN AEROBIC AND ANAEROBIC 5CC   Final   Culture  Setup Time 02/21/2013 15:12   Final   Culture     Final   Value:        BLOOD CULTURE RECEIVED NO GROWTH TO DATE CULTURE WILL BE HELD FOR 5 DAYS BEFORE ISSUING A FINAL NEGATIVE REPORT   Report Status PENDING   Incomplete  CULTURE, BLOOD (ROUTINE X 2)     Status: None   Collection Time    02/21/13 10:10 AM      Result Value Range Status   Specimen Description BLOOD LEFT HAND   Final   Special Requests BOTTLES DRAWN AEROBIC ONLY 5CC   Final   Culture  Setup Time 02/21/2013 15:10   Final   Culture     Final   Value:        BLOOD CULTURE RECEIVED NO GROWTH TO DATE CULTURE WILL BE HELD FOR 5 DAYS BEFORE ISSUING A FINAL NEGATIVE REPORT   Report Status PENDING   Incomplete  MRSA PCR SCREENING     Status: None   Collection Time    02/21/13 10:37 AM      Result Value Range Status   MRSA by PCR NEGATIVE  NEGATIVE Final   Comment:            The GeneXpert MRSA Assay (FDA     approved for NASAL specimens     only), is one component of a     comprehensive MRSA colonization     surveillance program. It is not      intended to diagnose MRSA     infection nor to guide or     monitor treatment for     MRSA infections.  CLOSTRIDIUM DIFFICILE BY PCR     Status: None   Collection Time    02/23/13  8:48 AM      Result Value Range Status   C difficile by pcr NEGATIVE  NEGATIVE Final    RADIOLOGY STUDIES/RESULTS: Dg Chest 2 View  02/21/2013   *RADIOLOGY REPORT*  Clinical Data: Altered mental status.  Fall.  CHEST - 2 VIEW  Comparison: 01/28/2012  Findings: The heart, mediastinum and hila are unremarkable.  The lungs are clear.  No pleural effusion or pneumothorax.  The bony thorax is demineralized but intact.  IMPRESSION: No acute cardiopulmonary disease.   Original Report Authenticated By: Amie Portland, M.D.   Ct Head Wo Contrast  02/21/2013   *RADIOLOGY REPORT*  Clinical Data: Fall, left side paralysis from prior stroke, history hypertension, seizure  CT HEAD WITHOUT CONTRAST  Technique:  Contiguous axial images were obtained from the base of the skull through the vertex without contrast.  Comparison: 01/28/2012  Findings: Generalized atrophy. Ex vacuo dilatation of the right lateral ventricle again identified secondary to large area of right MCA infarction involving the right parietal lobe and basal ganglia. No midline shift or mass effect. Small vessel chronic ischemic changes of deep cerebral white matter. No intracranial hemorrhage or evidence of acute infarction. No extra-axial fluid collections. Postsurgical changes of right frontoparietal craniotomy. Hyperdense nodule para falcine at the posterior right parietal  region 11 x 9 mm consistent with meningioma, unchanged. Dense calcification within anterior falx. No new intracranial mass lesions.  IMPRESSION: Large old right MCA infarct. Mild small vessel chronic ischemic changes deep cerebral white matter. Stable right parafalcine meningioma posteriorly. Postsurgical changes right frontoparietal craniotomy. No new intracranial abnormalities.   Original Report  Authenticated By: Ulyses Southward, M.D.   US Renal  02/21/2013   *RADIOLOGY REPORT*  Clinical Data: Acute renal failure  RENAL/URINARY TRACT ULTRASOUND COMPLETE  Comparison:  CT, 11/03/2010  Findings:  Right Kidney:  Right kidney is normal in overall size and echogenicity.  There is mild renal cortical thinning.  No renal masses or stones.  No hydronephrosis.  The right kidney measures 9.9 cm.  Left Kidney:  Left kidney is normal in overall size echogenicity. No left renal masses or stones or hydronephrosis.  Left kidney measures 10.2 cm.  Bladder:  Unremarkable  IMPRESSION: Mild right renal cortical thinning.  Exam is otherwise unremarkable.  No hydronephrosis.   Original Report Authenticated By: Amie Portland, M.D.    Jeoffrey Massed, MD  [facility] Pager:[phone]  If 7PM-7AM, please contact night-coverage [email or web address] Password [facility] 02/23/2013, 11:24 AM   LOS: 2 days

## 2013-02-23 NOTE — Progress Notes (Signed)
Patient cooperative until nebulizer was started. Patient then refused treatment.

## 2013-02-24 ENCOUNTER — Encounter (HOSPITAL_COMMUNITY): Payer: Self-pay | Admitting: *Deleted

## 2013-02-24 DIAGNOSIS — I059 Rheumatic mitral valve disease, unspecified: Secondary | ICD-10-CM

## 2013-02-24 DIAGNOSIS — I959 Hypotension, unspecified: Secondary | ICD-10-CM

## 2013-02-24 LAB — BASIC METABOLIC PANEL
Calcium: 8.7 mg/dL (ref 8.4–10.5)
Creatinine, Ser: 1.2 mg/dL — ABNORMAL HIGH (ref 0.50–1.10)
GFR calc Af Amer: 58 mL/min — ABNORMAL LOW (ref >90)
GFR calc non Af Amer: 50 mL/min — ABNORMAL LOW (ref >90)
Sodium: 144 mEq/L (ref 135–145)

## 2013-02-24 MED ORDER — HYDROCORTISONE SOD SUCCINATE 100 MG IJ SOLR
25.0000 mg | Freq: Three times a day (TID) | INTRAMUSCULAR | Status: DC
  Filled 2013-02-24 (×3): qty 0.5

## 2013-02-24 MED ORDER — POTASSIUM CHLORIDE CRYS ER 20 MEQ PO TBCR
40.0000 meq | EXTENDED_RELEASE_TABLET | Freq: Once | ORAL | Status: AC
Start: 2013-02-24 — End: 2013-02-24
  Administered 2013-02-24: 40 meq via ORAL
  Filled 2013-02-24: qty 2

## 2013-02-24 MED ORDER — HYDROCORTISONE SOD SUCCINATE 100 MG IJ SOLR
25.0000 mg | Freq: Three times a day (TID) | INTRAMUSCULAR | Status: DC
  Administered 2013-02-24 – 2013-02-25 (×3): 25 mg via INTRAVENOUS
  Filled 2013-02-24 (×6): qty 0.5

## 2013-02-24 NOTE — Evaluation (Signed)
Clinical/Bedside Swallow Evaluation Patient Details  Name: Jordan Rivas MRN: [id number] Date of Birth: [birth year]  Today's Date: 02/24/2013 Time: 4098-1191 SLP Time Calculation (min): 34 min  Past Medical History:  Past Medical History  Diagnosis Date  . Hypertension   . Hypercholesteremia   . Insomnia   . Neuropathy   . GERD (gastroesophageal reflux disease)   . Overactive bladder   . Peripheral vascular disease     left arm and leg; since stroke 1995  . Seizures 01/27/12    "first one ever"  . CVA (cerebrovascular accident) 1995    "left side paralyzed"  . Chronic pain     "in this left arm and leg that don't move"   Past Surgical History:  Past Surgical History  Procedure Laterality Date  . Tubal ligation  04/1984  . Cerebral aneurysm repair  11/1993    "left my left side paralyzed"   HPI:  Jordan Rivas adm to [facility] 6/21 with AMS secndary to UTI + sepsis, hypotension, ARF.  PMH + for diarhhea, fall, HTN, seizure, CVA with left hemiparesis- s/p right frontoparietal craniotomy.  CXR 6/21 negative, CXR 6/23 lower lobe pna - right more than left.  Pt had shortness of breath and chest pain on 6/23.  Swallow evaluation was ordered.    Assessment / Plan / Recommendation Clinical Impression  Pt presents with baseline oropharyngeal swallow function without clinical indications of aspiration/penetration and mild oral transiting/coordination deficits.  Based on pt's symptoms, suspect a primary esophageal dysphagia- Advised her to esophageal precautions to mitigate her esophageal deficits and advised her to follow up with Dr Madilyn Fireman (MD who did EGD approx 2-3 weeks ago per pt) upon discharge from hospital.  Pt also admits to poor appetite and issues with diarrhea.  She did consume some Boost orange flavored today with tolerance.  States she vomited Ensure the other day (without coughing or aspiration with emesis per pt).    SLP to sign off as all education is completed.      Aspiration  Risk  Mild    Diet Recommendation Thin liquid;Regular   Liquid Administration via: Cup;Straw Medication Administration:  (as tolerated) Supervision: Patient able to self feed Compensations: Slow rate;Small sips/bites;Check for pocketing Postural Changes and/or Swallow Maneuvers: Seated upright 90 degrees;Upright 30-60 min after meal (several small meals/day)    Other  Recommendations Oral Care Recommendations: Oral care BID   Follow Up Recommendations  None    Frequency and Duration        Pertinent Vitals/Pain Afebrile,respirations even    SLP Swallow Goals  n/a   Swallow Study Prior Functional Status   fed self at home prior to admit, poor appetite few days prior to admit    General Date of Onset: 02/24/13 HPI: Jordan Rivas adm to [facility] 6/21 with AMS secndary to UTI + sepsis, hypotension, ARF.  PMH + for diarhhea, fall, HTN, seizure, CVA with left hemiparesis- s/p right frontoparietal craniotomy.  CXR 6/21 negative, CXR 6/23 lower lobe pna - right more than left.  Pt had shortness of breath and chest pain on 6/23.  Swallow evaluation was ordered.  Type of Study: Bedside swallow evaluation Previous Swallow Assessment: h/o esophageal defiicts requiring EGD, last one 2-3 weeks ago without significant improvement in swallowing per pt Diet Prior to this Study: Regular;Thin liquids Respiratory Status: Room air History of Recent Intubation: No Behavior/Cognition: Alert;Cooperative Oral Cavity - Dentition: Adequate natural dentition Self-Feeding Abilities: Able to feed self Patient Positioning: Upright in bed  Baseline Vocal Quality: Clear Volitional Cough: Strong Volitional Swallow: Able to elicit    Oral/Motor/Sensory Function Overall Oral Motor/Sensory Function: Impaired at baseline Labial Symmetry: Abnormal symmetry left (minimal left decreased) Facial Sensation: Reduced   Ice Chips Ice chips: Not tested   Thin Liquid Thin Liquid: Impaired Presentation: Cup;Self  Fed;Straw Pharyngeal  Phase Impairments: Suspected delayed Swallow Other Comments: mild delay initiation    Nectar Thick Nectar Thick Liquid: Not tested   Honey Thick Honey Thick Liquid: Not tested   Puree Puree: Not tested Other Comments: pt politely declined to consume pudding   Solid   GO    Solid: Impaired Oral Phase Impairments: Impaired anterior to posterior transit;Reduced lingual movement/coordination Oral Phase Functional Implications: Left lateral sulci pocketing (minimal) Pharyngeal Phase Impairments: Suspected delayed Swallow Other Comments: pt aware of oral stasis, clearing independently       Donavan Burnet, MS [facility] SLP [phone]

## 2013-02-24 NOTE — Progress Notes (Signed)
PT Cancellation Note  Patient Details Name: Jordan Rivas MRN: [id number] DOB: [birth year]   Cancelled Treatment:    Reason Eval/Treat Not Completed: Fatigue/lethargy limiting ability to participate   Donnetta Hail 02/24/2013, 4:11 PM

## 2013-02-24 NOTE — Progress Notes (Signed)
Echocardiogram 2D Echocardiogram has been performed.  Arvil Chaco 02/24/2013, 4:14 PM

## 2013-02-24 NOTE — Progress Notes (Addendum)
PATIENT DETAILS Name: Jordan Rivas Age: 52 y.o. Sex: female Date of Birth: 1961-03-08 Admit Date: 02/21/2013 Admitting Physician Ron Parker, MD ZOX:[id number], MD  Brief Summary Patient is a 52 year old African American female with a past medical history of intracranial hemorrhage approximately 18 years ago with resultant left-sided hemiplegia, admitted to the step down unit following frequent falls, hypertension and a UTI. Patient required multiple units of IV fluid boluses, and stress dose steroids, along with antibiotics. When she was stabilized she was moved to the regular medical surgical unit. She was doing well, urine cultures are positive for pansensitive Escherichia coli. However on 6/23 she developed slight shortness of breath and wheezing, x-ray of the chest done on 6/23 was also by pneumonia. Her BNP was. It is thought that this probably a combination of  pneumonia and possible CHF decompensation. She is currently on Zosyn (previously on Rocephin), awaiting a swallow evaluation and 2-D echocardiogram. Her shortness of breath has been resolved with nebulizer treatments and one dose of IV 40 mg furosemide. She is being tapered off her hydrocortisone that was started for relative adrenal insufficiency in the setting of hypotension.  Subjective: Feels better-wants to go home  Assessment/Plan: Principal Problem:   Sepsis associated hypotension -patient was admitted to the SDU and given fluid boluses, however was still hypotensive, cortisol level was checked-it was 9-given stress dose hydrocortisone with good response. -much improved after starting hydrocortisone-BP now back to normal- will decrease hydrocortisone to 50 mg IV 3 times a day, will then we'll transition to oral prednisone and slowly taper it off -c/w Rocephin - Urine culture shows pansensitive Escherichia coli- previously on Rocephin, now transitioned to Zosyn to cover for pneumonia.  - Blood cultures  6/21-negative so far  Active Problems: UTI (lower urinary tract infection)/right-sided pyelonephritis -causing above - now on Zosyn, previously on IV Rocephin   HCAP/aspiration pneumonia - Chest x-ray on admission did not show pneumonia, however on 6/20 3 repeat x-ray on the patient with short of breath does show pneumonia. - Given her history of a prior CVA and resultant hemiplegia and mild dysarthria, there is some suspicion for aspiration pneumonia, well go ahead and continue with IV Zosyn. Check a swallow eval today. She is clinically better, suspect she could go home in the next 1-2 days on oral Augmentin.  ? Mild Decompensated CHF - BNP significantly elevated on 6/23, mild congestive findings on chest x-ray as well. She did receive his 8-9 L of IV fluid boluses on admission. - For short of breath on 6/23, this was resolved with nebulized bronchodilators and IV furosemide 40 mg x1 dose. - Not known if she has systolic versus diastolic dysfunction, we'll check a 2-D echocardiogram. Currently seeing locked.  Relative Adrenal Insufficiency -cortisol inappropriately low normal in the setting of hypotension -taper off Hydrocortisone slowly- decrease to 25 mg 3 times a day 6/24  - Blood pressure slowly creeping up, we'll need to resume antihypertensive therapy over the next 1-2 days.   ARF (acute renal failure)  - patient had significant renal failure with a peak creatinine of 2.28 on admission. Renal failure resolved with hydration. This is likely prerenal in the setting of severe sepsis - Mild bump in creatinine today, following IV loose yesterday. No further diureses today, currently saline lock. Monitor electrolytes periodically.  -renal ultrasound-neg for hydronephrosis  Altered mental status  -resolved - Likely secondary to toxic metabolite encephalopathy from narcotics and renal failure. CT of the head is negative for acute abnormalities.  Acute urinary retention  - Removed  Foley 6/23-per RN pt voiding    Fall  - Likely secondary to hypotension  - CT of the head negative for acute abnormalities -await PT eval  Diarrhea - C. difficile PCR negative -Start probiotics and Imodium prn-better today  HTN -controlled but BP slowly creeping up, resume verapamil and Benicar when able  Seizure  - Continue Keppra   . GERD (gastroesophageal reflux disease)  - Continue with PPI   Hypercholesteremia -resume statins  Chronic pain syndrome - When necessary oxycodone  -resume Lyrica, Cymbalta and Elavil  Disposition: Remain inpatient-home in 1-2 days  DVT Prophylaxis: Prophylactic  Heparin   Code Status: Full code   Family Communication Son at bedside yesterday  Procedures:  None  CONSULTS:  pulmonary/intensive care   MEDICATIONS: Scheduled Meds: . albuterol  2.5 mg Nebulization TID  . atorvastatin  20 mg Oral QPM  . DULoxetine  60 mg Oral Daily  . feeding supplement  1 Container Oral TID BM  . heparin  5,000 Units Subcutaneous Q8H  . hydrocortisone sod succinate (SOLU-CORTEF) inj  50 mg Intravenous Q8H  . levETIRAcetam  500 mg Oral BID  . oxybutynin  10 mg Oral BID  . pantoprazole  40 mg Oral BID  . piperacillin-tazobactam (ZOSYN)  IV  3.375 g Intravenous Q8H  . pregabalin  75 mg Oral BID  . saccharomyces boulardii  250 mg Oral BID  . sodium chloride  3 mL Intravenous Q12H   Continuous Infusions:   PRN Meds:.acetaminophen, acetaminophen, albuterol, loperamide, ondansetron (ZOFRAN) IV, ondansetron, oxyCODONE  Antibiotics: Anti-infectives   Start     Dose/Rate Route Frequency Ordered Stop   02/23/13 1800  piperacillin-tazobactam (ZOSYN) IVPB 3.375 g     3.375 g 12.5 mL/hr over 240 Minutes Intravenous Every 8 hours 02/23/13 1640     02/21/13 1000  cefTRIAXone (ROCEPHIN) 1 g in dextrose 5 % 50 mL IVPB  Status:  Discontinued     1 g 100 mL/hr over 30 Minutes Intravenous Every 24 hours 02/21/13 0914 02/23/13 1636   02/21/13 0500   cefTRIAXone (ROCEPHIN) 1 g in dextrose 5 % 50 mL IVPB     1 g 100 mL/hr over 30 Minutes Intravenous  Once 02/21/13 0446 02/21/13 0607       PHYSICAL EXAM: Vital signs in last 24 hours: Filed Vitals:   02/23/13 1827 02/23/13 2043 02/23/13 2053 02/24/13 0500  BP: 149/74  142/70 144/113  Pulse: 82  80 72  Temp: 97.8 F (36.6 C)  98.1 F (36.7 C) 97.6 F (36.4 C)  TempSrc:   Oral Oral  Resp: 18  18 18   Height:   5\' 1"  (1.549 m)   Weight:   85.1 kg (187 lb 9.8 oz)   SpO2: 91% 95% 100% 98%    Weight change: 0 kg (0 lb) Filed Weights   02/22/13 0500 02/22/13 2033 02/23/13 2053  Weight: 85.1 kg (187 lb 9.8 oz) 85.1 kg (187 lb 9.8 oz) 85.1 kg (187 lb 9.8 oz)   Body mass index is 35.47 kg/(m^2).   Gen Exam: Awake and alert with clear speech.   Neck: Supple, No JVD.   Chest: B/L Clear.   CVS: S1 S2 Regular, no murmurs.  Abdomen: soft, BS +, non tender, non distended.  Extremities: no edema, lower extremities warm to touch. Neurologic: Chronic left hemiplegia-upper ext fingers with flexion contractures   Skin: No Rash.   Wounds: N/A.   Intake/Output from previous day:  Intake/Output Summary (  Last 24 hours) at 02/24/13 1000 Last data filed at 02/24/13 0981  Gross per 24 hour  Intake    780 ml  Output     50 ml  Net    730 ml     LAB RESULTS: CBC  Recent Labs Lab 02/21/13 0139 02/22/13 0430  WBC 11.8* 8.2  HGB 9.2* 8.3*  HCT 29.0* 26.1*  PLT 317 280  MCV 78.6 79.8  MCH 24.9* 25.4*  MCHC 31.7 31.8  RDW 15.4 15.5  LYMPHSABS 2.4  --   MONOABS 0.9  --   EOSABS 0.3  --   BASOSABS 0.0  --     Chemistries   Recent Labs Lab 02/21/13 0139 02/22/13 0430 02/23/13 1359 02/24/13 0556  NA 133* 142 142 144  K 4.8 4.1 3.4* 3.3*  CL 103 114* 111 110  CO2 23 22 23 23   GLUCOSE 114* 113* 165* 104*  BUN 35* 14 20 21   CREATININE 2.08* 0.82 1.02 1.20*  CALCIUM 8.9 8.1* 8.5 8.7    CBG:  Recent Labs Lab 02/21/13 0231  GLUCAP 93    GFR Estimated Creatinine  Clearance: 53 ml/min (by C-G formula based on Cr of 1.2).  Coagulation profile No results found for this basename: INR, PROTIME,  in the last 168 hours  Cardiac Enzymes No results found for this basename: CK, CKMB, TROPONINI, MYOGLOBIN,  in the last 168 hours  No components found with this basename: POCBNP,  No results found for this basename: DDIMER,  in the last 72 hours No results found for this basename: HGBA1C,  in the last 72 hours No results found for this basename: CHOL, HDL, LDLCALC, TRIG, CHOLHDL, LDLDIRECT,  in the last 72 hours No results found for this basename: TSH, T4TOTAL, FREET3, T3FREE, THYROIDAB,  in the last 72 hours No results found for this basename: VITAMINB12, FOLATE, FERRITIN, TIBC, IRON, RETICCTPCT,  in the last 72 hours No results found for this basename: LIPASE, AMYLASE,  in the last 72 hours  Urine Studies No results found for this basename: UACOL, UAPR, USPG, UPH, UTP, UGL, UKET, UBIL, UHGB, UNIT, UROB, ULEU, UEPI, UWBC, URBC, UBAC, CAST, CRYS, UCOM, BILUA,  in the last 72 hours  MICROBIOLOGY: Recent Results (from the past 240 hour(s))  URINE CULTURE     Status: None   Collection Time    02/21/13  3:47 AM      Result Value Range Status   Specimen Description URINE, CATHETERIZED   Final   Special Requests CX ADDED AT 0408 ON 191478   Final   Culture  Setup Time 02/21/2013 15:24   Final   Colony Count >=100,000 COLONIES/ML   Final   Culture ESCHERICHIA COLI   Final   Report Status 02/23/2013 FINAL   Final   Organism ID, Bacteria ESCHERICHIA COLI   Final  CULTURE, BLOOD (ROUTINE X 2)     Status: None   Collection Time    02/21/13  6:53 AM      Result Value Range Status   Specimen Description BLOOD RIGHT HAND   Final   Special Requests BOTTLES DRAWN AEROBIC AND ANAEROBIC 5CC   Final   Culture  Setup Time 02/21/2013 15:12   Final   Culture     Final   Value:        BLOOD CULTURE RECEIVED NO GROWTH TO DATE CULTURE WILL BE HELD FOR 5 DAYS BEFORE ISSUING  A FINAL NEGATIVE REPORT   Report Status PENDING   Incomplete  CULTURE, BLOOD (ROUTINE X 2)     Status: None   Collection Time    02/21/13 10:10 AM      Result Value Range Status   Specimen Description BLOOD LEFT HAND   Final   Special Requests BOTTLES DRAWN AEROBIC ONLY 5CC   Final   Culture  Setup Time 02/21/2013 15:10   Final   Culture     Final   Value:        BLOOD CULTURE RECEIVED NO GROWTH TO DATE CULTURE WILL BE HELD FOR 5 DAYS BEFORE ISSUING A FINAL NEGATIVE REPORT   Report Status PENDING   Incomplete  MRSA PCR SCREENING     Status: None   Collection Time    02/21/13 10:37 AM      Result Value Range Status   MRSA by PCR NEGATIVE  NEGATIVE Final   Comment:            The GeneXpert MRSA Assay (FDA     approved for NASAL specimens     only), is one component of a     comprehensive MRSA colonization     surveillance program. It is not     intended to diagnose MRSA     infection nor to guide or     monitor treatment for     MRSA infections.  CLOSTRIDIUM DIFFICILE BY PCR     Status: None   Collection Time    02/23/13  8:48 AM      Result Value Range Status   C difficile by pcr NEGATIVE  NEGATIVE Final    RADIOLOGY STUDIES/RESULTS: Dg Chest 2 View  02/21/2013   *RADIOLOGY REPORT*  Clinical Data: Altered mental status.  Fall.  CHEST - 2 VIEW  Comparison: 01/28/2012  Findings: The heart, mediastinum and hila are unremarkable.  The lungs are clear.  No pleural effusion or pneumothorax.  The bony thorax is demineralized but intact.  IMPRESSION: No acute cardiopulmonary disease.   Original Report Authenticated By: Amie Portland, M.D.   Ct Head Wo Contrast  02/21/2013   *RADIOLOGY REPORT*  Clinical Data: Fall, left side paralysis from prior stroke, history hypertension, seizure  CT HEAD WITHOUT CONTRAST  Technique:  Contiguous axial images were obtained from the base of the skull through the vertex without contrast.  Comparison: 01/28/2012  Findings: Generalized atrophy. Ex vacuo  dilatation of the right lateral ventricle again identified secondary to large area of right MCA infarction involving the right parietal lobe and basal ganglia. No midline shift or mass effect. Small vessel chronic ischemic changes of deep cerebral white matter. No intracranial hemorrhage or evidence of acute infarction. No extra-axial fluid collections. Postsurgical changes of right frontoparietal craniotomy. Hyperdense nodule para falcine at the posterior right parietal region 11 x 9 mm consistent with meningioma, unchanged. Dense calcification within anterior falx. No new intracranial mass lesions.  IMPRESSION: Large old right MCA infarct. Mild small vessel chronic ischemic changes deep cerebral white matter. Stable right parafalcine meningioma posteriorly. Postsurgical changes right frontoparietal craniotomy. No new intracranial abnormalities.   Original Report Authenticated By: Ulyses Southward, M.D.   US Renal  02/21/2013   *RADIOLOGY REPORT*  Clinical Data: Acute renal failure  RENAL/URINARY TRACT ULTRASOUND COMPLETE  Comparison:  CT, 11/03/2010  Findings:  Right Kidney:  Right kidney is normal in overall size and echogenicity.  There is mild renal cortical thinning.  No renal masses or stones.  No hydronephrosis.  The right kidney measures 9.9 cm.  Left Kidney:  Left kidney  is normal in overall size echogenicity. No left renal masses or stones or hydronephrosis.  Left kidney measures 10.2 cm.  Bladder:  Unremarkable  IMPRESSION: Mild right renal cortical thinning.  Exam is otherwise unremarkable.  No hydronephrosis.   Original Report Authenticated By: Amie Portland, M.D.    Jeoffrey Massed, MD  [facility] Pager:[phone]  If 7PM-7AM, please contact night-coverage [email or web address] Password [email or web address] 02/24/2013, 10:00 AM   LOS: 3 days

## 2013-02-25 DIAGNOSIS — I10 Essential (primary) hypertension: Secondary | ICD-10-CM

## 2013-02-25 DIAGNOSIS — I5041 Acute combined systolic (congestive) and diastolic (congestive) heart failure: Secondary | ICD-10-CM | POA: Diagnosis present

## 2013-02-25 DIAGNOSIS — I509 Heart failure, unspecified: Secondary | ICD-10-CM

## 2013-02-25 DIAGNOSIS — Z8673 Personal history of transient ischemic attack (TIA), and cerebral infarction without residual deficits: Secondary | ICD-10-CM

## 2013-02-25 LAB — BASIC METABOLIC PANEL
CO2: 22 mEq/L (ref 19–32)
Chloride: 109 mEq/L (ref 96–112)
Creatinine, Ser: 1.31 mg/dL — ABNORMAL HIGH (ref 0.50–1.10)
GFR calc Af Amer: 52 mL/min — ABNORMAL LOW (ref >90)
Sodium: 143 mEq/L (ref 135–145)

## 2013-02-25 LAB — TROPONIN I: Troponin I: 0.33 ng/mL (ref <0.30)

## 2013-02-25 LAB — CBC
Platelets: 284 10*3/uL (ref 150–400)
RBC: 3.47 MIL/uL — ABNORMAL LOW (ref 3.87–5.11)
RDW: 15.5 % (ref 11.5–15.5)
WBC: 11.9 10*3/uL — ABNORMAL HIGH (ref 4.0–10.5)

## 2013-02-25 MED ORDER — PREDNISONE 20 MG PO TABS
40.0000 mg | ORAL_TABLET | Freq: Every day | ORAL | Status: DC
  Administered 2013-02-25 – 2013-02-26 (×2): 40 mg via ORAL
  Filled 2013-02-25 (×3): qty 2

## 2013-02-25 MED ORDER — POTASSIUM CHLORIDE CRYS ER 20 MEQ PO TBCR
40.0000 meq | EXTENDED_RELEASE_TABLET | Freq: Once | ORAL | Status: AC
  Administered 2013-02-25: 40 meq via ORAL
  Filled 2013-02-25: qty 2

## 2013-02-25 NOTE — Progress Notes (Signed)
Physical Therapy Treatment Patient Details Name: Jordan Rivas MRN: [id number] DOB: 1961-05-28 Today's Date: 02/25/2013 Time: 4098-1191 PT Time Calculation (min): 24 min  PT Assessment / Plan / Recommendation  PT Comments   Pt now with dx of pna and is using supplemental O2.  She was able to increase walking distance today.  Anticipate whe will need 24/7 assist at first, especially with ADLs and applying shoe and brace.  Please order OT consult.   Follow Up Recommendations  Home health PT;Supervision - Intermittent (if slow progress, must consider SNF)     Does the patient have the potential to tolerate intense rehabilitation     Barriers to Discharge        Equipment Recommendations  Other (comment) (consider 3in1, tub bench)    Recommendations for Other Services OT consult  Frequency Min 3X/week   Progress towards PT Goals Progress towards PT goals: Progressing toward goals;Goals updated - see care plan  Plan Current plan remains appropriate    Precautions / Restrictions Precautions Precautions: Fall Restrictions Weight Bearing Restrictions: No   Pertinent Vitals/Pain No c/o pain today    Mobility  Bed Mobility Bed Mobility: Rolling Right;Rolling Left;Right Sidelying to Sit;Sitting - Scoot to Edge of Bed;Sit to Supine Rolling Right: 5: Supervision;With rail Rolling Left: 5: Supervision;With rail Right Sidelying to Sit: With rails;5: Supervision Sitting - Scoot to Edge of Bed: 5: Supervision Sit to Supine: 3: Mod assist Details for Bed Mobility Assistance: Pt has some difficulty maneuvering on hospital mattress Transfers Transfers: Sit to Stand;Stand to Sit Sit to Stand: With upper extremity assist;From bed;5: Supervision;From chair/3-in-1 Stand to Sit: To bed;5: Supervision;With upper extremity assist;To chair/3-in-1 Stand Pivot Transfers: Other (comment) Details for Transfer Assistance: pt needs extra time for all mobility.  Needed assist to put AFO and shoe on foot  thougth pt insisted she was able to do it Ambulation/Gait Ambulation/Gait Assistance: 5: Supervision Ambulation Distance (Feet): 75 Feet Assistive device: Straight cane Ambulation/Gait Assistance Details: assist only to bring up IV pole, O2 tank, and pull up chair behind her Gait Pattern: Step-to pattern;Step-through pattern;Decreased step length - left;Decreased stance time - left;Decreased stride length;Decreased hip/knee flexion - left;Decreased dorsiflexion - left;Decreased weight shift to left General Gait Details: Pt has left  hemiplegic gait pattern with use of AFO, shoe and straight cane. Pt has difficulty advancing left leg, but she reports she is close to baseline and will be able to take care of herself at home    Exercises     PT Diagnosis:    PT Problem List:   PT Treatment Interventions:     PT Goals (current goals can now be found in the care plan section) Acute Rehab PT Goals Patient Stated Goal: to return home PT Goal Formulation: With patient Time For Goal Achievement: 03/08/13 Potential to Achieve Goals: Good  Visit Information  Last PT Received On: 02/25/13 Assistance Needed: +1    Subjective Data  Patient Stated Goal: to return home   Cognition  Cognition Arousal/Alertness: Awake/alert Overall Cognitive Status: Within Functional Limits for tasks assessed    Balance  Balance Balance Assessed: Yes Static Standing Balance Static Standing - Balance Support: Right upper extremity supported Static Standing - Level of Assistance: 6: Modified independent (Device/Increase time)  End of Session PT - End of Session Equipment Utilized During Treatment: Oxygen;Other (comment) (L AFO with shoe, other shoe also applied) Activity Tolerance: Patient limited by fatigue Patient left: with call bell/phone within reach;in chair Nurse Communication: Mobility status  GP     Donnetta Hail 02/25/2013, 2:25 PM

## 2013-02-25 NOTE — Progress Notes (Signed)
[facility]  PROGRESS NOTE    PATIENT DETAILS Name: Jordan Rivas Age: 52 y.o. Sex: female Date of Birth: 23-Aug-1961 Admit Date: 02/21/2013 Admitting Physician Ron Parker, MD ZOX:[id number], MD  Brief Summary Patient is a 52 year old African American female with a past medical history of intracranial hemorrhage approximately 18 years ago with resultant left-sided hemiplegia, admitted to the step down unit following frequent falls, hypertension and a UTI. Patient required multiple units of IV fluid boluses, and stress dose steroids, along with antibiotics. When she was stabilized she was moved to the regular medical surgical unit. She was doing well, urine cultures are positive for pansensitive Escherichia coli. However on 6/23 she developed slight shortness of breath and wheezing, x-ray of the chest done on 6/23 was also by pneumonia. Her BNP was. It is thought that this probably a combination of  pneumonia and possible CHF decompensation. She is currently on Zosyn (previously on Rocephin), awaiting a swallow evaluation and 2-D echocardiogram. Her shortness of breath has been resolved with nebulizer treatments and one dose of IV 40 mg furosemide. She is being tapered off her hydrocortisone that was started for relative adrenal insufficiency in the setting of hypotension.  Subjective: No SOB, no CP  Assessment/Plan:    Sepsis associated hypotension -patient was admitted to the SDU and given fluid boluses, however was still hypotensive, cortisol level was checked-it was 9-given stress dose hydrocortisone with good response. -much improved after starting hydrocortisone-BP now back to normal- will transition of IV to PO steroids - Urine culture shows pansensitive Escherichia coli- previously on Rocephin, now transitioned to Zosyn to cover for pneumonia.  - Blood cultures 6/21-negative so far   UTI (lower urinary tract infection)/right-sided pyelonephritis -causing above -  now on Zosyn, previously on IV Rocephin   HCAP/aspiration pneumonia - Chest x-ray on admission did not show pneumonia, however on 6/23 a repeat x-ray on the patient with short of breath does show pneumonia. - Given her history of a prior CVA and resultant hemiplegia and mild dysarthria, there is some suspicion for aspiration pneumonia, well go ahead and continue with IV Zosyn. Check a swallow eval today. She is clinically better, home in the next 1-2 days on oral Augmentin.  ? Mild Decompensated CHF - BNP significantly elevated on 6/23, mild congestive findings on chest x-ray as well. She did receive his 8-9 L of IV fluid boluses on admission. - For short of breath on 6/23, this was resolved with nebulized bronchodilators and IV furosemide 40 mg x1 dose. - ask cardiology to see: echo done 6/22 shows no wall abnormalities and EF 55% and echo done 6/24 shows diffuse hypokinesis and EF 40-45%  Relative Adrenal Insufficiency -cortisol inappropriately low normal in the setting of hypotension -taper off Hydrocortisone slowly- decrease to 25 mg 3 times a day 6/24 - wean to PO  ARF (acute renal failure)  - patient had significant renal failure with a peak creatinine of 2.28 on admission. Renal failure resolved with hydration. This is likely prerenal in the setting of severe sepsis - Mild bump in creatinine today, following IV lasix yesterday. No further diureses today, currently saline lock. Monitor electrolytes periodically.  -renal ultrasound-neg for hydronephrosis  Altered mental status  -resolved - Likely secondary to toxic metabolite encephalopathy from narcotics and renal failure. CT of the head is negative for acute abnormalities.   Acute urinary retention  - Removed Foley 6/23-per RN pt voiding    Fall  - Likely secondary to hypotension  - CT  of the head negative for acute abnormalities -PT recs of home health  Diarrhea - C. difficile PCR negative -Start probiotics and Imodium  prn-better today  HTN -controlled but BP slowly creeping up, resume verapamil and Benicar when able  Seizure  - Continue Keppra   . GERD (gastroesophageal reflux disease)  - Continue with PPI   Hypercholesteremia -resume statins  Chronic pain syndrome - When necessary oxycodone  -resume Lyrica, Cymbalta and Elavil  Hypokalemia -replace  DVT Prophylaxis: Prophylactic  Heparin   Code Status: Full code   Family Communication Son at bedside yesterday  Procedures:  None  CONSULTS:  pulmonary/intensive care   MEDICATIONS: Scheduled Meds: . atorvastatin  20 mg Oral QPM  . DULoxetine  60 mg Oral Daily  . feeding supplement  1 Container Oral TID BM  . heparin  5,000 Units Subcutaneous Q8H  . hydrocortisone sod succinate (SOLU-CORTEF) inj  25 mg Intravenous Q8H  . levETIRAcetam  500 mg Oral BID  . oxybutynin  10 mg Oral BID  . pantoprazole  40 mg Oral BID  . piperacillin-tazobactam (ZOSYN)  IV  3.375 g Intravenous Q8H  . pregabalin  75 mg Oral BID  . saccharomyces boulardii  250 mg Oral BID  . sodium chloride  3 mL Intravenous Q12H   Continuous Infusions:   PRN Meds:.acetaminophen, acetaminophen, albuterol, loperamide, ondansetron (ZOFRAN) IV, ondansetron, oxyCODONE  Antibiotics: Anti-infectives   Start     Dose/Rate Route Frequency Ordered Stop   02/23/13 1800  piperacillin-tazobactam (ZOSYN) IVPB 3.375 g     3.375 g 12.5 mL/hr over 240 Minutes Intravenous Every 8 hours 02/23/13 1640     02/21/13 1000  cefTRIAXone (ROCEPHIN) 1 g in dextrose 5 % 50 mL IVPB  Status:  Discontinued     1 g 100 mL/hr over 30 Minutes Intravenous Every 24 hours 02/21/13 0914 02/23/13 1636   02/21/13 0500  cefTRIAXone (ROCEPHIN) 1 g in dextrose 5 % 50 mL IVPB     1 g 100 mL/hr over 30 Minutes Intravenous  Once 02/21/13 0446 02/21/13 0607       PHYSICAL EXAM: Vital signs in last 24 hours: Filed Vitals:   02/24/13 2135 02/25/13 0002 02/25/13 0500 02/25/13 0632  BP: 150/92    138/84  Pulse: 77   78  Temp: 97.8 F (36.6 C)   98.3 F (36.8 C)  TempSrc: Oral   Oral  Resp: 20   20  Height:      Weight: 82.7 kg (182 lb 5.1 oz)  83.2 kg (183 lb 6.8 oz)   SpO2: 95% 96%  91%    Weight change: -2.4 kg (-5 lb 4.7 oz) Filed Weights   02/23/13 2053 02/24/13 2135 02/25/13 0500  Weight: 85.1 kg (187 lb 9.8 oz) 82.7 kg (182 lb 5.1 oz) 83.2 kg (183 lb 6.8 oz)   Body mass index is 34.68 kg/(m^2).   Gen Exam: Awake and alert with clear speech.   Neck: Supple, No JVD.   Chest: B/L Clear.   CVS: S1 S2 Regular, no murmurs.  Abdomen: soft, BS +, non tender, non distended.  Extremities: no edema, lower extremities warm to touch. Neurologic: Chronic left hemiplegia-upper ext fingers with flexion contractures   Skin: No Rash.   Wounds: N/A.   Intake/Output from previous day:  Intake/Output Summary (Last 24 hours) at 02/25/13 0842 Last data filed at 02/24/13 2300  Gross per 24 hour  Intake    220 ml  Output      0 ml  Net    220 ml     LAB RESULTS: CBC  Recent Labs Lab 02/21/13 0139 02/22/13 0430  WBC 11.8* 8.2  HGB 9.2* 8.3*  HCT 29.0* 26.1*  PLT 317 280  MCV 78.6 79.8  MCH 24.9* 25.4*  MCHC 31.7 31.8  RDW 15.4 15.5  LYMPHSABS 2.4  --   MONOABS 0.9  --   EOSABS 0.3  --   BASOSABS 0.0  --     Chemistries   Recent Labs Lab 02/21/13 0139 02/22/13 0430 02/23/13 1359 02/24/13 0556  NA 133* 142 142 144  K 4.8 4.1 3.4* 3.3*  CL 103 114* 111 110  CO2 23 22 23 23   GLUCOSE 114* 113* 165* 104*  BUN 35* 14 20 21   CREATININE 2.08* 0.82 1.02 1.20*  CALCIUM 8.9 8.1* 8.5 8.7    CBG:  Recent Labs Lab 02/21/13 0231  GLUCAP 93    GFR Estimated Creatinine Clearance: 52.5 ml/min (by C-G formula based on Cr of 1.2).  Coagulation profile No results found for this basename: INR, PROTIME,  in the last 168 hours  Cardiac Enzymes No results found for this basename: CK, CKMB, TROPONINI, MYOGLOBIN,  in the last 168 hours  No components found with  this basename: POCBNP,  No results found for this basename: DDIMER,  in the last 72 hours No results found for this basename: HGBA1C,  in the last 72 hours No results found for this basename: CHOL, HDL, LDLCALC, TRIG, CHOLHDL, LDLDIRECT,  in the last 72 hours No results found for this basename: TSH, T4TOTAL, FREET3, T3FREE, THYROIDAB,  in the last 72 hours No results found for this basename: VITAMINB12, FOLATE, FERRITIN, TIBC, IRON, RETICCTPCT,  in the last 72 hours No results found for this basename: LIPASE, AMYLASE,  in the last 72 hours  Urine Studies No results found for this basename: UACOL, UAPR, USPG, UPH, UTP, UGL, UKET, UBIL, UHGB, UNIT, UROB, ULEU, UEPI, UWBC, URBC, UBAC, CAST, CRYS, UCOM, BILUA,  in the last 72 hours  MICROBIOLOGY: Recent Results (from the past 240 hour(s))  URINE CULTURE     Status: None   Collection Time    02/21/13  3:47 AM      Result Value Range Status   Specimen Description URINE, CATHETERIZED   Final   Special Requests CX ADDED AT 0408 ON [id number]   Final   Culture  Setup Time 02/21/2013 15:24   Final   Colony Count >=100,000 COLONIES/ML   Final   Culture ESCHERICHIA COLI   Final   Report Status 02/23/2013 FINAL   Final   Organism ID, Bacteria ESCHERICHIA COLI   Final  CULTURE, BLOOD (ROUTINE X 2)     Status: None   Collection Time    02/21/13  6:53 AM      Result Value Range Status   Specimen Description BLOOD RIGHT HAND   Final   Special Requests BOTTLES DRAWN AEROBIC AND ANAEROBIC 5CC   Final   Culture  Setup Time 02/21/2013 15:12   Final   Culture     Final   Value:        BLOOD CULTURE RECEIVED NO GROWTH TO DATE CULTURE WILL BE HELD FOR 5 DAYS BEFORE ISSUING A FINAL NEGATIVE REPORT   Report Status PENDING   Incomplete  CULTURE, BLOOD (ROUTINE X 2)     Status: None   Collection Time    02/21/13 10:10 AM      Result Value Range Status   Specimen Description  BLOOD LEFT HAND   Final   Special Requests BOTTLES DRAWN AEROBIC ONLY 5CC   Final    Culture  Setup Time 02/21/2013 15:10   Final   Culture     Final   Value:        BLOOD CULTURE RECEIVED NO GROWTH TO DATE CULTURE WILL BE HELD FOR 5 DAYS BEFORE ISSUING A FINAL NEGATIVE REPORT   Report Status PENDING   Incomplete  MRSA PCR SCREENING     Status: None   Collection Time    02/21/13 10:37 AM      Result Value Range Status   MRSA by PCR NEGATIVE  NEGATIVE Final   Comment:            The GeneXpert MRSA Assay (FDA     approved for NASAL specimens     only), is one component of a     comprehensive MRSA colonization     surveillance program. It is not     intended to diagnose MRSA     infection nor to guide or     monitor treatment for     MRSA infections.  CLOSTRIDIUM DIFFICILE BY PCR     Status: None   Collection Time    02/23/13  8:48 AM      Result Value Range Status   C difficile by pcr NEGATIVE  NEGATIVE Final    RADIOLOGY STUDIES/RESULTS: Dg Chest 2 View  02/21/2013   *RADIOLOGY REPORT*  Clinical Data: Altered mental status.  Fall.  CHEST - 2 VIEW  Comparison: 01/28/2012  Findings: The heart, mediastinum and hila are unremarkable.  The lungs are clear.  No pleural effusion or pneumothorax.  The bony thorax is demineralized but intact.  IMPRESSION: No acute cardiopulmonary disease.   Original Report Authenticated By: Amie Portland, M.D.   Ct Head Wo Contrast  02/21/2013   *RADIOLOGY REPORT*  Clinical Data: Fall, left side paralysis from prior stroke, history hypertension, seizure  CT HEAD WITHOUT CONTRAST  Technique:  Contiguous axial images were obtained from the base of the skull through the vertex without contrast.  Comparison: 01/28/2012  Findings: Generalized atrophy. Ex vacuo dilatation of the right lateral ventricle again identified secondary to large area of right MCA infarction involving the right parietal lobe and basal ganglia. No midline shift or mass effect. Small vessel chronic ischemic changes of deep cerebral white matter. No intracranial hemorrhage or  evidence of acute infarction. No extra-axial fluid collections. Postsurgical changes of right frontoparietal craniotomy. Hyperdense nodule para falcine at the posterior right parietal region 11 x 9 mm consistent with meningioma, unchanged. Dense calcification within anterior falx. No new intracranial mass lesions.  IMPRESSION: Large old right MCA infarct. Mild small vessel chronic ischemic changes deep cerebral white matter. Stable right parafalcine meningioma posteriorly. Postsurgical changes right frontoparietal craniotomy. No new intracranial abnormalities.   Original Report Authenticated By: Ulyses Southward, M.D.   US Renal  02/21/2013   *RADIOLOGY REPORT*  Clinical Data: Acute renal failure  RENAL/URINARY TRACT ULTRASOUND COMPLETE  Comparison:  CT, 11/03/2010  Findings:  Right Kidney:  Right kidney is normal in overall size and echogenicity.  There is mild renal cortical thinning.  No renal masses or stones.  No hydronephrosis.  The right kidney measures 9.9 cm.  Left Kidney:  Left kidney is normal in overall size echogenicity. No left renal masses or stones or hydronephrosis.  Left kidney measures 10.2 cm.  Bladder:  Unremarkable  IMPRESSION: Mild right renal cortical thinning.  Exam  is otherwise unremarkable.  No hydronephrosis.   Original Report Authenticated By: Amie Portland, M.D.    Benjamine Mola, Shanda Bumps,    [phone]  If 7PM-7AM, please contact night-coverage [email or web address] Password [facility] 02/25/2013, 8:42 AM   LOS: 4 days

## 2013-02-25 NOTE — Consult Note (Signed)
Reason for Consult: Mild decompensated CHF Referring Physician: TR[facility]  HPI: the patient is a 52 y/o AAF with a past medical history significant for intercranial hemorrhage 18 years ago, which resulted in left-sided hemiplegia. She was admitted by [facility] on 6/21 for sepsis associated hypotension, felt to be caused by a UTI. She has been treated with IV antibiotics as well a 8-9 L of IV fluid boluses on admission. Subsequently, she developed shortness of breath. An CXR demonstrated mild congestive findings and her BNP was elevated at 15K. An echo was obtained which showed diffuse hypokinesis and an EF of 40-45%. This was changed from an echo 2 days prior, which was normal. She apparently was given 1 dose of IV Lasix and has had improvement in her breathing. She denies chest pain. No LEE.    Past Medical History  Diagnosis Date  . Hypertension   . Hypercholesteremia   . Insomnia   . Neuropathy   . GERD (gastroesophageal reflux disease)   . Overactive bladder   . Peripheral vascular disease     left arm and leg; since stroke 1995  . Seizures 01/27/12    "first one ever"  . CVA (cerebrovascular accident) 1995    "left side paralyzed"  . Chronic pain     "in this left arm and leg that don't move"    Past Surgical History  Procedure Laterality Date  . Tubal ligation  04/1984  . Cerebral aneurysm repair  11/1993    "left my left side paralyzed"    Family History  Problem Relation Age of Onset  . Dementia Mother   . Diabetes type II Mother   . Heart attack Father     Social History:  reports that she has never smoked. She has never used smokeless tobacco. She reports that she does not drink alcohol or use illicit drugs.  Allergies:  Allergies  Allergen Reactions  . Celebrex (Celecoxib) Swelling    "lips; it was terrible"    Medications:  Prior to Admission:  Prescriptions prior to admission  Medication Sig Dispense Refill  . amitriptyline (ELAVIL) 25 MG tablet Take 75 mg by mouth at  bedtime.      Marland Kitchen atorvastatin (LIPITOR) 20 MG tablet Take 20 mg by mouth daily.      . DULoxetine (CYMBALTA) 60 MG capsule Take 60 mg by mouth daily.      Marland Kitchen esomeprazole (NEXIUM) 40 MG capsule Take 40 mg by mouth 2 (two) times daily.      Marland Kitchen olmesartan (BENICAR) 20 MG tablet Take 20 mg by mouth at bedtime.      Marland Kitchen oxybutynin (DITROPAN-XL) 10 MG 24 hr tablet Take 10 mg by mouth 2 (two) times daily.      Marland Kitchen oxyCODONE (OXY IR/ROXICODONE) 5 MG immediate release tablet Take 5 mg by mouth every 8 (eight) hours as needed. For pain      . pregabalin (LYRICA) 75 MG capsule Take 75 mg by mouth 2 (two) times daily.      . verapamil (CALAN-SR) 240 MG CR tablet Take 240 mg by mouth at bedtime.      . levETIRAcetam (KEPPRA) 500 MG tablet Take 1 tablet (500 mg total) by mouth 2 (two) times daily.  60 tablet  2    Results for orders placed during the hospital encounter of 02/21/13 (from the past 48 hour(s))  BASIC METABOLIC PANEL     Status: Abnormal   Collection Time    02/24/13  5:56 AM  Result Value Range   Sodium 144  135 - 145 mEq/L   Potassium 3.3 (*) 3.5 - 5.1 mEq/L   Chloride 110  96 - 112 mEq/L   CO2 23  19 - 32 mEq/L   Glucose, Bld 104 (*) 70 - 99 mg/dL   BUN 21  6 - 23 mg/dL   Creatinine, Ser 1.61 (*) 0.50 - 1.10 mg/dL   Calcium 8.7  8.4 - 09.6 mg/dL   GFR calc non Af Amer 50 (*) >90 mL/min   GFR calc Af Amer 58 (*) >90 mL/min   Comment:            The eGFR has been calculated     using the CKD EPI equation.     This calculation has not been     validated in all clinical     situations.     eGFR's persistently     <90 mL/min signify     possible Chronic Kidney Disease.  CBC     Status: Abnormal   Collection Time    02/25/13  9:40 AM      Result Value Range   WBC 11.9 (*) 4.0 - 10.5 K/uL   RBC 3.47 (*) 3.87 - 5.11 MIL/uL   Hemoglobin 8.6 (*) 12.0 - 15.0 g/dL   HCT 04.5 (*) 40.9 - 81.1 %   MCV 76.1 (*) 78.0 - 100.0 fL   MCH 24.8 (*) 26.0 - 34.0 pg   MCHC 32.6  30.0 - 36.0 g/dL    RDW 91.4  78.2 - 95.6 %   Platelets 284  150 - 400 K/uL  BASIC METABOLIC PANEL     Status: Abnormal   Collection Time    02/25/13  9:40 AM      Result Value Range   Sodium 143  135 - 145 mEq/L   Potassium 3.2 (*) 3.5 - 5.1 mEq/L   Chloride 109  96 - 112 mEq/L   CO2 22  19 - 32 mEq/L   Glucose, Bld 144 (*) 70 - 99 mg/dL   BUN 19  6 - 23 mg/dL   Creatinine, Ser 2.13 (*) 0.50 - 1.10 mg/dL   Calcium 8.5  8.4 - 08.6 mg/dL   GFR calc non Af Amer 45 (*) >90 mL/min   GFR calc Af Amer 52 (*) >90 mL/min   Comment:            The eGFR has been calculated     using the CKD EPI equation.     This calculation has not been     validated in all clinical     situations.     eGFR's persistently     <90 mL/min signify     possible Chronic Kidney Disease.  TROPONIN I     Status: Abnormal   Collection Time    02/25/13  9:40 AM      Result Value Range   Troponin I 0.33 (*) <0.30 ng/mL   Comment:            Due to the release kinetics of cTnI,     a negative result within the first hours     of the onset of symptoms does not rule out     myocardial infarction with certainty.     If myocardial infarction is still suspected,     repeat the test at appropriate intervals.     CRITICAL RESULT CALLED TO, READ BACK BY AND VERIFIED WITH:  PENNINGTON,P RN @ 1045 ON 02/25/13 BY LEONARD,A   BNP (last 3 results)  Recent Labs  02/23/13 1359  PROBNP 15214.0*   No results found.  Review of Systems  Respiratory: Positive for shortness of breath.   Cardiovascular: Negative for chest pain, orthopnea, leg swelling and PND.  All other systems reviewed and are negative.   Blood pressure 128/72, pulse 74, temperature 98 F (36.7 C), temperature source Oral, resp. rate 20, height 5\' 1"  (1.549 m), weight 183 lb 6.8 oz (83.2 kg), last menstrual period 09/30/2011, SpO2 98.00%. Physical Exam  Constitutional: She appears well-developed and well-nourished. No distress.  Eyes: Conjunctivae and EOM are  normal. Pupils are equal, round, and reactive to light.  Neck: No JVD present.  Cardiovascular: Normal rate, regular rhythm, normal heart sounds and intact distal pulses.  Exam reveals no gallop and no friction rub.   No murmur heard. Respiratory: Effort normal and breath sounds normal. No respiratory distress. She has no wheezes. She has no rales.  GI: Soft. Bowel sounds are normal.  Musculoskeletal: She exhibits no edema.  Neurological:  Left-sided hemiplegia   Skin: Skin is warm and dry. She is not diaphoretic.  Psychiatric: She has a normal mood and affect. Her behavior is normal.    Assessment/Plan: Principal Problem:   Sepsis associated hypotension Active Problems:   Seizure   Hypercholesteremia   GERD (gastroesophageal reflux disease)   UTI (lower urinary tract infection)   ARF (acute renal failure)   Fall   Altered mental status   Severe sepsis   CHF, acute - mild  Plan: most recent echo demonstrates reduced systolic function and new WMAs compared to prior echo, performed 2 days earlier. CXR was suggestive of mild CHF vs PNA. BNP was elevated at > 15K.  She is on IV antibiotics and was given IV Lasix. She reports improvement in breathing. Consider continuing with IV Lasix. Will also need to replete K+. Continue to follow BNP. MD to follow with further recommendation.   Allayne Butcher, PA-C 02/25/2013, 4:03 PM    Patient seen and examined. Agree with assessment and plan. Very pleasant 52 AAF who is s/p remote intracranial hemorrhage with resultant left hemiplegia. An initial echo done on 6/22 showed apparently nl lv fxn without LVH. She was treated with 8 - 9 liters fluid for hypotension and developed CHF with BNP 15214. Marland Kitchen Repeat echo showed diffuse hypokinesis with EF 40 - 45% and gr 2 diastolic dysfunction. Suspect this was contributed by volume overload leading to reduced LV compliance and global dysfunction. She has improved with IV lasix.  Will need to K replete and  check Mg. If renal function stabilizes and BP remains normal will  benefit from re-initiation of ARB or low dose ACE_I.   Lennette Bihari, MD, [facility] 02/25/2013 4:29 PM

## 2013-02-25 NOTE — Progress Notes (Signed)
Patient evaluated for community based chronic disease management services with [facility] Care Management Program as a benefit of patient's [facility] Medicare Insurance.  Spoke with patient at bedside to explain [facility] Care Management services. Patient feels she can self manage at this time but would like to discuss her pain management with her unit CM.  Relayed message to [facility].  Left contact information and [facility] literature at bedside. Made inpatient Case Manager aware that [facility] Care Management consult. Of note, [facility] Care Management services does not replace or interfere with any services that are arranged by inpatient case management or social work.  For additional questions or referrals please contact Anibal Henderson BSN RN [facility] Liaison at [phone].

## 2013-02-26 ENCOUNTER — Inpatient Hospital Stay (HOSPITAL_COMMUNITY): Payer: Medicare Other

## 2013-02-26 DIAGNOSIS — R0902 Hypoxemia: Secondary | ICD-10-CM

## 2013-02-26 DIAGNOSIS — I519 Heart disease, unspecified: Secondary | ICD-10-CM

## 2013-02-26 LAB — CBC
HCT: 27.5 % — ABNORMAL LOW (ref 36.0–46.0)
Hemoglobin: 8.8 g/dL — ABNORMAL LOW (ref 12.0–15.0)
MCHC: 32 g/dL (ref 30.0–36.0)
RDW: 15.6 % — ABNORMAL HIGH (ref 11.5–15.5)
WBC: 15 10*3/uL — ABNORMAL HIGH (ref 4.0–10.5)

## 2013-02-26 LAB — BASIC METABOLIC PANEL
BUN: 17 mg/dL (ref 6–23)
Chloride: 110 mEq/L (ref 96–112)
GFR calc Af Amer: 58 mL/min — ABNORMAL LOW (ref >90)
GFR calc non Af Amer: 50 mL/min — ABNORMAL LOW (ref >90)
Glucose, Bld: 89 mg/dL (ref 70–99)
Potassium: 3.2 mEq/L — ABNORMAL LOW (ref 3.5–5.1)
Sodium: 143 mEq/L (ref 135–145)

## 2013-02-26 LAB — PRO B NATRIURETIC PEPTIDE: Pro B Natriuretic peptide (BNP): 27114 pg/mL — ABNORMAL HIGH (ref 0–125)

## 2013-02-26 MED ORDER — IRBESARTAN 75 MG PO TABS
75.0000 mg | ORAL_TABLET | Freq: Every day | ORAL | Status: DC
  Administered 2013-02-26 – 2013-03-03 (×6): 75 mg via ORAL
  Filled 2013-02-26 (×7): qty 1

## 2013-02-26 MED ORDER — AMOXICILLIN-POT CLAVULANATE 875-125 MG PO TABS
1.0000 | ORAL_TABLET | Freq: Two times a day (BID) | ORAL | Status: DC
  Administered 2013-02-26 – 2013-03-03 (×11): 1 via ORAL
  Filled 2013-02-26 (×12): qty 1

## 2013-02-26 MED ORDER — PREDNISONE 20 MG PO TABS
30.0000 mg | ORAL_TABLET | Freq: Every day | ORAL | Status: DC
  Administered 2013-02-27 – 2013-03-03 (×5): 30 mg via ORAL
  Filled 2013-02-26 (×6): qty 1

## 2013-02-26 MED ORDER — POTASSIUM CHLORIDE CRYS ER 20 MEQ PO TBCR
40.0000 meq | EXTENDED_RELEASE_TABLET | Freq: Once | ORAL | Status: AC
  Administered 2013-02-26: 40 meq via ORAL
  Filled 2013-02-26: qty 2

## 2013-02-26 NOTE — Progress Notes (Signed)
Physical Therapy Treatment Patient Details Name: Jordan Rivas MRN: [id number] DOB: 12-05-1960 Today's Date: 02/26/2013 Time: 2130-8657 PT Time Calculation (min): 20 min  PT Assessment / Plan / Recommendation  PT Comments   Pt improving in activity tolerance today.  O2 sats remained > 90% with ambulation on RA. Pt still needs assist with application of brace and will benefit from continued PT at [facility] to help her regain functional independence in her own environment  Follow Up Recommendations  [facility]     Does the patient have the potential to tolerate intense rehabilitation     Barriers to Discharge        Equipment Recommendations  Other (comment) (consider 3in1, tub bench)    Recommendations for Other Services OT consult  Frequency Min 3X/week   Progress towards PT Goals Progress towards PT goals: Progressing toward goals  Plan Current plan remains appropriate    Precautions / Restrictions     Pertinent Vitals/Pain No c/o pain. O2 sats > 90% on RA with activity    Mobility  Bed Mobility Bed Mobility: Rolling Right;Rolling Left;Right Sidelying to Sit;Sitting - Scoot to Edge of Bed;Sit to Supine Rolling Right: 5: Supervision;With rail Rolling Left: 5: Supervision;With rail Right Sidelying to Sit: With rails;5: Supervision Sitting - Scoot to Edge of Bed: 5: Supervision Details for Bed Mobility Assistance: pt moving easier today Transfers Transfers: Sit to Stand;Stand to Sit Sit to Stand: With upper extremity assist;From bed;5: Supervision;From chair/3-in-1 Stand to Sit: To bed;5: Supervision;With upper extremity assist;To chair/3-in-1 Stand Pivot Transfers: Other (comment) Details for Transfer Assistance: pt needs extra time for all mobility.  Needed assist to put AFO and shoe on foot thougth pt insisted she was able to do it Ambulation/Gait Ambulation/Gait Assistance: 5: Supervision Ambulation Distance (Feet): 125 Feet Assistive device: Straight cane Ambulation/Gait  Assistance Details: Pt better able to tolerate ambulation today.  Gait Pattern: Step-to pattern;Step-through pattern;Decreased step length - left;Decreased stance time - left;Decreased stride length;Decreased hip/knee flexion - left;Decreased dorsiflexion - left;Decreased weight shift to left Gait velocity: decreased General Gait Details: Pt continues with hemiplegic gait pattern that is slow, but stable with support from straight cane Stairs: No Wheelchair Mobility Wheelchair Mobility: No    Exercises     PT Diagnosis:    PT Problem List:   PT Treatment Interventions:     PT Goals (current goals can now be found in the care plan section)    Visit Information  Last PT Received On: 02/26/13 Assistance Needed: +1    Subjective Data      Cognition  Cognition Arousal/Alertness: Awake/alert Overall Cognitive Status: Within Functional Limits for tasks assessed    Balance  Balance Balance Assessed: No  End of Session PT - End of Session Equipment Utilized During Treatment: Other (comment) (L AFO with shoe, other shoe also applied) Activity Tolerance: Patient tolerated treatment well Patient left: with call bell/phone within reach;in chair Nurse Communication: Mobility status   GP     Jordan Rivas 02/26/2013, 2:57 PM

## 2013-02-26 NOTE — Clinical Documentation Improvement (Signed)
THIS DOCUMENT IS NOT A PERMANENT PART OF THE MEDICAL RECORD  Please update your documentation with the medical record to reflect your response to this query. If you need help knowing how to do this please call [phone].  02/26/13   Dear [facility],  In a better effort to capture your patient's severity of illness, reflect appropriate length of stay and utilization of resources, a review of the patient medical record has revealed the following indicators the diagnosis of Heart Failure.    Based on your clinical judgment, please clarify and document in a progress note and/or discharge summary the clinical condition associated with the following supporting information:  In responding to this query please exercise your independent judgment.  The fact that a query is asked, does not imply that any particular answer is desired or expected.  Possible Clinical Conditions?    Acute Systolic Congestive Heart Failure Acute Diastolic Congestive Heart Failure Acute Systolic & Diastolic Congestive Heart Failure Acute on Chronic Systolic Congestive Heart Failure Acute on Chronic Diastolic Congestive Heart Failure Acute on Chronic Systolic & Diastolic  Congestive Heart Failure Chronic Systolic Congestive Heart Failure Chronic Diastolic Congestive Heart Failure Chronic Systolic & Diastolic Congestive Heart Failure Other Condition Cannot Clinically Determine  Supporting Information:  As per notes "CHF, acute - mild   (Please specify type)   Reviewed: additional documentation in the medical record   Thank You,  Nevin Bloodgood RN, BSN, CCDS Clinical Documentation Specialist: [phone] Cell - [phone] Health Information Management [place]

## 2013-02-26 NOTE — Progress Notes (Signed)
[facility]  PROGRESS NOTE    PATIENT DETAILS Name: Jordan Rivas Age: 52 y.o. Sex: female Date of Birth: Jun 21, 1961 Admit Date: 02/21/2013 Admitting Physician Ron Parker, MD AVW:UJWJ,[id number], MD  Brief Summary Patient is a 52 year old African American female with a past medical history of intracranial hemorrhage approximately 18 years ago with resultant left-sided hemiplegia, admitted to the step down unit following frequent falls, hypertension and a UTI. Patient required multiple units of IV fluid boluses, and stress dose steroids, along with antibiotics. When she was stabilized she was moved to the regular medical surgical unit. She was doing well, urine cultures are positive for pansensitive Escherichia coli. However on 6/23 she developed slight shortness of breath and wheezing, x-ray of the chest done on 6/23 was also by pneumonia. Her BNP was. It is thought that this probably a combination of  pneumonia and possible CHF decompensation. She is currently on Zosyn (previously on Rocephin), awaiting a swallow evaluation and 2-D echocardiogram. Her shortness of breath has been resolved with nebulizer treatments and one dose of IV 40 mg furosemide. She is being tapered off her hydrocortisone that was started for relative adrenal insufficiency in the setting of hypotension.  Subjective: Breathing well  Assessment/Plan:    Sepsis associated hypotension -patient was admitted to the SDU and given fluid boluses, however was still hypotensive, cortisol level was checked-it was 9-given stress dose hydrocortisone with good response. -much improved after starting hydrocortisone-BP now back to normal- will transition of IV to PO steroids- slow taper outpatient - Urine culture shows pansensitive Escherichia coli- previously on Rocephin,  transitioned to Zosyn to cover for pneumonia- will change to augmentin to complete course outpatient - Blood cultures 6/21-negative so  far  leukoctosis -steroid related - no fevers  UTI (lower urinary tract infection)/right-sided pyelonephritis -causing above - abx   HCAP/aspiration pneumonia - Chest x-ray on admission did not show pneumonia, however on 6/23 a repeat x-ray on the patient with short of breath does show pneumonia. - Given her history of a prior CVA and resultant hemiplegia and mild dysarthria, there is some suspicion for aspiration pneumonia, well go ahead and continue with IV Zosyn. Check a swallow eval today. She is clinically better, home in the next 1-2 days on oral Augmentin.  ? Mild Decompensated CHF - BNP significantly elevated on 6/23, mild congestive findings on chest x-ray as well. She did receive his 8-9 L of IV fluid boluses on admission. - For short of breath on 6/23, this was resolved with nebulized bronchodilators and IV furosemide 40 mg x1 dose. - appreciate cardiology seeing: echo done 6/22 shows no wall abnormalities and EF 55% and echo done 6/24 shows diffuse hypokinesis and EF 40-45%  Relative Adrenal Insufficiency -cortisol inappropriately low normal in the setting of hypotension -taper off Hydrocortisone slowly- decrease to 25 mg 3 times a day 6/24 - wean to PO  ARF (acute renal failure)  - patient had significant renal failure with a peak creatinine of 2.28 on admission. Renal failure resolved with hydration. This is likely prerenal in the setting of severe sepsis - Mild bump in creatinine today, following IV lasix yesterday. No further diureses today, currently saline lock. Monitor electrolytes periodically.  -renal ultrasound-neg for hydronephrosis  Altered mental status  -resolved - Likely secondary to toxic metabolite encephalopathy from narcotics and renal failure. CT of the head is negative for acute abnormalities.   Acute urinary retention  - Removed Foley 6/23-per RN pt voiding    Fall  -  Likely secondary to hypotension  - CT of the head negative for acute  abnormalities -PT recs of home health  Diarrhea - C. difficile PCR negative -Start probiotics and Imodium prn-better today  HTN -controlled but BP slowly creeping up, resume verapamil and Benicar when able  Seizure  - Continue Keppra   . GERD (gastroesophageal reflux disease)  - Continue with PPI   Hypercholesteremia -resume statins  Chronic pain syndrome - When necessary oxycodone  -resume Lyrica, Cymbalta and Elavil  Hypokalemia -replace  DVT Prophylaxis: Prophylactic  Heparin   Code Status: Full code   Family Communication patient  Procedures:  None  CONSULTS:  pulmonary/intensive care   MEDICATIONS: Scheduled Meds: . amoxicillin-clavulanate  1 tablet Oral Q12H  . atorvastatin  20 mg Oral QPM  . DULoxetine  60 mg Oral Daily  . feeding supplement  1 Container Oral TID BM  . heparin  5,000 Units Subcutaneous Q8H  . levETIRAcetam  500 mg Oral BID  . oxybutynin  10 mg Oral BID  . pantoprazole  40 mg Oral BID  . [START ON 02/27/2013] predniSONE  30 mg Oral Q breakfast  . pregabalin  75 mg Oral BID  . saccharomyces boulardii  250 mg Oral BID  . sodium chloride  3 mL Intravenous Q12H   Continuous Infusions:   PRN Meds:.acetaminophen, acetaminophen, albuterol, loperamide, ondansetron (ZOFRAN) IV, ondansetron, oxyCODONE  Antibiotics: Anti-infectives   Start     Dose/Rate Route Frequency Ordered Stop   02/26/13 1100  amoxicillin-clavulanate (AUGMENTIN) 875-125 MG per tablet 1 tablet     1 tablet Oral Every 12 hours 02/26/13 1034     02/23/13 1800  piperacillin-tazobactam (ZOSYN) IVPB 3.375 g  Status:  Discontinued     3.375 g 12.5 mL/hr over 240 Minutes Intravenous Every 8 hours 02/23/13 1640 02/26/13 1031   02/21/13 1000  cefTRIAXone (ROCEPHIN) 1 g in dextrose 5 % 50 mL IVPB  Status:  Discontinued     1 g 100 mL/hr over 30 Minutes Intravenous Every 24 hours 02/21/13 0914 02/23/13 1636   02/21/13 0500  cefTRIAXone (ROCEPHIN) 1 g in dextrose 5 % 50 mL  IVPB     1 g 100 mL/hr over 30 Minutes Intravenous  Once 02/21/13 0446 02/21/13 0607       PHYSICAL EXAM: Vital signs in last 24 hours: Filed Vitals:   02/25/13 1739 02/25/13 2034 02/26/13 0426 02/26/13 0924  BP: 150/72 150/83 144/83 138/79  Pulse: 78 81 81 82  Temp: 97.8 F (36.6 C) 97.8 F (36.6 C) 98.5 F (36.9 C) 97.8 F (36.6 C)  TempSrc: Oral Oral Oral Oral  Resp: 20 20 20 19   Height:  5\' 1"  (1.549 m)    Weight:  83.201 kg (183 lb 6.8 oz)    SpO2: 96% 98% 100% 99%    Weight change: 0.501 kg (1 lb 1.7 oz) Filed Weights   02/24/13 2135 02/25/13 0500 02/25/13 2034  Weight: 82.7 kg (182 lb 5.1 oz) 83.2 kg (183 lb 6.8 oz) 83.201 kg (183 lb 6.8 oz)   Body mass index is 34.68 kg/(m^2).   Gen Exam: Awake and alert with clear speech.   Neck: Supple, No JVD.   Chest: B/L Clear.   CVS: S1 S2 Regular, no murmurs.  Abdomen: soft, BS +, non tender, non distended.  Extremities: no edema, lower extremities warm to touch. Neurologic: Chronic left hemiplegia-upper ext fingers with flexion contractures   Skin: No Rash.   Wounds: N/A.   Intake/Output from previous day:  Intake/Output Summary (Last 24 hours) at 02/26/13 1058 Last data filed at 02/26/13 0900  Gross per 24 hour  Intake    960 ml  Output    400 ml  Net    560 ml     LAB RESULTS: CBC  Recent Labs Lab 02/21/13 0139 02/22/13 0430 02/25/13 0940 02/26/13 0840  WBC 11.8* 8.2 11.9* 15.0*  HGB 9.2* 8.3* 8.6* 8.8*  HCT 29.0* 26.1* 26.4* 27.5*  PLT 317 280 284 295  MCV 78.6 79.8 76.1* 77.2*  MCH 24.9* 25.4* 24.8* 24.7*  MCHC 31.7 31.8 32.6 32.0  RDW 15.4 15.5 15.5 15.6*  LYMPHSABS 2.4  --   --   --   MONOABS 0.9  --   --   --   EOSABS 0.3  --   --   --   BASOSABS 0.0  --   --   --     Chemistries   Recent Labs Lab 02/22/13 0430 02/23/13 1359 02/24/13 0556 02/25/13 0940 02/26/13 0840  NA 142 142 144 143 143  K 4.1 3.4* 3.3* 3.2* 3.2*  CL 114* 111 110 109 110  CO2 22 23 23 22 23   GLUCOSE  113* 165* 104* 144* 89  BUN 14 20 21 19 17   CREATININE 0.82 1.02 1.20* 1.31* 1.21*  CALCIUM 8.1* 8.5 8.7 8.5 9.0    CBG:  Recent Labs Lab 02/21/13 0231  GLUCAP 93    GFR Estimated Creatinine Clearance: 52 ml/min (by C-G formula based on Cr of 1.21).  Coagulation profile No results found for this basename: INR, PROTIME,  in the last 168 hours  Cardiac Enzymes  Recent Labs Lab 02/25/13 0940  TROPONINI 0.33*    No components found with this basename: POCBNP,  No results found for this basename: DDIMER,  in the last 72 hours No results found for this basename: HGBA1C,  in the last 72 hours No results found for this basename: CHOL, HDL, LDLCALC, TRIG, CHOLHDL, LDLDIRECT,  in the last 72 hours No results found for this basename: TSH, T4TOTAL, FREET3, T3FREE, THYROIDAB,  in the last 72 hours No results found for this basename: VITAMINB12, FOLATE, FERRITIN, TIBC, IRON, RETICCTPCT,  in the last 72 hours No results found for this basename: LIPASE, AMYLASE,  in the last 72 hours  Urine Studies No results found for this basename: UACOL, UAPR, USPG, UPH, UTP, UGL, UKET, UBIL, UHGB, UNIT, UROB, ULEU, UEPI, UWBC, URBC, UBAC, CAST, CRYS, UCOM, BILUA,  in the last 72 hours  MICROBIOLOGY: Recent Results (from the past 240 hour(s))  URINE CULTURE     Status: None   Collection Time    02/21/13  3:47 AM      Result Value Range Status   Specimen Description URINE, CATHETERIZED   Final   Special Requests CX ADDED AT 0408 ON [id number]   Final   Culture  Setup Time 02/21/2013 15:24   Final   Colony Count >=100,000 COLONIES/ML   Final   Culture ESCHERICHIA COLI   Final   Report Status 02/23/2013 FINAL   Final   Organism ID, Bacteria ESCHERICHIA COLI   Final  CULTURE, BLOOD (ROUTINE X 2)     Status: None   Collection Time    02/21/13  6:53 AM      Result Value Range Status   Specimen Description BLOOD RIGHT HAND   Final   Special Requests BOTTLES DRAWN AEROBIC AND ANAEROBIC 5CC   Final    Culture  Setup Time 02/21/2013 15:12  Final   Culture     Final   Value:        BLOOD CULTURE RECEIVED NO GROWTH TO DATE CULTURE WILL BE HELD FOR 5 DAYS BEFORE ISSUING A FINAL NEGATIVE REPORT   Report Status PENDING   Incomplete  CULTURE, BLOOD (ROUTINE X 2)     Status: None   Collection Time    02/21/13 10:10 AM      Result Value Range Status   Specimen Description BLOOD LEFT HAND   Final   Special Requests BOTTLES DRAWN AEROBIC ONLY 5CC   Final   Culture  Setup Time 02/21/2013 15:10   Final   Culture     Final   Value:        BLOOD CULTURE RECEIVED NO GROWTH TO DATE CULTURE WILL BE HELD FOR 5 DAYS BEFORE ISSUING A FINAL NEGATIVE REPORT   Report Status PENDING   Incomplete  MRSA PCR SCREENING     Status: None   Collection Time    02/21/13 10:37 AM      Result Value Range Status   MRSA by PCR NEGATIVE  NEGATIVE Final   Comment:            The GeneXpert MRSA Assay (FDA     approved for NASAL specimens     only), is one component of a     comprehensive MRSA colonization     surveillance program. It is not     intended to diagnose MRSA     infection nor to guide or     monitor treatment for     MRSA infections.  CLOSTRIDIUM DIFFICILE BY PCR     Status: None   Collection Time    02/23/13  8:48 AM      Result Value Range Status   C difficile by pcr NEGATIVE  NEGATIVE Final    RADIOLOGY STUDIES/RESULTS: Dg Chest 2 View  02/21/2013   *RADIOLOGY REPORT*  Clinical Data: Altered mental status.  Fall.  CHEST - 2 VIEW  Comparison: 01/28/2012  Findings: The heart, mediastinum and hila are unremarkable.  The lungs are clear.  No pleural effusion or pneumothorax.  The bony thorax is demineralized but intact.  IMPRESSION: No acute cardiopulmonary disease.   Original Report Authenticated By: Amie Portland, M.D.   Ct Head Wo Contrast  02/21/2013   *RADIOLOGY REPORT*  Clinical Data: Fall, left side paralysis from prior stroke, history hypertension, seizure  CT HEAD WITHOUT CONTRAST  Technique:   Contiguous axial images were obtained from the base of the skull through the vertex without contrast.  Comparison: 01/28/2012  Findings: Generalized atrophy. Ex vacuo dilatation of the right lateral ventricle again identified secondary to large area of right MCA infarction involving the right parietal lobe and basal ganglia. No midline shift or mass effect. Small vessel chronic ischemic changes of deep cerebral white matter. No intracranial hemorrhage or evidence of acute infarction. No extra-axial fluid collections. Postsurgical changes of right frontoparietal craniotomy. Hyperdense nodule para falcine at the posterior right parietal region 11 x 9 mm consistent with meningioma, unchanged. Dense calcification within anterior falx. No new intracranial mass lesions.  IMPRESSION: Large old right MCA infarct. Mild small vessel chronic ischemic changes deep cerebral white matter. Stable right parafalcine meningioma posteriorly. Postsurgical changes right frontoparietal craniotomy. No new intracranial abnormalities.   Original Report Authenticated By: Ulyses Southward, M.D.   US Renal  02/21/2013   *RADIOLOGY REPORT*  Clinical Data: Acute renal failure  RENAL/URINARY TRACT ULTRASOUND COMPLETE  Comparison:  CT, 11/03/2010  Findings:  Right Kidney:  Right kidney is normal in overall size and echogenicity.  There is mild renal cortical thinning.  No renal masses or stones.  No hydronephrosis.  The right kidney measures 9.9 cm.  Left Kidney:  Left kidney is normal in overall size echogenicity. No left renal masses or stones or hydronephrosis.  Left kidney measures 10.2 cm.  Bladder:  Unremarkable  IMPRESSION: Mild right renal cortical thinning.  Exam is otherwise unremarkable.  No hydronephrosis.   Original Report Authenticated By: Amie Portland, M.D.    Benjamine Mola, Shanda Bumps,    [phone]  If 7PM-7AM, please contact night-coverage [email or web address] Password [facility] 02/26/2013, 10:58 AM   LOS: 5 days

## 2013-02-26 NOTE — Progress Notes (Addendum)
Subjective: Breathing better today.  Objective: Vital signs in last 24 hours: Temp:  [97.8 F (36.6 C)-98.5 F (36.9 C)] 97.8 F (36.6 C) (06/26 0924) Pulse Rate:  [74-82] 82 (06/26 0924) Resp:  [19-20] 19 (06/26 0924) BP: (128-150)/(72-83) 138/79 mmHg (06/26 0924) SpO2:  [96 %-100 %] 99 % (06/26 0924) Weight:  [183 lb 6.8 oz (83.201 kg)] 183 lb 6.8 oz (83.201 kg) (06/25 2034) Last BM Date: 02/25/13  Intake/Output from previous day: 06/25 0701 - 06/26 0700 In: 1090 [P.O.:1040; IV Piggyback:50] Out: 400 [Urine:400] Intake/Output this shift: Total I/O In: 240 [P.O.:240] Out: -   Medications Current Facility-Administered Medications  Medication Dose Route Frequency Provider Last Rate Last Dose  . acetaminophen (TYLENOL) tablet 650 mg  650 mg Oral Q6H PRN Maretta Bees, MD   650 mg at 02/21/13 1106   Or  . acetaminophen (TYLENOL) suppository 650 mg  650 mg Rectal Q6H PRN Maretta Bees, MD      . albuterol (PROVENTIL) (5 MG/ML) 0.5% nebulizer solution 2.5 mg  2.5 mg Nebulization Q2H PRN Maretta Bees, MD   2.5 mg at 02/25/13 0002  . atorvastatin (LIPITOR) tablet 20 mg  20 mg Oral QPM Maretta Bees, MD   20 mg at 02/25/13 1734  . DULoxetine (CYMBALTA) DR capsule 60 mg  60 mg Oral Daily Maretta Bees, MD   60 mg at 02/25/13 0911  . feeding supplement (ENSURE) pudding 1 Container  1 Container Oral TID BM Maretta Bees, MD   1 Container at 02/24/13 2123  . heparin injection 5,000 Units  5,000 Units Subcutaneous Q8H Maretta Bees, MD   5,000 Units at 02/26/13 [phone]  . levETIRAcetam (KEPPRA) tablet 500 mg  500 mg Oral BID Maretta Bees, MD   500 mg at 02/25/13 2221  . loperamide (IMODIUM) capsule 2 mg  2 mg Oral PRN Maretta Bees, MD   2 mg at 02/25/13 2024  . ondansetron (ZOFRAN) tablet 4 mg  4 mg Oral Q6H PRN Maretta Bees, MD   4 mg at 02/24/13 1034   Or  . ondansetron [facility]) injection 4 mg  4 mg Intravenous Q6H PRN Maretta Bees, MD    4 mg at 02/22/13 2045  . oxybutynin (DITROPAN-XL) 24 hr tablet 10 mg  10 mg Oral BID Maretta Bees, MD   10 mg at 02/25/13 2220  . oxyCODONE (Oxy IR/ROXICODONE) immediate release tablet 5 mg  5 mg Oral Q6H PRN Maretta Bees, MD   5 mg at 02/26/13 0929  . pantoprazole (PROTONIX) EC tablet 40 mg  40 mg Oral BID Maretta Bees, MD   40 mg at 02/26/13 0840  . piperacillin-tazobactam (ZOSYN) IVPB 3.375 g  3.375 g Intravenous Q8H Maretta Bees, MD   3.375 g at 02/26/13 0154  . potassium chloride SA (K-DUR,KLOR-CON) CR tablet 40 mEq  40 mEq Oral Once Joseph Art, DO      . [START ON 02/27/2013] predniSONE (DELTASONE) tablet 30 mg  30 mg Oral Q breakfast Jessica U Vann, DO      . pregabalin (LYRICA) capsule 75 mg  75 mg Oral BID Maretta Bees, MD   75 mg at 02/26/13 0840  . saccharomyces boulardii (FLORASTOR) capsule 250 mg  250 mg Oral BID Maretta Bees, MD   250 mg at 02/25/13 2220  . sodium chloride 0.9 % injection 3 mL  3 mL Intravenous Q12H Shanker Levora Dredge, MD  3 mL at 02/24/13 2132    PE: General appearance: alert, cooperative and no distress Lungs: Diffuse bilateral crackles.  No Wheeze. Heart: regular rate and rhythm, S1, S2 normal, no murmur, click, rub or gallop Abdomen: +BS, soft nontender Extremities: No LEE Pulses: 2+ and symmetric Neurologic: Left hemiplegia   Lab Results:   Recent Labs  02/25/13 0940 02/26/13 0840  WBC 11.9* 15.0*  HGB 8.6* 8.8*  HCT 26.4* 27.5*  PLT 284 295   BMET  Recent Labs  02/24/13 0556 02/25/13 0940 02/26/13 0840  NA 144 143 143  K 3.3* 3.2* 3.2*  CL 110 109 110  CO2 23 22 23   GLUCOSE 104* 144* 89  BUN 21 19 17   CREATININE 1.20* 1.31* 1.21*  CALCIUM 8.7 8.5 9.0    Assessment/Plan  Principal Problem:   Sepsis associated hypotension Active Problems:   Seizure   Hypercholesteremia   GERD (gastroesophageal reflux disease)   UTI (lower urinary tract infection)   ARF (acute renal failure)   Fall    Altered mental status   Severe sepsis   CHF, acute - mild  Plan:  Breathing better today.  No LEE.  SP IV lasix 40mg  x1.  SCr stable.   Net fluids:  +0.69L/+3.4L ?accuracy.  UOP .   Need strict I&O's and daily weights.   Will recheck BNP today.  Potassium given this AM.  Recheck CXR today.  May need another lasix dose but does not appear in overt CHF.   LOS: 5 days    HAGER, BRYAN 02/26/2013 10:10 AM  I have seen and evaluated the patient this AM along with Wilburt Finlay, PA. I agree with his findings, examination as well as impression recommendations.  Feels better today - I/O not recorded, multiple urination episodes noted, but not codified. Wgt change noted  184 up to 187 - now 182.  Would expect that she is close to adequately diuresed.  Renal Fxn stable. K 3/2 -- pre KDur.   Agree with recheck CXR - latest study (6/23) suggested L>R lower Lobar PNA.  Would expect BNP drop.  I suspect severe sepsis - hypotension (shock)  With aggressive hydration & anemia may have led to transient myocardial ischemia (hypo-emia) leading to increased wall stress & diastolic failure -- would expect EF to return to normal.   - with aggressive fluid resuscitation & 3rd spacing, we need to watch for de-marginalization of 3rd space fluid volume into the intravascluar volume.    Recheck BNP & CXR - dose lasix as needed, but would keep standing PO dose of at least 20mg   AS BP now stable, will re-initiate ARB @ 1/2 dose (would not use Verapamil in setting of reduced EF -- instead would use BB - carvedilol 3.125 mg bid - tomorrow)  Once stable, may well need non-invasive cardiac evaluation for ischemia, but can be done as OP -- Cardiolite vs. CPET test.  Would defer to Dr. Tresa Endo on OP f/u.  MD Time with pt: 10 min  HARDING,DAVID W, M.D., M.S. [facility] [address] [place], [place]  [address]  [phone] Pager # [phone] 02/26/2013 12:19 PM

## 2013-02-27 DIAGNOSIS — I214 Non-ST elevation (NSTEMI) myocardial infarction: Secondary | ICD-10-CM | POA: Diagnosis not present

## 2013-02-27 LAB — BASIC METABOLIC PANEL
BUN: 16 mg/dL (ref 6–23)
BUN: 15 mg/dL (ref 6–23)
Calcium: 8.7 mg/dL (ref 8.4–10.5)
Calcium: 8.9 mg/dL (ref 8.4–10.5)
Creatinine, Ser: 1.06 mg/dL (ref 0.50–1.10)
Creatinine, Ser: 1 mg/dL (ref 0.50–1.10)
GFR calc Af Amer: 68 mL/min — ABNORMAL LOW (ref >90)
GFR calc non Af Amer: 58 mL/min — ABNORMAL LOW (ref >90)
GFR calc non Af Amer: 63 mL/min — ABNORMAL LOW (ref >90)
Glucose, Bld: 112 mg/dL — ABNORMAL HIGH (ref 70–99)
Potassium: 2.9 mEq/L — ABNORMAL LOW (ref 3.5–5.1)
Sodium: 142 mEq/L (ref 135–145)

## 2013-02-27 LAB — CULTURE, BLOOD (ROUTINE X 2): Culture: NO GROWTH

## 2013-02-27 LAB — CBC
HCT: 25.8 % — ABNORMAL LOW (ref 36.0–46.0)
MCH: 25.3 pg — ABNORMAL LOW (ref 26.0–34.0)
MCHC: 32.6 g/dL (ref 30.0–36.0)
RDW: 15.9 % — ABNORMAL HIGH (ref 11.5–15.5)

## 2013-02-27 LAB — MAGNESIUM: Magnesium: 1.8 mg/dL (ref 1.5–2.5)

## 2013-02-27 MED ORDER — ASPIRIN 81 MG PO CHEW
81.0000 mg | CHEWABLE_TABLET | Freq: Once | ORAL | Status: AC
  Administered 2013-02-27: 81 mg via ORAL
  Filled 2013-02-27: qty 1

## 2013-02-27 MED ORDER — POTASSIUM CHLORIDE CRYS ER 20 MEQ PO TBCR
40.0000 meq | EXTENDED_RELEASE_TABLET | ORAL | Status: AC
  Administered 2013-02-27 (×4): 40 meq via ORAL
  Filled 2013-02-27 (×4): qty 2

## 2013-02-27 MED ORDER — AMITRIPTYLINE HCL 75 MG PO TABS
75.0000 mg | ORAL_TABLET | Freq: Every day | ORAL | Status: DC
  Administered 2013-02-27 – 2013-03-02 (×4): 75 mg via ORAL
  Filled 2013-02-27 (×5): qty 1

## 2013-02-27 MED ORDER — MAGNESIUM SULFATE 40 MG/ML IJ SOLN
2.0000 g | Freq: Once | INTRAMUSCULAR | Status: AC
  Administered 2013-02-27: 2 g via INTRAVENOUS
  Filled 2013-02-27: qty 50

## 2013-02-27 MED ORDER — POTASSIUM CHLORIDE CRYS ER 20 MEQ PO TBCR: 40.0000 meq | EXTENDED_RELEASE_TABLET | ORAL | Status: DC

## 2013-02-27 MED ORDER — FUROSEMIDE 20 MG PO TABS
20.0000 mg | ORAL_TABLET | Freq: Every day | ORAL | Status: DC
  Administered 2013-02-27 – 2013-03-03 (×5): 20 mg via ORAL
  Filled 2013-02-27 (×5): qty 1

## 2013-02-27 MED ORDER — METOPROLOL SUCCINATE 12.5 MG HALF TABLET
12.5000 mg | ORAL_TABLET | Freq: Every day | ORAL | Status: DC
  Administered 2013-02-27 – 2013-02-28 (×2): 12.5 mg via ORAL
  Filled 2013-02-27 (×3): qty 1

## 2013-02-27 NOTE — Progress Notes (Addendum)
[facility]  PROGRESS NOTE    PATIENT DETAILS Name: Jordan Rivas Age: 52 y.o. Sex: female Date of Birth: March 25, 1961 Admit Date: 02/21/2013 Admitting Physician Ron Parker, MD AVW:UJWJ,[id number], MD  Brief Summary Patient is a 52 year old African American female with a past medical history of intracranial hemorrhage approximately 18 years ago with resultant left-sided hemiplegia, admitted to the step down unit following frequent falls, hypertension and a UTI. Patient required multiple units of IV fluid boluses, and stress dose steroids, along with antibiotics. When she was stabilized she was moved to the regular medical surgical unit. She was doing well, urine cultures are positive for pansensitive Escherichia coli. However on 6/23 she developed slight shortness of breath and wheezing, x-ray of the chest done on 6/23 was also by pneumonia. Her BNP was. It is thought that this probably a combination of  pneumonia and possible CHF decompensation.  She is being tapered off her hydrocortisone that was started for relative adrenal insufficiency in the setting of hypotension.  Subjective: Breathing well  Assessment/Plan:    Sepsis associated hypotension -patient was admitted to the SDU and given fluid boluses, however was still hypotensive, cortisol level was checked-it was 9-given stress dose hydrocortisone with good response. -much improved after starting hydrocortisone-BP now back to normal- transition off of IV to PO steroids- slow taper outpatient - Urine culture shows pansensitive Escherichia coli- previously on Rocephin,  transitioned to Zosyn to cover for pneumonia- will change to augmentin to complete course outpatient - Blood cultures 6/21-negative  leukoctosis -steroid related - no fevers  UTI (lower urinary tract infection)/right-sided pyelonephritis -causing above - abx   HCAP/aspiration pneumonia - Chest x-ray on admission did not show pneumonia, however on  6/23 a repeat x-ray on the patient with short of breath does show pneumonia. - Given her history of a prior CVA and resultant hemiplegia and mild dysarthria, there is some suspicion for aspiration pneumonia, well go ahead and continue with IV Zosyn. She is clinically better, home in the next 1-2 days on oral Augmentin.  acute Decompensated systolic CHF - BNP significantly elevated on 6/23, mild congestive findings on chest x-ray as well. She did receive his 8-9 L of IV fluid boluses on admission.. - appreciate cardiology seeing: echo done 6/22 shows no wall abnormalities and EF 55% and echo done 6/24 shows diffuse hypokinesis and EF 40-45% - added BB, on ACE, ASA -following BNP- increasing -lasix PO  NSTEMI ASA Hypokalemia- -replace with Mg  Relative Adrenal Insufficiency -cortisol inappropriately low normal in the setting of hypotension -taper   ARF (acute renal failure)  - patient had significant renal failure with a peak creatinine of 2.28 on admission. Renal failure resolved with hydration. This is likely prerenal in the setting of severe sepsis -renal ultrasound-neg for hydronephrosis  Altered mental status  -resolved - Likely secondary to toxic metabolite encephalopathy from narcotics and renal failure. CT of the head is negative for acute abnormalities.   Acute urinary retention  - Removed Foley 6/23-per RN pt voiding    Fall  - Likely secondary to hypotension  - CT of the head negative for acute abnormalities -PT recs of home health  Diarrhea - C. difficile PCR negative -Start probiotics and Imodium prn-better today  HTN -controlled but BP slowly creeping up, resume verapamil and Benicar when able  Seizure  - Continue Keppra   . GERD (gastroesophageal reflux disease)  - Continue with PPI   Hypercholesteremia -resume statins  Chronic pain syndrome - When necessary oxycodone  -  resume Lyrica, Cymbalta and Elavil    DVT Prophylaxis: Prophylactic  Heparin    Code Status: Full code   Family Communication patient  Procedures:  None  CONSULTS:  pulmonary/intensive care   MEDICATIONS: Scheduled Meds: . amitriptyline  75 mg Oral QHS  . amoxicillin-clavulanate  1 tablet Oral Q12H  . aspirin  81 mg Oral Once  . atorvastatin  20 mg Oral QPM  . DULoxetine  60 mg Oral Daily  . feeding supplement  1 Container Oral TID BM  . furosemide  20 mg Oral Daily  . heparin  5,000 Units Subcutaneous Q8H  . irbesartan  75 mg Oral Daily  . levETIRAcetam  500 mg Oral BID  . magnesium sulfate 1 - 4 g bolus IVPB  2 g Intravenous Once  . metoprolol succinate  12.5 mg Oral Daily  . oxybutynin  10 mg Oral BID  . pantoprazole  40 mg Oral BID  . potassium chloride  40 mEq Oral Q4H  . predniSONE  30 mg Oral Q breakfast  . pregabalin  75 mg Oral BID  . saccharomyces boulardii  250 mg Oral BID  . sodium chloride  3 mL Intravenous Q12H   Continuous Infusions:   PRN Meds:.acetaminophen, acetaminophen, albuterol, loperamide, ondansetron (ZOFRAN) IV, ondansetron, oxyCODONE  Antibiotics: Anti-infectives   Start     Dose/Rate Route Frequency Ordered Stop   02/26/13 1100  amoxicillin-clavulanate (AUGMENTIN) 875-125 MG per tablet 1 tablet     1 tablet Oral Every 12 hours 02/26/13 1034     02/23/13 1800  piperacillin-tazobactam (ZOSYN) IVPB 3.375 g  Status:  Discontinued     3.375 g 12.5 mL/hr over 240 Minutes Intravenous Every 8 hours 02/23/13 1640 02/26/13 1031   02/21/13 1000  cefTRIAXone (ROCEPHIN) 1 g in dextrose 5 % 50 mL IVPB  Status:  Discontinued     1 g 100 mL/hr over 30 Minutes Intravenous Every 24 hours 02/21/13 0914 02/23/13 1636   02/21/13 0500  cefTRIAXone (ROCEPHIN) 1 g in dextrose 5 % 50 mL IVPB     1 g 100 mL/hr over 30 Minutes Intravenous  Once 02/21/13 0446 02/21/13 0607       PHYSICAL EXAM: Vital signs in last 24 hours: Filed Vitals:   02/26/13 1719 02/26/13 2057 02/27/13 0424 02/27/13 0953  BP: 145/83 138/70 139/74 142/83   Pulse: 95 84 94 95  Temp: 97.6 F (36.4 C) 98.3 F (36.8 C) 97.5 F (36.4 C) 98.4 F (36.9 C)  TempSrc: Oral Oral Oral Oral  Resp: 18 18 18 18   Height:  5\' 1"  (1.549 m)    Weight:  83.201 kg (183 lb 6.8 oz)    SpO2: 98% 99% 98% 82%    Weight change: 0 kg (0 lb) Filed Weights   02/25/13 0500 02/25/13 2034 02/26/13 2057  Weight: 83.2 kg (183 lb 6.8 oz) 83.201 kg (183 lb 6.8 oz) 83.201 kg (183 lb 6.8 oz)   Body mass index is 34.68 kg/(m^2).   Gen Exam: Awake and alert with clear speech.   Neck: Supple, No JVD.   Chest: B/L Clear.   CVS: S1 S2 Regular, no murmurs.  Abdomen: soft, BS +, non tender, non distended.  Extremities: no edema, lower extremities warm to touch. Neurologic: Chronic left hemiplegia-upper ext fingers with flexion contractures   Skin: No Rash.   Wounds: N/A.   Intake/Output from previous day:  Intake/Output Summary (Last 24 hours) at 02/27/13 1002 Last data filed at 02/27/13 0900  Gross per 24  hour  Intake   1080 ml  Output    400 ml  Net    680 ml     LAB RESULTS: CBC  Recent Labs Lab 02/21/13 0139 02/22/13 0430 02/25/13 0940 02/26/13 0840 02/27/13 0815  WBC 11.8* 8.2 11.9* 15.0* 14.2*  HGB 9.2* 8.3* 8.6* 8.8* 8.4*  HCT 29.0* 26.1* 26.4* 27.5* 25.8*  PLT 317 280 284 295 324  MCV 78.6 79.8 76.1* 77.2* 77.7*  MCH 24.9* 25.4* 24.8* 24.7* 25.3*  MCHC 31.7 31.8 32.6 32.0 32.6  RDW 15.4 15.5 15.5 15.6* 15.9*  LYMPHSABS 2.4  --   --   --   --   MONOABS 0.9  --   --   --   --   EOSABS 0.3  --   --   --   --   BASOSABS 0.0  --   --   --   --     Chemistries   Recent Labs Lab 02/23/13 1359 02/24/13 0556 02/25/13 0940 02/26/13 0840 02/27/13 0815  NA 142 144 143 143 140  K 3.4* 3.3* 3.2* 3.2* 2.9*  CL 111 110 109 110 108  CO2 23 23 22 23 23   GLUCOSE 165* 104* 144* 89 95  BUN 20 21 19 17 16   CREATININE 1.02 1.20* 1.31* 1.21* 1.06  CALCIUM 8.5 8.7 8.5 9.0 8.7  MG  --   --   --   --  1.8    CBG:  Recent Labs Lab  02/21/13 0231  GLUCAP 93    GFR Estimated Creatinine Clearance: 59.4 ml/min (by C-G formula based on Cr of 1.06).  Coagulation profile No results found for this basename: INR, PROTIME,  in the last 168 hours  Cardiac Enzymes  Recent Labs Lab 02/25/13 0940 02/27/13 0815  TROPONINI 0.33* <0.30    No components found with this basename: POCBNP,  No results found for this basename: DDIMER,  in the last 72 hours No results found for this basename: HGBA1C,  in the last 72 hours No results found for this basename: CHOL, HDL, LDLCALC, TRIG, CHOLHDL, LDLDIRECT,  in the last 72 hours No results found for this basename: TSH, T4TOTAL, FREET3, T3FREE, THYROIDAB,  in the last 72 hours No results found for this basename: VITAMINB12, FOLATE, FERRITIN, TIBC, IRON, RETICCTPCT,  in the last 72 hours No results found for this basename: LIPASE, AMYLASE,  in the last 72 hours  Urine Studies No results found for this basename: UACOL, UAPR, USPG, UPH, UTP, UGL, UKET, UBIL, UHGB, UNIT, UROB, ULEU, UEPI, UWBC, URBC, UBAC, CAST, CRYS, UCOM, BILUA,  in the last 72 hours  MICROBIOLOGY: Recent Results (from the past 240 hour(s))  URINE CULTURE     Status: None   Collection Time    02/21/13  3:47 AM      Result Value Range Status   Specimen Description URINE, CATHETERIZED   Final   Special Requests CX ADDED AT 0408 ON [id number]   Final   Culture  Setup Time 02/21/2013 15:24   Final   Colony Count >=100,000 COLONIES/ML   Final   Culture ESCHERICHIA COLI   Final   Report Status 02/23/2013 FINAL   Final   Organism ID, Bacteria ESCHERICHIA COLI   Final  CULTURE, BLOOD (ROUTINE X 2)     Status: None   Collection Time    02/21/13  6:53 AM      Result Value Range Status   Specimen Description BLOOD RIGHT HAND   Final  Special Requests BOTTLES DRAWN AEROBIC AND ANAEROBIC 5CC   Final   Culture  Setup Time 02/21/2013 15:12   Final   Culture NO GROWTH 5 DAYS   Final   Report Status 02/27/2013 FINAL   Final   CULTURE, BLOOD (ROUTINE X 2)     Status: None   Collection Time    02/21/13 10:10 AM      Result Value Range Status   Specimen Description BLOOD LEFT HAND   Final   Special Requests BOTTLES DRAWN AEROBIC ONLY 5CC   Final   Culture  Setup Time 02/21/2013 15:10   Final   Culture NO GROWTH 5 DAYS   Final   Report Status 02/27/2013 FINAL   Final  MRSA PCR SCREENING     Status: None   Collection Time    02/21/13 10:37 AM      Result Value Range Status   MRSA by PCR NEGATIVE  NEGATIVE Final   Comment:            The GeneXpert MRSA Assay (FDA     approved for NASAL specimens     only), is one component of a     comprehensive MRSA colonization     surveillance program. It is not     intended to diagnose MRSA     infection nor to guide or     monitor treatment for     MRSA infections.  CLOSTRIDIUM DIFFICILE BY PCR     Status: None   Collection Time    02/23/13  8:48 AM      Result Value Range Status   C difficile by pcr NEGATIVE  NEGATIVE Final    RADIOLOGY STUDIES/RESULTS: Dg Chest 2 View  02/21/2013   *RADIOLOGY REPORT*  Clinical Data: Altered mental status.  Fall.  CHEST - 2 VIEW  Comparison: 01/28/2012  Findings: The heart, mediastinum and hila are unremarkable.  The lungs are clear.  No pleural effusion or pneumothorax.  The bony thorax is demineralized but intact.  IMPRESSION: No acute cardiopulmonary disease.   Original Report Authenticated By: Amie Portland, M.D.   Ct Head Wo Contrast  02/21/2013   *RADIOLOGY REPORT*  Clinical Data: Fall, left side paralysis from prior stroke, history hypertension, seizure  CT HEAD WITHOUT CONTRAST  Technique:  Contiguous axial images were obtained from the base of the skull through the vertex without contrast.  Comparison: 01/28/2012  Findings: Generalized atrophy. Ex vacuo dilatation of the right lateral ventricle again identified secondary to large area of right MCA infarction involving the right parietal lobe and basal ganglia. No midline  shift or mass effect. Small vessel chronic ischemic changes of deep cerebral white matter. No intracranial hemorrhage or evidence of acute infarction. No extra-axial fluid collections. Postsurgical changes of right frontoparietal craniotomy. Hyperdense nodule para falcine at the posterior right parietal region 11 x 9 mm consistent with meningioma, unchanged. Dense calcification within anterior falx. No new intracranial mass lesions.  IMPRESSION: Large old right MCA infarct. Mild small vessel chronic ischemic changes deep cerebral white matter. Stable right parafalcine meningioma posteriorly. Postsurgical changes right frontoparietal craniotomy. No new intracranial abnormalities.   Original Report Authenticated By: Ulyses Southward, M.D.   US Renal  02/21/2013   *RADIOLOGY REPORT*  Clinical Data: Acute renal failure  RENAL/URINARY TRACT ULTRASOUND COMPLETE  Comparison:  CT, 11/03/2010  Findings:  Right Kidney:  Right kidney is normal in overall size and echogenicity.  There is mild renal cortical thinning.  No renal masses or stones.  No hydronephrosis.  The right kidney measures 9.9 cm.  Left Kidney:  Left kidney is normal in overall size echogenicity. No left renal masses or stones or hydronephrosis.  Left kidney measures 10.2 cm.  Bladder:  Unremarkable  IMPRESSION: Mild right renal cortical thinning.  Exam is otherwise unremarkable.  No hydronephrosis.   Original Report Authenticated By: Amie Portland, M.D.    Benjamine Mola, Shanda Bumps,    [phone]  If 7PM-7AM, please contact night-coverage [email or web address] Password [email or web address] 02/27/2013, 10:02 AM   LOS: 6 days

## 2013-02-27 NOTE — Progress Notes (Signed)
Physical Therapy Treatment Patient Details Name: Jordan Rivas MRN: [id number] DOB: 12/20/1960 Today's Date: 02/27/2013 Time: 4098-1191 PT Time Calculation (min): 24 min  PT Assessment / Plan / Recommendation  PT Comments   Pt needs extra time for activities, but is able to do most of application of pants, shoes, AFO on her own.  Nasal O2 maintained per RN. Pt was able to walk further today with straight cane, but decreased advancement of left leg noted with her fatigue  Follow Up Recommendations  Home health PT;Supervision - Intermittent (if slow progress, must consider SNF)     Does the patient have the potential to tolerate intense rehabilitation     Barriers to Discharge        Equipment Recommendations  Other (comment) (consider 3in1, tub bench)    Recommendations for Other Services OT consult  Frequency Min 3X/week   Progress towards PT Goals Progress towards PT goals: Progressing toward goals  Plan Current plan remains appropriate    Precautions / Restrictions Restrictions Weight Bearing Restrictions: No   Pertinent Vitals/Pain No c/o pain    Mobility  Bed Mobility Bed Mobility: Rolling Right;Rolling Left;Right Sidelying to Sit;Sitting - Scoot to Edge of Bed;Sit to Supine Rolling Right: With rail;6: Modified independent (Device/Increase time) Rolling Left: With rail;6: Modified independent (Device/Increase time) Right Sidelying to Sit: With rails;6: Modified independent (Device/Increase time) Sitting - Scoot to Edge of Bed: 6: Modified independent (Device/Increase time) Details for Bed Mobility Assistance: pt moving easier today Transfers Transfers: Sit to Stand;Stand to Sit Sit to Stand: With upper extremity assist;From bed;From chair/3-in-1;6: Modified independent (Device/Increase time) Stand to Sit: To bed;With upper extremity assist;To chair/3-in-1;6: Modified independent (Device/Increase time) Details for Transfer Assistance: Pt able to put on pants, right shoe  and left brace and shoe with only rare min assist. Ambulation/Gait Ambulation/Gait Assistance: 6: Modified independent (Device/Increase time) Ambulation Distance (Feet): 150 Feet Assistive device: Straight cane Gait Pattern: Step-to pattern;Step-through pattern;Decreased step length - left;Decreased stance time - left;Decreased stride length;Decreased hip/knee flexion - left;Decreased dorsiflexion - left;Decreased weight shift to left Gait velocity: decreased General Gait Details: pt with some diffuculty with left swing throught phase of gait as she fatigues Stairs: No Wheelchair Mobility Wheelchair Mobility: No    Exercises     PT Diagnosis:    PT Problem List:   PT Treatment Interventions:     PT Goals (current goals can now be found in the care plan section) Acute Rehab PT Goals Patient Stated Goal: to return home  Visit Information  Last PT Received On: 02/27/13 Assistance Needed: +1    Subjective Data  Patient Stated Goal: to return home   Cognition  Cognition Arousal/Alertness: Awake/alert Overall Cognitive Status: Within Functional Limits for tasks assessed    Balance     End of Session PT - End of Session Equipment Utilized During Treatment: Other (comment);Oxygen (L AFO with shoe, other shoe also applied) Activity Tolerance: Patient tolerated treatment well Patient left: with call bell/phone within reach;in chair Nurse Communication: Mobility status   GP     Jordan Rivas 02/27/2013, 1:38 PM

## 2013-02-27 NOTE — Progress Notes (Addendum)
DAILY PROGRESS NOTE  Subjective:  No complaints overnight. Says she is urinating. Weight today is 83.9 KG, which is up from 83.2 KG yesterday. Uop recorded was 400 cc.  Objective:  Temp:  [97.5 F (36.4 Rivas)-98.3 F (36.8 Rivas)] 97.5 F (36.4 Rivas) (06/27 0424) Pulse Rate:  [82-96] 94 (06/27 0424) Resp:  [18-19] 18 (06/27 0424) BP: (134-145)/(70-96) 139/74 mmHg (06/27 0424) SpO2:  [96 %-99 %] 98 % (06/27 0424) Weight:  [183 lb 6.8 oz (83.201 kg)] 183 lb 6.8 oz (83.201 kg) (06/26 2057) Weight change: 0 lb (0 kg)  Intake/Output from previous day: 06/26 0701 - 06/27 0700 In: 1200 [P.O.:1200] Out: -   Intake/Output from this shift:    Medications: Current Facility-Administered Medications  Medication Dose Route Frequency Provider Last Rate Last Dose  . acetaminophen (TYLENOL) tablet 650 mg  650 mg Oral Q6H PRN Maretta Bees, MD   650 mg at 02/21/13 1106   Or  . acetaminophen (TYLENOL) suppository 650 mg  650 mg Rectal Q6H PRN Maretta Bees, MD      . albuterol (PROVENTIL) (5 MG/ML) 0.5% nebulizer solution 2.5 mg  2.5 mg Nebulization Q2H PRN Maretta Bees, MD   2.5 mg at 02/25/13 0002  . amoxicillin-clavulanate (AUGMENTIN) 875-125 MG per tablet 1 tablet  1 tablet Oral Q12H Joseph Art, DO   1 tablet at 02/26/13 2208  . atorvastatin (LIPITOR) tablet 20 mg  20 mg Oral QPM Maretta Bees, MD   20 mg at 02/26/13 1658  . DULoxetine (CYMBALTA) DR capsule 60 mg  60 mg Oral Daily Maretta Bees, MD   60 mg at 02/26/13 1042  . feeding supplement (ENSURE) pudding 1 Container  1 Container Oral TID BM Maretta Bees, MD   1 Container at 02/24/13 2123  . heparin injection 5,000 Units  5,000 Units Subcutaneous Q8H Maretta Bees, MD   5,000 Units at 02/27/13 [phone]  . irbesartan (AVAPRO) tablet 75 mg  75 mg Oral Daily Marykay Lex, MD   75 mg at 02/26/13 1809  . levETIRAcetam (KEPPRA) tablet 500 mg  500 mg Oral BID Maretta Bees, MD   500 mg at 02/26/13 2208  .  loperamide (IMODIUM) capsule 2 mg  2 mg Oral PRN Maretta Bees, MD   2 mg at 02/26/13 2210  . ondansetron (ZOFRAN) tablet 4 mg  4 mg Oral Q6H PRN Maretta Bees, MD   4 mg at 02/24/13 1034   Or  . ondansetron [facility]) injection 4 mg  4 mg Intravenous Q6H PRN Maretta Bees, MD   4 mg at 02/22/13 2045  . oxybutynin (DITROPAN-XL) 24 hr tablet 10 mg  10 mg Oral BID Maretta Bees, MD   10 mg at 02/26/13 2208  . oxyCODONE (Oxy IR/ROXICODONE) immediate release tablet 5 mg  5 mg Oral Q6H PRN Maretta Bees, MD   5 mg at 02/27/13 [phone]  . pantoprazole (PROTONIX) EC tablet 40 mg  40 mg Oral BID Maretta Bees, MD   40 mg at 02/26/13 2208  . predniSONE (DELTASONE) tablet 30 mg  30 mg Oral Q breakfast Joseph Art, DO      . pregabalin (LYRICA) capsule 75 mg  75 mg Oral BID Maretta Bees, MD   75 mg at 02/26/13 2208  . saccharomyces boulardii (FLORASTOR) capsule 250 mg  250 mg Oral BID Maretta Bees, MD   250 mg at 02/26/13 2208  . sodium  chloride 0.9 % injection 3 mL  3 mL Intravenous Q12H Maretta Bees, MD   3 mL at 02/26/13 2211    Physical Exam: General appearance: alert and no distress Neck: no adenopathy, no carotid bruit, no JVD, supple, symmetrical, trachea midline and thyroid not enlarged, symmetric, no tenderness/mass/nodules Lungs: diminished breath sounds bilaterally and dullness to percussion bibasilar Heart: regular rate and rhythm, S1, S2 normal, no murmur, click, rub or gallop Abdomen: obese, soft, non-tender Extremities: extremities normal, atraumatic, no cyanosis or edema Pulses: 2+ and symmetric Skin: Skin color, texture, turgor normal. No rashes or lesions Neurologic: Grossly normal  Lab Results: Results for orders placed during the hospital encounter of 02/21/13 (from the past 48 hour(s))  CBC     Status: Abnormal   Collection Time    02/25/13  9:40 AM      Result Value Range   WBC 11.9 (*) 4.0 - 10.5 K/uL   RBC 3.47 (*) 3.87 - 5.11 MIL/uL    Hemoglobin 8.6 (*) 12.0 - 15.0 g/dL   HCT 16.1 (*) 09.6 - 04.5 %   MCV 76.1 (*) 78.0 - 100.0 fL   MCH 24.8 (*) 26.0 - 34.0 pg   MCHC 32.6  30.0 - 36.0 g/dL   RDW 40.9  81.1 - 91.4 %   Platelets 284  150 - 400 K/uL  BASIC METABOLIC PANEL     Status: Abnormal   Collection Time    02/25/13  9:40 AM      Result Value Range   Sodium 143  135 - 145 mEq/L   Potassium 3.2 (*) 3.5 - 5.1 mEq/L   Chloride 109  96 - 112 mEq/L   CO2 22  19 - 32 mEq/L   Glucose, Bld 144 (*) 70 - 99 mg/dL   BUN 19  6 - 23 mg/dL   Creatinine, Ser 7.82 (*) 0.50 - 1.10 mg/dL   Calcium 8.5  8.4 - 95.6 mg/dL   GFR calc non Af Amer 45 (*) >90 mL/min   GFR calc Af Amer 52 (*) >90 mL/min   Comment:            The eGFR has been calculated     using the CKD EPI equation.     This calculation has not been     validated in all clinical     situations.     eGFR's persistently     <90 mL/min signify     possible Chronic Kidney Disease.  TROPONIN I     Status: Abnormal   Collection Time    02/25/13  9:40 AM      Result Value Range   Troponin I 0.33 (*) <0.30 ng/mL   Comment:            Due to the release kinetics of cTnI,     a negative result within the first hours     of the onset of symptoms does not rule out     myocardial infarction with certainty.     If myocardial infarction is still suspected,     repeat the test at appropriate intervals.     CRITICAL RESULT CALLED TO, READ BACK BY AND VERIFIED WITH:     PENNINGTON,P RN @ 1045 ON 02/25/13 BY LEONARD,A  CBC     Status: Abnormal   Collection Time    02/26/13  8:40 AM      Result Value Range   WBC 15.0 (*) 4.0 - 10.5 K/uL  RBC 3.56 (*) 3.87 - 5.11 MIL/uL   Hemoglobin 8.8 (*) 12.0 - 15.0 g/dL   HCT 46.9 (*) 62.9 - 52.8 %   MCV 77.2 (*) 78.0 - 100.0 fL   MCH 24.7 (*) 26.0 - 34.0 pg   MCHC 32.0  30.0 - 36.0 g/dL   RDW 41.3 (*) 24.4 - 01.0 %   Platelets 295  150 - 400 K/uL  BASIC METABOLIC PANEL     Status: Abnormal   Collection Time    02/26/13  8:40  AM      Result Value Range   Sodium 143  135 - 145 mEq/L   Potassium 3.2 (*) 3.5 - 5.1 mEq/L   Chloride 110  96 - 112 mEq/L   CO2 23  19 - 32 mEq/L   Glucose, Bld 89  70 - 99 mg/dL   BUN 17  6 - 23 mg/dL   Creatinine, Ser 2.72 (*) 0.50 - 1.10 mg/dL   Calcium 9.0  8.4 - 53.6 mg/dL   GFR calc non Af Amer 50 (*) >90 mL/min   GFR calc Af Amer 58 (*) >90 mL/min   Comment:            The eGFR has been calculated     using the CKD EPI equation.     This calculation has not been     validated in all clinical     situations.     eGFR's persistently     <90 mL/min signify     possible Chronic Kidney Disease.  PRO B NATRIURETIC PEPTIDE     Status: Abnormal   Collection Time    02/26/13  8:40 AM      Result Value Range   Pro B Natriuretic peptide (BNP) 27114.0 (*) 0 - 125 pg/mL  MAGNESIUM     Status: None   Collection Time    02/27/13  8:15 AM      Result Value Range   Magnesium 1.8  1.5 - 2.5 mg/dL  CBC     Status: Abnormal   Collection Time    02/27/13  8:15 AM      Result Value Range   WBC 14.2 (*) 4.0 - 10.5 K/uL   RBC 3.32 (*) 3.87 - 5.11 MIL/uL   Hemoglobin 8.4 (*) 12.0 - 15.0 g/dL   HCT 64.4 (*) 03.4 - 74.2 %   MCV 77.7 (*) 78.0 - 100.0 fL   MCH 25.3 (*) 26.0 - 34.0 pg   MCHC 32.6  30.0 - 36.0 g/dL   RDW 59.5 (*) 63.8 - 75.6 %   Platelets 324  150 - 400 K/uL  BASIC METABOLIC PANEL     Status: Abnormal   Collection Time    02/27/13  8:15 AM      Result Value Range   Sodium 140  135 - 145 mEq/L   Potassium 2.9 (*) 3.5 - 5.1 mEq/L   Chloride 108  96 - 112 mEq/L   CO2 23  19 - 32 mEq/L   Glucose, Bld 95  70 - 99 mg/dL   BUN 16  6 - 23 mg/dL   Creatinine, Ser 4.33  0.50 - 1.10 mg/dL   Calcium 8.7  8.4 - 29.5 mg/dL   GFR calc non Af Amer 58 (*) >90 mL/min   GFR calc Af Amer 68 (*) >90 mL/min   Comment:            The eGFR has been calculated  using the CKD EPI equation.     This calculation has not been     validated in all clinical     situations.     eGFR's  persistently     <90 mL/min signify     possible Chronic Kidney Disease.    Imaging: Dg Chest 2 View  02/26/2013   *RADIOLOGY REPORT*  Clinical Data: Congestive heart failure.  Pneumonia.  CHEST - 2 VIEW  Comparison: 06/23 and 02/21/2013 and 01/28/2012  Findings: There has been  improvement in the hazy infiltrates bilaterally.  There is a new small right perihilar infiltrate and there is a focal area of consolidation at the left lung base.  The patient has new bilateral pleural effusions.  The heart size and pulmonary vascularity remain normal.  No acute osseous abnormality.  IMPRESSION:  Improving bibasilar infiltrates although there is a new small right perihilar infiltrate and there is consolidation at the left lung base.  New small bilateral pleural effusions.  Findings are consistent with pneumonia with parapneumonic effusions.   Original Report Authenticated By: Francene Boyers, M.D.    Assessment:  1. Principal Problem: 2.   Sepsis associated hypotension 3. Active Problems: 4.   Seizure 5.   Hypercholesteremia 6.   GERD (gastroesophageal reflux disease) 7.   UTI (lower urinary tract infection) 8.   ARF (acute renal failure) 9.   Fall 10.   Altered mental status 11.   Severe sepsis 12.   CHF, acute - mild 13.   Plan:  She appears to have diuresed, but may now be volume up. Only 400 cc recorded out last night. Weight somewhat higher today. Effusions have improved, however. Breathing is better. Re-check BNP and troponin today. Will need some type of ischemia evaluation in the near future due to acute decrease in LVEF - global HK, may be due to shock.  Spoke with family and had a dietary discussion about salt-restriction and healthy eating (they were going to get her [employer] for lunch).  Will switch diet to low-sodium.  K+ 2.9 today and Mg 1.8.  I have ordered 2g magnesium and 80 MEQ of K+ today. Will restart daily 20 mg lasix dosing as she appears to be net positive.  Add low dose  toprol XL 12.5 mg daily for CHF.  She is on irbesartan.  Will also add aspirin as she is not on that either and she has had a NSTEMI.  Time Spent Directly with Patient:  15 minutes  Length of Stay:  LOS: 6 days   Jordan Nose, MD, [facility] Attending Cardiologist [facility]  Jordan Rivas 02/27/2013, 9:07 AM

## 2013-02-27 NOTE — Progress Notes (Signed)
BP 139/108 Dr. Benjamine Mola notifed. Orders to check manually and notify.

## 2013-02-27 NOTE — Progress Notes (Signed)
Magnesium hung at 50 cc/hr pt not able to tolerate. Rate changed to 35cc/hr. Tolerating well

## 2013-02-28 DIAGNOSIS — I5041 Acute combined systolic (congestive) and diastolic (congestive) heart failure: Secondary | ICD-10-CM

## 2013-02-28 LAB — BASIC METABOLIC PANEL
BUN: 13 mg/dL (ref 6–23)
Calcium: 9 mg/dL (ref 8.4–10.5)
Creatinine, Ser: 1.02 mg/dL (ref 0.50–1.10)
GFR calc Af Amer: 71 mL/min — ABNORMAL LOW (ref >90)
GFR calc non Af Amer: 61 mL/min — ABNORMAL LOW (ref >90)

## 2013-02-28 MED ORDER — SPIRONOLACTONE 25 MG PO TABS
25.0000 mg | ORAL_TABLET | Freq: Every day | ORAL | Status: DC
  Administered 2013-02-28 – 2013-03-03 (×4): 25 mg via ORAL
  Filled 2013-02-28 (×4): qty 1

## 2013-02-28 NOTE — Progress Notes (Signed)
DAILY PROGRESS NOTE  Subjective:  No events overnight. 650 recorded out, but net positive. She has been having loose stools. No weight recorded today.  Objective:  Temp:  [98.1 F (36.7 C)-99 F (37.2 C)] 98.9 F (37.2 C) (06/28 0459) Pulse Rate:  [82-95] 82 (06/28 0459) Resp:  [16-18] 18 (06/28 0459) BP: (136-152)/(70-108) 148/83 mmHg (06/28 0459) SpO2:  [82 %-100 %] 98 % (06/28 0459) Weight:  [183 lb 6.8 oz (83.201 kg)] 183 lb 6.8 oz (83.201 kg) (06/27 2045) Weight change: 0 lb (0 kg)  Intake/Output from previous day: 06/27 0701 - 06/28 0700 In: 890 [P.O.:840; IV Piggyback:50] Out: 653 [Urine:650; Stool:3]  Intake/Output from this shift:    Medications: Current Facility-Administered Medications  Medication Dose Route Frequency Provider Last Rate Last Dose  . acetaminophen (TYLENOL) tablet 650 mg  650 mg Oral Q6H PRN Maretta Bees, MD   650 mg at 02/21/13 1106   Or  . acetaminophen (TYLENOL) suppository 650 mg  650 mg Rectal Q6H PRN Maretta Bees, MD      . albuterol (PROVENTIL) (5 MG/ML) 0.5% nebulizer solution 2.5 mg  2.5 mg Nebulization Q2H PRN Maretta Bees, MD   2.5 mg at 02/25/13 0002  . amitriptyline (ELAVIL) tablet 75 mg  75 mg Oral QHS Joseph Art, DO   75 mg at 02/27/13 2154  . amoxicillin-clavulanate (AUGMENTIN) 875-125 MG per tablet 1 tablet  1 tablet Oral Q12H Joseph Art, DO   1 tablet at 02/27/13 2154  . atorvastatin (LIPITOR) tablet 20 mg  20 mg Oral QPM Maretta Bees, MD   20 mg at 02/27/13 1624  . DULoxetine (CYMBALTA) DR capsule 60 mg  60 mg Oral Daily Maretta Bees, MD   60 mg at 02/27/13 1155  . feeding supplement (ENSURE) pudding 1 Container  1 Container Oral TID BM Maretta Bees, MD   1 Container at 02/24/13 2123  . furosemide (LASIX) tablet 20 mg  20 mg Oral Daily Chrystie Nose, MD   20 mg at 02/27/13 1159  . heparin injection 5,000 Units  5,000 Units Subcutaneous Q8H Maretta Bees, MD   5,000 Units at 02/28/13  0622  . irbesartan (AVAPRO) tablet 75 mg  75 mg Oral Daily Marykay Lex, MD   75 mg at 02/27/13 1156  . levETIRAcetam (KEPPRA) tablet 500 mg  500 mg Oral BID Maretta Bees, MD   500 mg at 02/27/13 2153  . loperamide (IMODIUM) capsule 2 mg  2 mg Oral PRN Maretta Bees, MD   2 mg at 02/26/13 2210  . metoprolol succinate (TOPROL-XL) 24 hr tablet 12.5 mg  12.5 mg Oral Daily Chrystie Nose, MD   12.5 mg at 02/27/13 1158  . ondansetron (ZOFRAN) tablet 4 mg  4 mg Oral Q6H PRN Maretta Bees, MD   4 mg at 02/24/13 1034   Or  . ondansetron [facility]) injection 4 mg  4 mg Intravenous Q6H PRN Maretta Bees, MD   4 mg at 02/22/13 2045  . oxybutynin (DITROPAN-XL) 24 hr tablet 10 mg  10 mg Oral BID Maretta Bees, MD   10 mg at 02/27/13 2155  . oxyCODONE (Oxy IR/ROXICODONE) immediate release tablet 5 mg  5 mg Oral Q6H PRN Maretta Bees, MD   5 mg at 02/27/13 1621  . pantoprazole (PROTONIX) EC tablet 40 mg  40 mg Oral BID Maretta Bees, MD   40 mg at 02/27/13 2153  .  predniSONE (DELTASONE) tablet 30 mg  30 mg Oral Q breakfast Joseph Art, DO   30 mg at 02/28/13 0829  . pregabalin (LYRICA) capsule 75 mg  75 mg Oral BID Maretta Bees, MD   75 mg at 02/27/13 2153  . saccharomyces boulardii (FLORASTOR) capsule 250 mg  250 mg Oral BID Maretta Bees, MD   250 mg at 02/27/13 2153  . sodium chloride 0.9 % injection 3 mL  3 mL Intravenous Q12H Maretta Bees, MD   3 mL at 02/27/13 2202    Physical Exam: General appearance: alert and no distress Neck: JVD - 2 cm above sternal notch, no adenopathy, no carotid bruit, supple, symmetrical, trachea midline and thyroid not enlarged, symmetric, no tenderness/mass/nodules Lungs: clear to auscultation bilaterally Heart: regular rate and rhythm, S1, S2 normal, no murmur, click, rub or gallop Abdomen: soft, non-tender; bowel sounds normal; no masses,  no organomegaly and obese Extremities: extremities normal, atraumatic, no cyanosis or  edema Pulses: 2+ and symmetric Skin: Skin color, texture, turgor normal. No rashes or lesions Neurologic: Mental status: Alert, oriented, thought content appropriate, left hemiplegia  Lab Results: Results for orders placed during the hospital encounter of 02/21/13 (from the past 48 hour(s))  MAGNESIUM     Status: None   Collection Time    02/27/13  8:15 AM      Result Value Range   Magnesium 1.8  1.5 - 2.5 mg/dL  CBC     Status: Abnormal   Collection Time    02/27/13  8:15 AM      Result Value Range   WBC 14.2 (*) 4.0 - 10.5 K/uL   RBC 3.32 (*) 3.87 - 5.11 MIL/uL   Hemoglobin 8.4 (*) 12.0 - 15.0 g/dL   HCT 16.1 (*) 09.6 - 04.5 %   MCV 77.7 (*) 78.0 - 100.0 fL   MCH 25.3 (*) 26.0 - 34.0 pg   MCHC 32.6  30.0 - 36.0 g/dL   RDW 40.9 (*) 81.1 - 91.4 %   Platelets 324  150 - 400 K/uL  BASIC METABOLIC PANEL     Status: Abnormal   Collection Time    02/27/13  8:15 AM      Result Value Range   Sodium 140  135 - 145 mEq/L   Potassium 2.9 (*) 3.5 - 5.1 mEq/L   Chloride 108  96 - 112 mEq/L   CO2 23  19 - 32 mEq/L   Glucose, Bld 95  70 - 99 mg/dL   BUN 16  6 - 23 mg/dL   Creatinine, Ser 7.82  0.50 - 1.10 mg/dL   Calcium 8.7  8.4 - 95.6 mg/dL   GFR calc non Af Amer 58 (*) >90 mL/min   GFR calc Af Amer 68 (*) >90 mL/min   Comment:            The eGFR has been calculated     using the CKD EPI equation.     This calculation has not been     validated in all clinical     situations.     eGFR's persistently     <90 mL/min signify     possible Chronic Kidney Disease.  TROPONIN I     Status: None   Collection Time    02/27/13  8:15 AM      Result Value Range   Troponin I <0.30  <0.30 ng/mL   Comment:  Due to the release kinetics of cTnI,     a negative result within the first hours     of the onset of symptoms does not rule out     myocardial infarction with certainty.     If myocardial infarction is still suspected,     repeat the test at appropriate intervals.  PRO  B NATRIURETIC PEPTIDE     Status: Abnormal   Collection Time    02/27/13  8:15 AM      Result Value Range   Pro B Natriuretic peptide (BNP) 30585.0 (*) 0 - 125 pg/mL  BASIC METABOLIC PANEL     Status: Abnormal   Collection Time    02/27/13  9:09 PM      Result Value Range   Sodium 142  135 - 145 mEq/L   Potassium 4.3  3.5 - 5.1 mEq/L   Comment: DELTA CHECK NOTED   Chloride 109  96 - 112 mEq/L   CO2 23  19 - 32 mEq/L   Glucose, Bld 112 (*) 70 - 99 mg/dL   BUN 15  6 - 23 mg/dL   Creatinine, Ser 6.04  0.50 - 1.10 mg/dL   Calcium 8.9  8.4 - 54.0 mg/dL   GFR calc non Af Amer 63 (*) >90 mL/min   GFR calc Af Amer 73 (*) >90 mL/min   Comment:            The eGFR has been calculated     using the CKD EPI equation.     This calculation has not been     validated in all clinical     situations.     eGFR's persistently     <90 mL/min signify     possible Chronic Kidney Disease.  BASIC METABOLIC PANEL     Status: Abnormal   Collection Time    02/28/13  5:40 AM      Result Value Range   Sodium 144  135 - 145 mEq/L   Potassium 3.9  3.5 - 5.1 mEq/L   Chloride 112  96 - 112 mEq/L   CO2 25  19 - 32 mEq/L   Glucose, Bld 93  70 - 99 mg/dL   BUN 13  6 - 23 mg/dL   Creatinine, Ser 9.81  0.50 - 1.10 mg/dL   Calcium 9.0  8.4 - 19.1 mg/dL   GFR calc non Af Amer 61 (*) >90 mL/min   GFR calc Af Amer 71 (*) >90 mL/min   Comment:            The eGFR has been calculated     using the CKD EPI equation.     This calculation has not been     validated in all clinical     situations.     eGFR's persistently     <90 mL/min signify     possible Chronic Kidney Disease.    Imaging: Dg Chest 2 View  02/26/2013   *RADIOLOGY REPORT*  Clinical Data: Congestive heart failure.  Pneumonia.  CHEST - 2 VIEW  Comparison: 06/23 and 02/21/2013 and 01/28/2012  Findings: There has been  improvement in the hazy infiltrates bilaterally.  There is a new small right perihilar infiltrate and there is a focal area of  consolidation at the left lung base.  The patient has new bilateral pleural effusions.  The heart size and pulmonary vascularity remain normal.  No acute osseous abnormality.  IMPRESSION:  Improving bibasilar infiltrates although there is a  new small right perihilar infiltrate and there is consolidation at the left lung base.  New small bilateral pleural effusions.  Findings are consistent with pneumonia with parapneumonic effusions.   Original Report Authenticated By: Francene Boyers, M.D.    Assessment:  1. Principal Problem: 2.   Sepsis associated hypotension 3. Active Problems: 4.   Seizure 5.   Hypercholesteremia 6.   GERD (gastroesophageal reflux disease) 7.   UTI (lower urinary tract infection) 8.   ARF (acute renal failure) 9.   Fall 10.   Altered mental status 11.   Severe sepsis 12.   CHF, acute - mild 13.   NSTEMI (non-ST elevated myocardial infarction) 14.   Plan:  1. No chest pain. Breathing comfortably. BNP is elevated. Troponin is negative - the mild elevation may have been due to heart failure. I's/O's indicate net fluid gain. We may need to increase the lasix to 20 BID. Potassium is again declining - add aldactone for K+ sparing.  I would recommend a lexiscan either Monday or as an outpatient in our office for ischemia evaluation. Please measure and record strict I's/O's and daily weights.  Time Spent Directly with Patient:  15 minutes  Length of Stay:  LOS: 7 days   Chrystie Nose, MD, [facility] Attending Cardiologist [facility]  HILTY,Kenneth C 02/28/2013, 9:13 AM

## 2013-02-28 NOTE — Progress Notes (Signed)
[facility]  PROGRESS NOTE    PATIENT DETAILS Name: Jordan Rivas Age: 52 y.o. Sex: female Date of Birth: September 18, 1960 Admit Date: 02/21/2013 Admitting Physician Ron Parker, MD ZOX:[id number], MD  Brief Summary Patient is a 52 year old African American female with a past medical history of intracranial hemorrhage approximately 18 years ago with resultant left-sided hemiplegia, admitted to the step down unit following frequent falls, hypertension and a UTI. Patient required multiple units of IV fluid boluses, and stress dose steroids, along with antibiotics. When she was stabilized she was moved to the regular medical surgical unit. She was doing well, urine cultures are positive for pansensitive Escherichia coli. However on 6/23 she developed slight shortness of breath and wheezing, x-ray of the chest done on 6/23 was also by pneumonia. Her BNP was. It is thought that this probably a combination of  pneumonia and possible CHF decompensation.  She is being tapered off her hydrocortisone that was started for relative adrenal insufficiency in the setting of hypotension.  Assessment/Plan: Sepsis associated hypotension -patient was admitted to the SDU and given fluid boluses, however was still hypotensive, cortisol level was checked-it was 9-given stress dose hydrocortisone with good response. -much improved after starting hydrocortisone-BP now back to normal- transition off of IV to PO steroids- slow taper outpatient - Urine culture shows pansensitive Escherichia coli- previously on Rocephin,  transitioned to Zosyn to cover for pneumonia- will change to augmentin to complete course outpatient - Blood cultures 6/21-negative Acute Decompensated systolic CHF - BNP significantly elevated on 6/23, mild congestive findings on chest x-ray as well. She did receive his 8-9 L of IV fluid boluses on admission.. - appreciate cardiology seeing: echo done 6/22 shows no wall abnormalities and EF  55% and echo done 6/24 shows diffuse hypokinesis and EF 40-45% - added BB, on ACE, ASA and Aldactone. - lasix PO - plan for lexiscan Monday Leukoctosis -steroid related - no fevers UTI (lower urinary tract infection)/right-sided pyelonephritis -causing above - abx  HCAP/aspiration pneumonia - Chest x-ray on admission did not show pneumonia, however on 6/23 a repeat x-ray on the patient with short of breath does show pneumonia. - Given her history of a prior CVA and resultant hemiplegia and mild dysarthria, there is some suspicion for aspiration pneumonia, on oral Augmentin. NSTEMI ASA Hypokalemia- -replace with Mg Relative Adrenal Insufficiency -cortisol inappropriately low normal in the setting of hypotension -taper  ARF (acute renal failure)  - patient had significant renal failure with a peak creatinine of 2.28 on admission. Renal failure resolved with hydration. This is likely prerenal in the setting of severe sepsis -renal ultrasound-neg for hydronephrosis Altered mental status  -resolved - Likely secondary to toxic metabolite encephalopathy from narcotics and renal failure. CT of the head is negative for acute abnormalities.  Acute urinary retention  - Removed Foley 6/23-per RN pt voiding   Fall  - Likely secondary to hypotension  - CT of the head negative for acute abnormalities -PT recs of home health Diarrhea - C. difficile PCR negative -Start probiotics and Imodium prn-better today HTN -controlled but BP slowly creeping up, resume verapamil and Benicar when able Seizure  - Continue Keppra  . GERD (gastroesophageal reflux disease)  - Continue with PPI  Hypercholesteremia -resume statins Chronic pain syndrome - When necessary oxycodone  -resume Lyrica, Cymbalta and Elavil DVT Prophylaxis: Heparin   Code Status: Full code   Family Communication patient  Procedures:  None  CONSULTS: PCCM Cardiology  Subjective: - no complaints today  PHYSICAL  EXAM: Vital signs in last 24 hours: Filed Vitals:   02/27/13 1430 02/27/13 1802 02/27/13 2045 02/28/13 0459  BP: 138/70 152/107 136/74 148/83  Pulse:  86 83 82  Temp:  98.1 F (36.7 C) 98.3 F (36.8 C) 98.9 F (37.2 C)  TempSrc:  Oral Oral Oral  Resp:  18 18 18   Height:   5\' 1"  (1.549 m)   Weight:   83.201 kg (183 lb 6.8 oz)   SpO2:  97% 100% 98%    Weight change: 0 kg (0 lb) Filed Weights   02/25/13 2034 02/26/13 2057 02/27/13 2045  Weight: 83.201 kg (183 lb 6.8 oz) 83.201 kg (183 lb 6.8 oz) 83.201 kg (183 lb 6.8 oz)   Body mass index is 34.68 kg/(m^2).   Gen Exam: Awake and alert with clear speech.   Neck: Supple, No JVD.   Chest: B/L Clear.   CVS: S1 S2 Regular, no murmurs.  Abdomen: soft, BS +, non tender, non distended.  Extremities: no edema, lower extremities warm to touch. Neurologic: Chronic left hemiplegia-upper ext fingers with flexion contractures   Skin: No Rash.   Wounds: N/A.   Intake/Output from previous day:  Intake/Output Summary (Last 24 hours) at 02/28/13 1031 Last data filed at 02/28/13 0502  Gross per 24 hour  Intake    770 ml  Output    253 ml  Net    517 ml     LAB RESULTS: CBC  Recent Labs Lab 02/22/13 0430 02/25/13 0940 02/26/13 0840 02/27/13 0815  WBC 8.2 11.9* 15.0* 14.2*  HGB 8.3* 8.6* 8.8* 8.4*  HCT 26.1* 26.4* 27.5* 25.8*  PLT 280 284 295 324  MCV 79.8 76.1* 77.2* 77.7*  MCH 25.4* 24.8* 24.7* 25.3*  MCHC 31.8 32.6 32.0 32.6  RDW 15.5 15.5 15.6* 15.9*    Chemistries   Recent Labs Lab 02/25/13 0940 02/26/13 0840 02/27/13 0815 02/27/13 2109 02/28/13 0540  NA 143 143 140 142 144  K 3.2* 3.2* 2.9* 4.3 3.9  CL 109 110 108 109 112  CO2 22 23 23 23 25   GLUCOSE 144* 89 95 112* 93  BUN 19 17 16 15 13   CREATININE 1.31* 1.21* 1.06 1.00 1.02  CALCIUM 8.5 9.0 8.7 8.9 9.0  MG  --   --  1.8  --   --     GFR Estimated Creatinine Clearance: 61.7 ml/min (by C-G formula based on Cr of 1.02).  Cardiac Enzymes  Recent  Labs Lab 02/25/13 0940 02/27/13 0815  TROPONINI 0.33* <0.30   MICROBIOLOGY: Recent Results (from the past 240 hour(s))  URINE CULTURE     Status: None   Collection Time    02/21/13  3:47 AM      Result Value Range Status   Specimen Description URINE, CATHETERIZED   Final   Special Requests CX ADDED AT 0408 ON [id number]   Final   Culture  Setup Time 02/21/2013 15:24   Final   Colony Count >=100,000 COLONIES/ML   Final   Culture ESCHERICHIA COLI   Final   Report Status 02/23/2013 FINAL   Final   Organism ID, Bacteria ESCHERICHIA COLI   Final  CULTURE, BLOOD (ROUTINE X 2)     Status: None   Collection Time    02/21/13  6:53 AM      Result Value Range Status   Specimen Description BLOOD RIGHT HAND   Final   Special Requests BOTTLES DRAWN AEROBIC AND ANAEROBIC 5CC   Final   Culture  Setup Time 02/21/2013 15:12   Final   Culture NO GROWTH 5 DAYS   Final   Report Status 02/27/2013 FINAL   Final  CULTURE, BLOOD (ROUTINE X 2)     Status: None   Collection Time    02/21/13 10:10 AM      Result Value Range Status   Specimen Description BLOOD LEFT HAND   Final   Special Requests BOTTLES DRAWN AEROBIC ONLY 5CC   Final   Culture  Setup Time 02/21/2013 15:10   Final   Culture NO GROWTH 5 DAYS   Final   Report Status 02/27/2013 FINAL   Final  MRSA PCR SCREENING     Status: None   Collection Time    02/21/13 10:37 AM      Result Value Range Status   MRSA by PCR NEGATIVE  NEGATIVE Final   Comment:            The GeneXpert MRSA Assay (FDA     approved for NASAL specimens     only), is one component of a     comprehensive MRSA colonization     surveillance program. It is not     intended to diagnose MRSA     infection nor to guide or     monitor treatment for     MRSA infections.  CLOSTRIDIUM DIFFICILE BY PCR     Status: None   Collection Time    02/23/13  8:48 AM      Result Value Range Status   C difficile by pcr NEGATIVE  NEGATIVE Final   Scheduled Meds: . amitriptyline  75 mg  Oral QHS  . amoxicillin-clavulanate  1 tablet Oral Q12H  . atorvastatin  20 mg Oral QPM  . DULoxetine  60 mg Oral Daily  . feeding supplement  1 Container Oral TID BM  . furosemide  20 mg Oral Daily  . heparin  5,000 Units Subcutaneous Q8H  . irbesartan  75 mg Oral Daily  . levETIRAcetam  500 mg Oral BID  . metoprolol succinate  12.5 mg Oral Daily  . oxybutynin  10 mg Oral BID  . pantoprazole  40 mg Oral BID  . predniSONE  30 mg Oral Q breakfast  . pregabalin  75 mg Oral BID  . saccharomyces boulardii  250 mg Oral BID  . sodium chloride  3 mL Intravenous Q12H  . spironolactone  25 mg Oral Daily   Continuous Infusions:  PRN Meds:.acetaminophen, acetaminophen, albuterol, loperamide, ondansetron (ZOFRAN) IV, ondansetron, oxyCODONE   RADIOLOGY STUDIES/RESULTS: Dg Chest 2 View  02/21/2013   *RADIOLOGY REPORT*  Clinical Data: Altered mental status.  Fall.  CHEST - 2 VIEW  Comparison: 01/28/2012  Findings: The heart, mediastinum and hila are unremarkable.  The lungs are clear.  No pleural effusion or pneumothorax.  The bony thorax is demineralized but intact.  IMPRESSION: No acute cardiopulmonary disease.   Original Report Authenticated By: Amie Portland, M.D.   Ct Head Wo Contrast  02/21/2013   *RADIOLOGY REPORT*  Clinical Data: Fall, left side paralysis from prior stroke, history hypertension, seizure  CT HEAD WITHOUT CONTRAST  Technique:  Contiguous axial images were obtained from the base of the skull through the vertex without contrast.  Comparison: 01/28/2012  Findings: Generalized atrophy. Ex vacuo dilatation of the right lateral ventricle again identified secondary to large area of right MCA infarction involving the right parietal lobe and basal ganglia. No midline shift or mass effect. Small vessel chronic ischemic changes of deep  cerebral white matter. No intracranial hemorrhage or evidence of acute infarction. No extra-axial fluid collections. Postsurgical changes of right  frontoparietal craniotomy. Hyperdense nodule para falcine at the posterior right parietal region 11 x 9 mm consistent with meningioma, unchanged. Dense calcification within anterior falx. No new intracranial mass lesions.  IMPRESSION: Large old right MCA infarct. Mild small vessel chronic ischemic changes deep cerebral white matter. Stable right parafalcine meningioma posteriorly. Postsurgical changes right frontoparietal craniotomy. No new intracranial abnormalities.   Original Report Authenticated By: Ulyses Southward, M.D.   US Renal  02/21/2013   *RADIOLOGY REPORT*  Clinical Data: Acute renal failure  RENAL/URINARY TRACT ULTRASOUND COMPLETE  Comparison:  CT, 11/03/2010  Findings:  Right Kidney:  Right kidney is normal in overall size and echogenicity.  There is mild renal cortical thinning.  No renal masses or stones.  No hydronephrosis.  The right kidney measures 9.9 cm.  Left Kidney:  Left kidney is normal in overall size echogenicity. No left renal masses or stones or hydronephrosis.  Left kidney measures 10.2 cm.  Bladder:  Unremarkable  IMPRESSION: Mild right renal cortical thinning.  Exam is otherwise unremarkable.  No hydronephrosis.   Original Report Authenticated By: Amie Portland, M.D.   Pamella Pert, MD   [phone] If 7PM-7AM, please contact night-coverage [email or web address] Password [facility] 02/28/2013, 10:31 AM   LOS: 7 days

## 2013-03-01 DIAGNOSIS — E7800 Pure hypercholesterolemia, unspecified: Secondary | ICD-10-CM

## 2013-03-01 LAB — CBC
MCH: 25.1 pg — ABNORMAL LOW (ref 26.0–34.0)
MCHC: 32.2 g/dL (ref 30.0–36.0)
RDW: 16.6 % — ABNORMAL HIGH (ref 11.5–15.5)

## 2013-03-01 LAB — BASIC METABOLIC PANEL
Calcium: 9 mg/dL (ref 8.4–10.5)
GFR calc Af Amer: 67 mL/min — ABNORMAL LOW (ref >90)
GFR calc non Af Amer: 58 mL/min — ABNORMAL LOW (ref >90)
Potassium: 3.1 mEq/L — ABNORMAL LOW (ref 3.5–5.1)
Sodium: 142 mEq/L (ref 135–145)

## 2013-03-01 MED ORDER — POTASSIUM CHLORIDE CRYS ER 20 MEQ PO TBCR
40.0000 meq | EXTENDED_RELEASE_TABLET | Freq: Every day | ORAL | Status: DC
  Administered 2013-03-01 – 2013-03-03 (×3): 40 meq via ORAL
  Filled 2013-03-01 (×4): qty 2

## 2013-03-01 MED ORDER — METOPROLOL SUCCINATE ER 25 MG PO TB24
25.0000 mg | ORAL_TABLET | Freq: Every day | ORAL | Status: DC
  Administered 2013-03-01 – 2013-03-03 (×3): 25 mg via ORAL
  Filled 2013-03-01 (×4): qty 1

## 2013-03-01 MED ORDER — ASPIRIN 81 MG PO CHEW
81.0000 mg | CHEWABLE_TABLET | Freq: Every day | ORAL | Status: DC
  Administered 2013-03-01 – 2013-03-03 (×3): 81 mg via ORAL
  Filled 2013-03-01 (×3): qty 1

## 2013-03-01 NOTE — Progress Notes (Addendum)
Subjective: No events overnight. She is finally net negative (-200 recorded). Weight is down to 80 Kg from 82 kg.  Objective: Vital signs in last 24 hours: Temp:  [97.8 F (36.6 C)-98.2 F (36.8 C)] 97.8 F (36.6 C) (06/29 0616) Pulse Rate:  [70-92] 92 (06/29 0616) Resp:  [16-18] 18 (06/29 0616) BP: (111-147)/(75-84) 111/75 mmHg (06/29 0616) SpO2:  [94 %-98 %] 94 % (06/29 0616) Weight:  [177 lb 4 oz (80.4 kg)] 177 lb 4 oz (80.4 kg) (06/28 2153) Weight change: -6 lb 2.8 oz (-2.801 kg) Last BM Date: 02/28/13 Intake/Output from previous day: -200 (total this admit +4670)  Though wt down from 187 to 177 06/28 0701 - 06/29 0700 In: 600 [P.O.:600] Out: 800 [Urine:800] Intake/Output this shift:    PE:  Gen: Awake, NAD HEENT: PERRLA, EOMI, MMM, no adenopathy CV: RRR, s1/s2, no M/R/G's Pulm: lungs clear Abd: obese, soft, non-tender Ext: 2+ pulses, trace edema Neuro: A&Ox3, left hemiparesis, swelling of the LUE Psych: Mood and affect normal  Tele:  SR  Lab Results:  Recent Labs  02/27/13 0815 03/01/13 0430  WBC 14.2* 14.9*  HGB 8.4* 8.9*  HCT 25.8* 27.6*  PLT 324 324   BMET  Recent Labs  02/28/13 0540 03/01/13 0430  NA 144 142  K 3.9 3.1*  CL 112 107  CO2 25 25  GLUCOSE 93 107*  BUN 13 11  CREATININE 1.02 1.07  CALCIUM 9.0 9.0    Recent Labs  02/27/13 0815  TROPONINI <0.30    Lab Results  Component Value Date   CHOL 133 01/28/2012   HDL 38* 01/28/2012   LDLCALC 73 01/28/2012   TRIG 109 01/28/2012   CHOLHDL 3.5 01/28/2012   Lab Results  Component Value Date   HGBA1C 6.2* 01/28/2012     No results found for this basename: TSH      Studies/Results: No results found.  Medications: I have reviewed the patient's current medications. Scheduled Meds: . amitriptyline  75 mg Oral QHS  . amoxicillin-clavulanate  1 tablet Oral Q12H  . atorvastatin  20 mg Oral QPM  . DULoxetine  60 mg Oral Daily  . feeding supplement  1 Container Oral TID BM  .  furosemide  20 mg Oral Daily  . heparin  5,000 Units Subcutaneous Q8H  . irbesartan  75 mg Oral Daily  . levETIRAcetam  500 mg Oral BID  . metoprolol succinate  12.5 mg Oral Daily  . oxybutynin  10 mg Oral BID  . pantoprazole  40 mg Oral BID  . predniSONE  30 mg Oral Q breakfast  . pregabalin  75 mg Oral BID  . saccharomyces boulardii  250 mg Oral BID  . sodium chloride  3 mL Intravenous Q12H  . spironolactone  25 mg Oral Daily   Continuous Infusions:  PRN Meds:.acetaminophen, acetaminophen, albuterol, loperamide, ondansetron (ZOFRAN) IV, ondansetron, oxyCODONE  Assessment/Plan: Principal Problem:   Sepsis associated hypotension Active Problems:   Seizure   Hypercholesteremia   GERD (gastroesophageal reflux disease)   UTI (lower urinary tract infection)   ARF (acute renal failure)   Fall   Altered mental status   Severe sepsis   Acute combined systolic and diastolic heart failure   NSTEMI (non-ST elevated myocardial infarction)  PLAN:  LOS: 8 days   Time spent with pt. :15 minutes. [facility] R  Nurse Practitioner Certified Pager [phone] 03/01/2013, 8:19 AM   Pt. Seen and examined. Agree with the NP/PA-C note as written.  BP  remains elevated -increase Toprol XL to 25 mg daily. K+ is still drifting down. Add daily potassium repletion. H/H stable. Renal function is stable. Plan for NST tomorrow to evaluate mild troponin elevation and reduced LV systolic function.  Chrystie Nose, MD, [facility] Attending Cardiologist [facility]

## 2013-03-01 NOTE — Progress Notes (Signed)
[facility]  PROGRESS NOTE   PATIENT DETAILS Name: Jordan Rivas Age: 52 y.o. Sex: female Date of Birth: 1961/08/13 Admit Date: 02/21/2013 Admitting Physician Ron Parker, MD ZOX:[id number], MD  Brief Summary Patient is a 52 year old African American female with a past medical history of intracranial hemorrhage approximately 18 years ago with resultant left-sided hemiplegia, admitted to the step down unit following frequent falls, hypertension and a UTI. Patient required multiple units of IV fluid boluses, and stress dose steroids, along with antibiotics. When she was stabilized she was moved to the regular medical surgical unit. She was doing well, urine cultures are positive for pansensitive Escherichia coli. However on 6/23 she developed slight shortness of breath and wheezing, x-ray of the chest done on 6/23 was also by pneumonia. Her BNP was. It is thought that this probably a combination of  pneumonia and possible CHF decompensation.  She is being tapered off her hydrocortisone that was started for relative adrenal insufficiency in the setting of hypotension.  Assessment/Plan: Sepsis associated hypotension - patient was admitted to the SDU and given fluid boluses, however was still hypotensive, cortisol level was checked-it was 9-given stress dose hydrocortisone with good response. - much improved after starting hydrocortisone-BP now back to normal- transition off of IV to PO steroids- slow taper outpatient - Urine culture shows pansensitive Escherichia coli- previously on Rocephin,  transitioned to Zosyn to cover for pneumonia- will change to augmentin to complete course outpatient - Blood cultures 6/21-negative  Acute Decompensated systolic CHF - BNP significantly elevated on 6/23, mild congestive findings on chest x-ray as well. She did receive his 8-9 L of IV fluid boluses on admission.. - appreciate cardiology seeing: echo done 6/22 shows no wall abnormalities and  EF 55% and echo done 6/24 shows diffuse hypokinesis and EF 40-45% - added BB, on ACE, ASA and Aldactone. - lasix PO - plan for lexiscan Monday Leukoctosis -steroid related - no fevers UTI (lower urinary tract infection)/right-sided pyelonephritis -causing above - abx  HCAP/aspiration pneumonia - Chest x-ray on admission did not show pneumonia, however on 6/23 a repeat x-ray on the patient with short of breath does show pneumonia. - Given her history of a prior CVA and resultant hemiplegia and mild dysarthria, there is some suspicion for aspiration pneumonia, on oral Augmentin. NSTEMI ASA Hypokalemia- -replace Relative Adrenal Insufficiency -cortisol inappropriately low normal in the setting of hypotension -taper  ARF (acute renal failure)  - patient had significant renal failure with a peak creatinine of 2.28 on admission. Renal failure resolved with hydration. This is likely prerenal in the setting of severe sepsis -renal ultrasound-neg for hydronephrosis Altered mental status  -resolved - Likely secondary to toxic metabolite encephalopathy from narcotics and renal failure. CT of the head is negative for acute abnormalities.  Acute urinary retention  - Removed Foley 6/23-per RN pt voiding   Fall  - Likely secondary to hypotension  - CT of the head negative for acute abnormalities -PT recs of home health Diarrhea - C. difficile PCR negative -Start probiotics and Imodium prn-better today HTN -controlled but BP slowly creeping up, resume verapamil and Benicar when able Seizure  - Continue Keppra  . GERD (gastroesophageal reflux disease)  - Continue with PPI  Hypercholesteremia -resume statins Chronic pain syndrome - When necessary oxycodone  -resume Lyrica, Cymbalta and Elavil DVT Prophylaxis: Heparin   Code Status: Full code  Family Communication patient  Procedures:  None  CONSULTS: PCCM Cardiology  Subjective: - no complaints today  PHYSICAL  EXAM: Vital signs in last 24 hours: Filed Vitals:   02/28/13 1718 02/28/13 2052 02/28/13 2153 03/01/13 0616  BP: 147/84 144/84  111/75  Pulse: 79 70  92  Temp: 98.2 F (36.8 C) 98 F (36.7 C)  97.8 F (36.6 C)  TempSrc: Oral Oral  Oral  Resp: 18 18  18   Height:      Weight:   80.4 kg (177 lb 4 oz)   SpO2: 98% 96%  94%    Weight change: -2.801 kg (-6 lb 2.8 oz) Filed Weights   02/26/13 2057 02/27/13 2045 02/28/13 2153  Weight: 83.201 kg (183 lb 6.8 oz) 83.201 kg (183 lb 6.8 oz) 80.4 kg (177 lb 4 oz)   Body mass index is 33.51 kg/(m^2).   Gen Exam: Awake and alert with clear speech.   Neck: Supple, No JVD.   Chest: B/L Clear.   CVS: S1 S2 Regular, no murmurs.  Abdomen: soft, BS +, non tender, non distended.  Extremities: no edema, lower extremities warm to touch. Neurologic: Chronic left hemiplegia-upper ext fingers with flexion contractures   Skin: No Rash.   Wounds: N/A.   Intake/Output from previous day:  Intake/Output Summary (Last 24 hours) at 03/01/13 0943 Last data filed at 02/28/13 1700  Gross per 24 hour  Intake    360 ml  Output    800 ml  Net   -440 ml     LAB RESULTS: CBC  Recent Labs Lab 02/25/13 0940 02/26/13 0840 02/27/13 0815 03/01/13 0430  WBC 11.9* 15.0* 14.2* 14.9*  HGB 8.6* 8.8* 8.4* 8.9*  HCT 26.4* 27.5* 25.8* 27.6*  PLT 284 295 324 324  MCV 76.1* 77.2* 77.7* 78.0  MCH 24.8* 24.7* 25.3* 25.1*  MCHC 32.6 32.0 32.6 32.2  RDW 15.5 15.6* 15.9* 16.6*    Chemistries   Recent Labs Lab 02/26/13 0840 02/27/13 0815 02/27/13 2109 02/28/13 0540 03/01/13 0430  NA 143 140 142 144 142  K 3.2* 2.9* 4.3 3.9 3.1*  CL 110 108 109 112 107  CO2 23 23 23 25 25   GLUCOSE 89 95 112* 93 107*  BUN 17 16 15 13 11   CREATININE 1.21* 1.06 1.00 1.02 1.07  CALCIUM 9.0 8.7 8.9 9.0 9.0  MG  --  1.8  --   --   --     GFR Estimated Creatinine Clearance: 57.7 ml/min (by C-G formula based on Cr of 1.07).  Cardiac Enzymes  Recent Labs Lab  02/25/13 0940 02/27/13 0815  TROPONINI 0.33* <0.30   MICROBIOLOGY: Recent Results (from the past 240 hour(s))  URINE CULTURE     Status: None   Collection Time    02/21/13  3:47 AM      Result Value Range Status   Specimen Description URINE, CATHETERIZED   Final   Special Requests CX ADDED AT 0408 ON [id number]   Final   Culture  Setup Time 02/21/2013 15:24   Final   Colony Count >=100,000 COLONIES/ML   Final   Culture ESCHERICHIA COLI   Final   Report Status 02/23/2013 FINAL   Final   Organism ID, Bacteria ESCHERICHIA COLI   Final  CULTURE, BLOOD (ROUTINE X 2)     Status: None   Collection Time    02/21/13  6:53 AM      Result Value Range Status   Specimen Description BLOOD RIGHT HAND   Final   Special Requests BOTTLES DRAWN AEROBIC AND ANAEROBIC 5CC   Final   Culture  Setup Time  02/21/2013 15:12   Final   Culture NO GROWTH 5 DAYS   Final   Report Status 02/27/2013 FINAL   Final  CULTURE, BLOOD (ROUTINE X 2)     Status: None   Collection Time    02/21/13 10:10 AM      Result Value Range Status   Specimen Description BLOOD LEFT HAND   Final   Special Requests BOTTLES DRAWN AEROBIC ONLY 5CC   Final   Culture  Setup Time 02/21/2013 15:10   Final   Culture NO GROWTH 5 DAYS   Final   Report Status 02/27/2013 FINAL   Final  MRSA PCR SCREENING     Status: None   Collection Time    02/21/13 10:37 AM      Result Value Range Status   MRSA by PCR NEGATIVE  NEGATIVE Final   Comment:            The GeneXpert MRSA Assay (FDA     approved for NASAL specimens     only), is one component of a     comprehensive MRSA colonization     surveillance program. It is not     intended to diagnose MRSA     infection nor to guide or     monitor treatment for     MRSA infections.  CLOSTRIDIUM DIFFICILE BY PCR     Status: None   Collection Time    02/23/13  8:48 AM      Result Value Range Status   C difficile by pcr NEGATIVE  NEGATIVE Final   Scheduled Meds: . amitriptyline  75 mg Oral QHS   . amoxicillin-clavulanate  1 tablet Oral Q12H  . aspirin  81 mg Oral Daily  . atorvastatin  20 mg Oral QPM  . DULoxetine  60 mg Oral Daily  . feeding supplement  1 Container Oral TID BM  . furosemide  20 mg Oral Daily  . heparin  5,000 Units Subcutaneous Q8H  . irbesartan  75 mg Oral Daily  . levETIRAcetam  500 mg Oral BID  . metoprolol succinate  25 mg Oral Daily  . oxybutynin  10 mg Oral BID  . pantoprazole  40 mg Oral BID  . potassium chloride  40 mEq Oral Daily  . predniSONE  30 mg Oral Q breakfast  . pregabalin  75 mg Oral BID  . saccharomyces boulardii  250 mg Oral BID  . sodium chloride  3 mL Intravenous Q12H  . spironolactone  25 mg Oral Daily   Continuous Infusions:  PRN Meds:.acetaminophen, acetaminophen, albuterol, loperamide, ondansetron (ZOFRAN) IV, ondansetron, oxyCODONE   RADIOLOGY STUDIES/RESULTS: Dg Chest 2 View  02/21/2013   *RADIOLOGY REPORT*  Clinical Data: Altered mental status.  Fall.  CHEST - 2 VIEW  Comparison: 01/28/2012  Findings: The heart, mediastinum and hila are unremarkable.  The lungs are clear.  No pleural effusion or pneumothorax.  The bony thorax is demineralized but intact.  IMPRESSION: No acute cardiopulmonary disease.   Original Report Authenticated By: Amie Portland, M.D.   Ct Head Wo Contrast  02/21/2013   *RADIOLOGY REPORT*  Clinical Data: Fall, left side paralysis from prior stroke, history hypertension, seizure  CT HEAD WITHOUT CONTRAST  Technique:  Contiguous axial images were obtained from the base of the skull through the vertex without contrast.  Comparison: 01/28/2012  Findings: Generalized atrophy. Ex vacuo dilatation of the right lateral ventricle again identified secondary to large area of right MCA infarction involving the right parietal lobe and  basal ganglia. No midline shift or mass effect. Small vessel chronic ischemic changes of deep cerebral white matter. No intracranial hemorrhage or evidence of acute infarction. No extra-axial  fluid collections. Postsurgical changes of right frontoparietal craniotomy. Hyperdense nodule para falcine at the posterior right parietal region 11 x 9 mm consistent with meningioma, unchanged. Dense calcification within anterior falx. No new intracranial mass lesions.  IMPRESSION: Large old right MCA infarct. Mild small vessel chronic ischemic changes deep cerebral white matter. Stable right parafalcine meningioma posteriorly. Postsurgical changes right frontoparietal craniotomy. No new intracranial abnormalities.   Original Report Authenticated By: Ulyses Southward, M.D.   US Renal  02/21/2013   *RADIOLOGY REPORT*  Clinical Data: Acute renal failure  RENAL/URINARY TRACT ULTRASOUND COMPLETE  Comparison:  CT, 11/03/2010  Findings:  Right Kidney:  Right kidney is normal in overall size and echogenicity.  There is mild renal cortical thinning.  No renal masses or stones.  No hydronephrosis.  The right kidney measures 9.9 cm.  Left Kidney:  Left kidney is normal in overall size echogenicity. No left renal masses or stones or hydronephrosis.  Left kidney measures 10.2 cm.  Bladder:  Unremarkable  IMPRESSION: Mild right renal cortical thinning.  Exam is otherwise unremarkable.  No hydronephrosis.   Original Report Authenticated By: Amie Portland, M.D.   Pamella Pert, MD   [phone] If 7PM-7AM, please contact night-coverage [email or web address] Password [facility] 03/01/2013, 9:43 AM  LOS: 8 days

## 2013-03-02 ENCOUNTER — Inpatient Hospital Stay (HOSPITAL_COMMUNITY): Payer: Medicare Other

## 2013-03-02 LAB — CBC
Hemoglobin: 9.2 g/dL — ABNORMAL LOW (ref 12.0–15.0)
MCHC: 31.9 g/dL (ref 30.0–36.0)
Platelets: 335 10*3/uL (ref 150–400)
RBC: 3.71 MIL/uL — ABNORMAL LOW (ref 3.87–5.11)

## 2013-03-02 LAB — BASIC METABOLIC PANEL
CO2: 27 mEq/L (ref 19–32)
Calcium: 9 mg/dL (ref 8.4–10.5)
GFR calc non Af Amer: 55 mL/min — ABNORMAL LOW (ref >90)
Glucose, Bld: 84 mg/dL (ref 70–99)
Potassium: 3.2 mEq/L — ABNORMAL LOW (ref 3.5–5.1)
Sodium: 141 mEq/L (ref 135–145)

## 2013-03-02 MED ORDER — TECHNETIUM TC 99M SESTAMIBI GENERIC - CARDIOLITE
30.0000 | Freq: Once | INTRAVENOUS | Status: AC | PRN
  Administered 2013-03-02: 30 via INTRAVENOUS

## 2013-03-02 MED ORDER — POTASSIUM CHLORIDE CRYS ER 20 MEQ PO TBCR
40.0000 meq | EXTENDED_RELEASE_TABLET | Freq: Once | ORAL | Status: AC
  Administered 2013-03-02: 40 meq via ORAL

## 2013-03-02 MED ORDER — TECHNETIUM TC 99M SESTAMIBI GENERIC - CARDIOLITE
10.0000 | Freq: Once | INTRAVENOUS | Status: AC | PRN
  Administered 2013-03-02: 10 via INTRAVENOUS

## 2013-03-02 MED ORDER — REGADENOSON 0.4 MG/5ML IV SOLN
0.4000 mg | Freq: Once | INTRAVENOUS | Status: AC
  Administered 2013-03-02: 0.4 mg via INTRAVENOUS

## 2013-03-02 NOTE — Progress Notes (Signed)
Patient ID: Jordan Rivas  female  ZOX:[id number]    DOB: [birth year]    DOA: 02/21/2013  PCP: Lupita Raider, MD  Assessment/Plan: Principal Problem:  Sepsis associated hypotension  - Now improving, on prednisone 30 mg daily, taper down to 20 mg tomorrow - Urine culture shows pansensitive Escherichia coli- previously on Rocephin, transitioned to Zosyn to cover for pneumonia- will change to augmentin to complete course outpatient  - Blood cultures 6/21-negative   Acute Decompensated systolic CHF  - BNP significantly elevated on 6/23, mild congestive findings on chest x-ray as well. She did receive his 8-9 L of IV fluid boluses on admission..  - appreciate cardiology seeing: echo done 6/22 shows no wall abnormalities and EF 55% and echo done 6/24 shows diffuse hypokinesis and EF 40-45%  - added BB, on ACE, ASA and Aldactone, Lasix.  -Nuclear medicine stress test today  Leukoctosis  -steroid related, afebrile    UTI (lower urinary tract infection)/right-sided pyelonephritis  - Will continue oral antibiotics for 2 weeks outpatient  HCAP/aspiration pneumonia  - Chest x-ray on admission did not show pneumonia, however on 6/23 a repeat x-ray on the patient with SOB showed pneumonia.  - Given her history of a prior CVA and resultant hemiplegia and mild dysarthria, there is some suspicion for aspiration pneumonia, on oral Augmentin.   NSTEMI  ASA, nuclear med stress test today   Relative Adrenal Insufficiency  -cortisol inappropriately low normal in the setting of hypotension, on prednisone,  taper tomorrow   ARF (acute renal failure)  - patient had significant renal failure with a peak creatinine of 2.28 on admission. Renal failure resolved with hydration. This is likely prerenal in the setting of severe sepsis  -renal ultrasound-neg for hydronephrosis   Altered mental status  -resolved, likely secondary to toxic metabolite encephalopathy from narcotics and renal failure. CT of the head  is negative for acute abnormalities.   Acute urinary retention  - Removed Foley 6/23, no issues reported  Fall  - Likely secondary to hypotension  - CT of the head negative for acute abnormalities  - PT recs of home health  Diarrhea  - C. difficile PCR negative  -Start probiotics and Imodium prn-better today   HTN  - Controlled  Seizure  - Continue Keppra   . GERD (gastroesophageal reflux disease)  - Continue with PPI   Hypercholesteremia  -Continue statins   Chronic pain syndrome  - When necessary oxycodone  -resume Lyrica, Cymbalta and Elavil   DVT Prophylaxis:  Heparin   Code Status: FC  Disposition: Home health PT versus intermittent supervision/ SNF     Subjective: Patient seen earlier this morning, waiting for stress test today. No chest pain or shortness of breath  Objective: Weight change:   Intake/Output Summary (Last 24 hours) at 03/02/13 1331 Last data filed at 03/02/13 0900  Gross per 24 hour  Intake      0 ml  Output    200 ml  Net   -200 ml   Blood pressure 148/76, pulse 106, temperature 98.2 F (36.8 C), temperature source Oral, resp. rate 18, height 5\' 1"  (1.549 m), weight 80.4 kg (177 lb 4 oz), last menstrual period 09/30/2011, SpO2 97.00%.  Physical Exam: General: Alert and awake, oriented x3, not in any acute distress. CVS: S1-S2 clear, no murmur rubs or gallops Chest: clear to auscultation bilaterally, no wheezing, rales or rhonchi Abdomen: soft nontender, nondistended, normal bowel sounds  Extremities: no cyanosis, clubbing or edema noted bilaterally Neuro: Chronic left  hemiplegia-upper ext fingers with flexion contractures    Lab Results: Basic Metabolic Panel:  Recent Labs Lab 02/27/13 0815  03/01/13 0430 03/02/13 0658  NA 140  < > 142 141  K 2.9*  < > 3.1* 3.2*  CL 108  < > 107 103  CO2 23  < > 25 27  GLUCOSE 95  < > 107* 84  BUN 16  < > 11 13  CREATININE 1.06  < > 1.07 1.12*  CALCIUM 8.7  < > 9.0 9.0  MG 1.8  --    --   --   < > = values in this interval not displayed. Liver Function Tests: No results found for this basename: AST, ALT, ALKPHOS, BILITOT, PROT, ALBUMIN,  in the last 168 hours No results found for this basename: LIPASE, AMYLASE,  in the last 168 hours No results found for this basename: AMMONIA,  in the last 168 hours CBC:  Recent Labs Lab 03/01/13 0430 03/02/13 0658  WBC 14.9* 16.7*  HGB 8.9* 9.2*  HCT 27.6* 28.8*  MCV 78.0 77.6*  PLT 324 335   Cardiac Enzymes:  Recent Labs Lab 02/25/13 0940 02/27/13 0815  TROPONINI 0.33* <0.30   BNP: No components found with this basename: POCBNP,  CBG: No results found for this basename: GLUCAP,  in the last 168 hours   Micro Results: Recent Results (from the past 240 hour(s))  URINE CULTURE     Status: None   Collection Time    02/21/13  3:47 AM      Result Value Range Status   Specimen Description URINE, CATHETERIZED   Final   Special Requests CX ADDED AT 0408 ON [id number]   Final   Culture  Setup Time 02/21/2013 15:24   Final   Colony Count >=100,000 COLONIES/ML   Final   Culture ESCHERICHIA COLI   Final   Report Status 02/23/2013 FINAL   Final   Organism ID, Bacteria ESCHERICHIA COLI   Final  CULTURE, BLOOD (ROUTINE X 2)     Status: None   Collection Time    02/21/13  6:53 AM      Result Value Range Status   Specimen Description BLOOD RIGHT HAND   Final   Special Requests BOTTLES DRAWN AEROBIC AND ANAEROBIC 5CC   Final   Culture  Setup Time 02/21/2013 15:12   Final   Culture NO GROWTH 5 DAYS   Final   Report Status 02/27/2013 FINAL   Final  CULTURE, BLOOD (ROUTINE X 2)     Status: None   Collection Time    02/21/13 10:10 AM      Result Value Range Status   Specimen Description BLOOD LEFT HAND   Final   Special Requests BOTTLES DRAWN AEROBIC ONLY 5CC   Final   Culture  Setup Time 02/21/2013 15:10   Final   Culture NO GROWTH 5 DAYS   Final   Report Status 02/27/2013 FINAL   Final  MRSA PCR SCREENING     Status: None    Collection Time    02/21/13 10:37 AM      Result Value Range Status   MRSA by PCR NEGATIVE  NEGATIVE Final   Comment:            The GeneXpert MRSA Assay (FDA     approved for NASAL specimens     only), is one component of a     comprehensive MRSA colonization     surveillance program. It is not  intended to diagnose MRSA     infection nor to guide or     monitor treatment for     MRSA infections.  CLOSTRIDIUM DIFFICILE BY PCR     Status: None   Collection Time    02/23/13  8:48 AM      Result Value Range Status   C difficile by pcr NEGATIVE  NEGATIVE Final    Studies/Results: Dg Chest 2 View  02/26/2013   *RADIOLOGY REPORT*  Clinical Data: Congestive heart failure.  Pneumonia.  CHEST - 2 VIEW  Comparison: 06/23 and 02/21/2013 and 01/28/2012  Findings: There has been  improvement in the hazy infiltrates bilaterally.  There is a new small right perihilar infiltrate and there is a focal area of consolidation at the left lung base.  The patient has new bilateral pleural effusions.  The heart size and pulmonary vascularity remain normal.  No acute osseous abnormality.  IMPRESSION:  Improving bibasilar infiltrates although there is a new small right perihilar infiltrate and there is consolidation at the left lung base.  New small bilateral pleural effusions.  Findings are consistent with pneumonia with parapneumonic effusions.   Original Report Authenticated By: Francene Boyers, M.D.   Dg Chest 2 View  02/21/2013   *RADIOLOGY REPORT*  Clinical Data: Altered mental status.  Fall.  CHEST - 2 VIEW  Comparison: 01/28/2012  Findings: The heart, mediastinum and hila are unremarkable.  The lungs are clear.  No pleural effusion or pneumothorax.  The bony thorax is demineralized but intact.  IMPRESSION: No acute cardiopulmonary disease.   Original Report Authenticated By: Amie Portland, M.D.   Ct Head Wo Contrast  02/21/2013   *RADIOLOGY REPORT*  Clinical Data: Fall, left side paralysis from prior  stroke, history hypertension, seizure  CT HEAD WITHOUT CONTRAST  Technique:  Contiguous axial images were obtained from the base of the skull through the vertex without contrast.  Comparison: 01/28/2012  Findings: Generalized atrophy. Ex vacuo dilatation of the right lateral ventricle again identified secondary to large area of right MCA infarction involving the right parietal lobe and basal ganglia. No midline shift or mass effect. Small vessel chronic ischemic changes of deep cerebral white matter. No intracranial hemorrhage or evidence of acute infarction. No extra-axial fluid collections. Postsurgical changes of right frontoparietal craniotomy. Hyperdense nodule para falcine at the posterior right parietal region 11 x 9 mm consistent with meningioma, unchanged. Dense calcification within anterior falx. No new intracranial mass lesions.  IMPRESSION: Large old right MCA infarct. Mild small vessel chronic ischemic changes deep cerebral white matter. Stable right parafalcine meningioma posteriorly. Postsurgical changes right frontoparietal craniotomy. No new intracranial abnormalities.   Original Report Authenticated By: Ulyses Southward, M.D.   US Renal  02/21/2013   *RADIOLOGY REPORT*  Clinical Data: Acute renal failure  RENAL/URINARY TRACT ULTRASOUND COMPLETE  Comparison:  CT, 11/03/2010  Findings:  Right Kidney:  Right kidney is normal in overall size and echogenicity.  There is mild renal cortical thinning.  No renal masses or stones.  No hydronephrosis.  The right kidney measures 9.9 cm.  Left Kidney:  Left kidney is normal in overall size echogenicity. No left renal masses or stones or hydronephrosis.  Left kidney measures 10.2 cm.  Bladder:  Unremarkable  IMPRESSION: Mild right renal cortical thinning.  Exam is otherwise unremarkable.  No hydronephrosis.   Original Report Authenticated By: Amie Portland, M.D.   Dg Chest Port 1 View  02/23/2013   *RADIOLOGY REPORT*  Clinical Data: Short of breath.  Chest  pain.   PORTABLE CHEST - 1 VIEW  Comparison: 02/21/2013  Findings: Artifact overlies the chest.  The patient has developed bronchopneumonia in both lower lobes worse on the left than the right.  There may be small amount of pleural fluid on the left. Heart size is normal.  Mediastinal shadows are normal.  IMPRESSION: Lower lobe pneumonia worse on the left than the right.   Original Report Authenticated By: Paulina Fusi, M.D.    Medications: Scheduled Meds: . amitriptyline  75 mg Oral QHS  . amoxicillin-clavulanate  1 tablet Oral Q12H  . aspirin  81 mg Oral Daily  . atorvastatin  20 mg Oral QPM  . DULoxetine  60 mg Oral Daily  . feeding supplement  1 Container Oral TID BM  . furosemide  20 mg Oral Daily  . heparin  5,000 Units Subcutaneous Q8H  . irbesartan  75 mg Oral Daily  . levETIRAcetam  500 mg Oral BID  . metoprolol succinate  25 mg Oral Daily  . oxybutynin  10 mg Oral BID  . pantoprazole  40 mg Oral BID  . potassium chloride  40 mEq Oral Daily  . predniSONE  30 mg Oral Q breakfast  . pregabalin  75 mg Oral BID  . saccharomyces boulardii  250 mg Oral BID  . sodium chloride  3 mL Intravenous Q12H  . spironolactone  25 mg Oral Daily      LOS: 9 days   Diavian Furgason M.D. [facility] 03/02/2013, 1:31 PM Pager: [phone]  If 7PM-7AM, please contact night-coverage [email or web address] Password [email or web address]

## 2013-03-02 NOTE — Progress Notes (Signed)
First visit with pt as per charge nurse's referral. I introduced myself and my role to the pt. Pt's son was present in the room with the pt when I visited with them. Pt's son was helping the pt determine if the pt had a food tray ready for her. Pt stated that she had just returned from a procedure and had not eaten all day. I encouraged the pt to use the call button to request for a food tray. Pt requested for a food tray. Pt indicated that she would like for me to return tomorrow to check in on her. I plan to follow up with a visit tomorrow.   Sherol Dade Counselor Intern Haroldine Laws

## 2013-03-02 NOTE — Progress Notes (Signed)
Subjective: No CP or SOB.  Objective: Vital signs in last 24 hours: Temp:  [98 F (36.7 C)-98.9 F (37.2 C)] 98.6 F (37 C) (06/30 0554) Pulse Rate:  [73-87] 73 (06/30 0554) Resp:  [16-18] 18 (06/30 0554) BP: (137-152)/(66-87) 141/87 mmHg (06/30 0554) SpO2:  [93 %-98 %] 93 % (06/30 0554) Last BM Date: 02/28/13  Intake/Output from previous day: 06/29 0701 - 06/30 0700 In: 360 [P.O.:360] Out: 600 [Urine:600] Intake/Output this shift:    Medications Current Facility-Administered Medications  Medication Dose Route Frequency Provider Last Rate Last Dose  . acetaminophen (TYLENOL) tablet 650 mg  650 mg Oral Q6H PRN Maretta Bees, MD   650 mg at 02/21/13 1106   Or  . acetaminophen (TYLENOL) suppository 650 mg  650 mg Rectal Q6H PRN Maretta Bees, MD      . albuterol (PROVENTIL) (5 MG/ML) 0.5% nebulizer solution 2.5 mg  2.5 mg Nebulization Q2H PRN Maretta Bees, MD   2.5 mg at 02/25/13 0002  . amitriptyline (ELAVIL) tablet 75 mg  75 mg Oral QHS Joseph Art, DO   75 mg at 03/01/13 2130  . amoxicillin-clavulanate (AUGMENTIN) 875-125 MG per tablet 1 tablet  1 tablet Oral Q12H Joseph Art, DO   1 tablet at 03/01/13 2131  . aspirin chewable tablet 81 mg  81 mg Oral Daily Chrystie Nose, MD   81 mg at 03/01/13 1047  . atorvastatin (LIPITOR) tablet 20 mg  20 mg Oral QPM Maretta Bees, MD   20 mg at 03/01/13 1717  . DULoxetine (CYMBALTA) DR capsule 60 mg  60 mg Oral Daily Maretta Bees, MD   60 mg at 03/01/13 1050  . feeding supplement (ENSURE) pudding 1 Container  1 Container Oral TID BM Maretta Bees, MD   1 Container at 03/01/13 2131  . furosemide (LASIX) tablet 20 mg  20 mg Oral Daily Chrystie Nose, MD   20 mg at 03/01/13 1050  . heparin injection 5,000 Units  5,000 Units Subcutaneous Q8H Maretta Bees, MD   5,000 Units at 03/02/13 0601  . irbesartan (AVAPRO) tablet 75 mg  75 mg Oral Daily Marykay Lex, MD   75 mg at 03/01/13 1050  .  levETIRAcetam (KEPPRA) tablet 500 mg  500 mg Oral BID Maretta Bees, MD   500 mg at 03/01/13 2131  . loperamide (IMODIUM) capsule 2 mg  2 mg Oral PRN Maretta Bees, MD   2 mg at 02/28/13 2059  . metoprolol succinate (TOPROL-XL) 24 hr tablet 25 mg  25 mg Oral Daily Chrystie Nose, MD   25 mg at 03/01/13 1058  . ondansetron (ZOFRAN) tablet 4 mg  4 mg Oral Q6H PRN Maretta Bees, MD   4 mg at 02/24/13 1034   Or  . ondansetron [facility]) injection 4 mg  4 mg Intravenous Q6H PRN Maretta Bees, MD   4 mg at 02/22/13 2045  . oxybutynin (DITROPAN-XL) 24 hr tablet 10 mg  10 mg Oral BID Maretta Bees, MD   10 mg at 03/01/13 2131  . oxyCODONE (Oxy IR/ROXICODONE) immediate release tablet 5 mg  5 mg Oral Q6H PRN Maretta Bees, MD   5 mg at 03/01/13 1716  . pantoprazole (PROTONIX) EC tablet 40 mg  40 mg Oral BID Maretta Bees, MD   40 mg at 03/01/13 2132  . potassium chloride SA (K-DUR,KLOR-CON) CR tablet 40 mEq  40 mEq Oral  Daily Chrystie Nose, MD   40 mEq at 03/01/13 1057  . predniSONE (DELTASONE) tablet 30 mg  30 mg Oral Q breakfast Joseph Art, DO   30 mg at 03/01/13 1054  . pregabalin (LYRICA) capsule 75 mg  75 mg Oral BID Maretta Bees, MD   75 mg at 03/01/13 2132  . saccharomyces boulardii (FLORASTOR) capsule 250 mg  250 mg Oral BID Maretta Bees, MD   250 mg at 03/01/13 2131  . sodium chloride 0.9 % injection 3 mL  3 mL Intravenous Q12H Maretta Bees, MD   3 mL at 03/01/13 2132  . spironolactone (ALDACTONE) tablet 25 mg  25 mg Oral Daily Chrystie Nose, MD   25 mg at 03/01/13 1050    PE: General appearance: alert, cooperative and no distress  Lungs: CTA. No Wheeze.  Heart: regular rate and rhythm, S1, S2 normal, no murmur, click, rub or gallop  Extremities: No LEE  Pulses: 2+ right DP PT and radials..  Decreased left radial and pedal pulses.  All ext warm. Neurologic: Left hemiplegia    Lab Results:   Recent Labs  03/01/13 0430 03/02/13 0658    WBC 14.9* 16.7*  HGB 8.9* 9.2*  HCT 27.6* 28.8*  PLT 324 335   BMET  Recent Labs  02/27/13 2109 02/28/13 0540 03/01/13 0430  NA 142 144 142  K 4.3 3.9 3.1*  CL 109 112 107  CO2 23 25 25   GLUCOSE 112* 93 107*  BUN 15 13 11   CREATININE 1.00 1.02 1.07  CALCIUM 8.9 9.0 9.0    Assessment/Plan  Principal Problem:   Sepsis associated hypotension Active Problems:   Seizure   Hypercholesteremia   GERD (gastroesophageal reflux disease)   UTI (lower urinary tract infection)   ARF (acute renal failure)   Fall   Altered mental status   Severe sepsis   Acute combined systolic and diastolic heart failure   NSTEMI (non-ST elevated myocardial infarction)  Plan:  Lexiscan myoview today.  She tolerated the test very well.  Radiologist interpretation to follow.   LOS: 9 days    HAGER, BRYAN 03/02/2013 8:32 AM   Agree with note written by Jones Skene PAC  Myoview low risk. Cardiac enz neg. BNP elevated and 2 D shows mild to mod LV dysfunction possibly related to sepsis. Denies CP/SOB. No further w/u necessary. Will require repeat 2 D echo in 3 months. Probably will require a low dose diuretic at D/C/ We will folow up in our office.   Runell Gess 03/02/2013 3:06 PM

## 2013-03-02 NOTE — Progress Notes (Signed)
Physical Therapy Treatment Patient Details Name: Jordan Rivas MRN: [id number] DOB: [birth year] Today's Date: 03/02/2013 Time: 4098-1191 PT Time Calculation (min): 23 min  PT Assessment / Plan / Recommendation  PT Comments   Pt making steady progress.  Follow Up Recommendations  Home health PT;Supervision - Intermittent (if pt agreeable to [facility])     Does the patient have the potential to tolerate intense rehabilitation     Barriers to Discharge        Equipment Recommendations  None recommended by PT    Recommendations for Other Services    Frequency Min 3X/week   Progress towards PT Goals Progress towards PT goals: Progressing toward goals  Plan Current plan remains appropriate    Precautions / Restrictions Precautions Precautions: Fall   Pertinent Vitals/Pain N/A    Mobility  Bed Mobility Bed Mobility: Supine to Sit Supine to Sit: 6: Modified independent (Device/Increase time);With rails;HOB elevated Sitting - Scoot to Edge of Bed: 6: Modified independent (Device/Increase time) Transfers Sit to Stand: 6: Modified independent (Device/Increase time);With upper extremity assist;From bed;From toilet Stand to Sit: 6: Modified independent (Device/Increase time);With upper extremity assist;To bed;To toilet Ambulation/Gait Ambulation/Gait Assistance: 5: Supervision Ambulation Distance (Feet): 150 Feet Assistive device: Straight cane Gait Pattern: Step-to pattern;Step-through pattern;Decreased step length - left;Decreased stance time - left;Decreased stride length;Decreased hip/knee flexion - left;Decreased dorsiflexion - left;Decreased weight shift to left Gait velocity: decreased    Exercises     PT Diagnosis:    PT Problem List:   PT Treatment Interventions:     PT Goals (current goals can now be found in the care plan section)    Visit Information  Last PT Received On: 03/02/13    Subjective Data      Cognition  Cognition Arousal/Alertness:  Awake/alert Overall Cognitive Status: Within Functional Limits for tasks assessed    Balance  Static Standing Balance Static Standing - Balance Support: Right upper extremity supported Static Standing - Level of Assistance: 6: Modified independent (Device/Increase time)  End of Session PT - End of Session Equipment Utilized During Treatment: Gait belt Activity Tolerance: Patient tolerated treatment well Patient left: in bed;with call bell/phone within reach;with family/visitor present Nurse Communication: Mobility status   GP     Jordan Rivas 03/02/2013, 4:20 PM  [employer] PT [phone]

## 2013-03-03 LAB — CBC
Hemoglobin: 10.5 g/dL — ABNORMAL LOW (ref 12.0–15.0)
MCH: 25.4 pg — ABNORMAL LOW (ref 26.0–34.0)
MCV: 78 fL (ref 78.0–100.0)
Platelets: 377 10*3/uL (ref 150–400)
RBC: 4.13 MIL/uL (ref 3.87–5.11)

## 2013-03-03 LAB — BASIC METABOLIC PANEL
CO2: 23 mEq/L (ref 19–32)
Calcium: 9.3 mg/dL (ref 8.4–10.5)
Glucose, Bld: 111 mg/dL — ABNORMAL HIGH (ref 70–99)
Sodium: 138 mEq/L (ref 135–145)

## 2013-03-03 MED ORDER — METOPROLOL SUCCINATE ER 25 MG PO TB24: 25.0000 mg | ORAL_TABLET | Freq: Every day | ORAL | Status: AC

## 2013-03-03 MED ORDER — PREDNISONE (PAK) 10 MG PO TABS: ORAL_TABLET | ORAL | Status: DC

## 2013-03-03 MED ORDER — SPIRONOLACTONE 25 MG PO TABS: 25.0000 mg | ORAL_TABLET | Freq: Every day | ORAL | Status: DC

## 2013-03-03 MED ORDER — ASPIRIN 81 MG PO CHEW: 81.0000 mg | CHEWABLE_TABLET | Freq: Every day | ORAL | Status: AC

## 2013-03-03 MED ORDER — AMOXICILLIN-POT CLAVULANATE 875-125 MG PO TABS: 1.0000 | ORAL_TABLET | Freq: Two times a day (BID) | ORAL | Status: DC

## 2013-03-03 MED ORDER — POTASSIUM CHLORIDE CRYS ER 20 MEQ PO TBCR: 40.0000 meq | EXTENDED_RELEASE_TABLET | Freq: Every day | ORAL | Status: DC

## 2013-03-03 MED ORDER — FUROSEMIDE 20 MG PO TABS: 20.0000 mg | ORAL_TABLET | Freq: Every day | ORAL | Status: DC

## 2013-03-03 MED ORDER — IRBESARTAN 75 MG PO TABS: 75.0000 mg | ORAL_TABLET | Freq: Every day | ORAL | Status: DC

## 2013-03-03 NOTE — Care Management Note (Addendum)
   CARE MANAGEMENT NOTE 03/03/2013  Patient:  Jordan Rivas, Jordan Rivas   Account Number:  [id number]  Date Initiated:  02/23/2013  Documentation initiated by:  Johny Shock  Subjective/Objective Assessment:   referral for HHPT     Action/Plan:   6/23 Met with pt who currently does not think that she needs HHPT, however will revisit  closer to time of d/c. Pt has Personal Care Services with [facility]. 3 hr per day / 5 days per week.   Anticipated DC Date:  03/03/2013   Anticipated DC Plan:  HOME W HOME HEALTH SERVICES         Choice offered to / List presented to:          [facility] arranged  HH-8 PCS/PERSONAL CARE SERVICES      HH agency  [facility]   Status of service:  Completed, signed off Medicare Important Message given?   (If response is "NO", the following Medicare IM given date fields will be blank) Date Medicare IM given:   Date Additional Medicare IM given:    Discharge Disposition:  HOME W HOME HEALTH SERVICES  Per UR Regulation:    If discussed at Long Length of Stay Meetings, dates discussed:    Comments:  03/03/2013 Spoke with [facility] and faxed pt d/c summary.  CRoyal RN MPH  03/03/2013 Spoke with MD and with pt and offered HHPT services however this pt declined these services. Will notify [facility] of plan to d/c. Johny Shock RN MPH Case Manager [phone]  6.23.2014 Pt currently does not think that she needs HHPT, will follow for changes. Pt has PCS with [facility], 3hr/day, 5d/wk. Johny Shock RN MPH Case Manager [phone]

## 2013-03-03 NOTE — Discharge Summary (Signed)
Physician Discharge Summary  Patient ID: Jordan Rivas MRN: [id number] DOB/AGE: Dec 12, 1960 52 y.o.  Admit date: 02/21/2013 Discharge date: 03/03/2013  Primary Care Physician:  Lupita Raider, MD  Discharge Diagnoses:    . Sepsis associated hypotension with relative adrenal insufficiency . UTI (lower urinary tract infection) with right-sided pyelonephritis . ARF (acute renal failure) . Seizure . GERD (gastroesophageal reflux disease) . Hypercholesteremia . Altered mental status- resolved . HCAP/ possible aspiration pneumonia . Acute combined systolic and diastolic heart failure Chronic pain syndrome  Consults:  [facility] cardiology                     Critical care, Dr. Craige Cotta  Recommendations for Outpatient Follow-up:  1) the patient had relative adrenal insufficiency likely secondary to sepsis and UTI. She is on prednisone taper, please follow cortisone level again once off prednisone 2) please check CBC and BMET in a week or at followup appointment  Allergies:   Allergies  Allergen Reactions  . Celebrex (Celecoxib) Swelling    "lips; it was terrible"  . Baclofen Swelling     Discharge Medications:   Medication List    STOP taking these medications       olmesartan 20 MG tablet  Commonly known as:  BENICAR     verapamil 240 MG CR tablet  Commonly known as:  CALAN-SR      TAKE these medications       amitriptyline 25 MG tablet  Commonly known as:  ELAVIL  Take 75 mg by mouth at bedtime.     amoxicillin-clavulanate 875-125 MG per tablet  Commonly known as:  AUGMENTIN  Take 1 tablet by mouth 2 (two) times daily. x5 days     aspirin 81 MG chewable tablet  Chew 1 tablet (81 mg total) by mouth daily.     atorvastatin 20 MG tablet  Commonly known as:  LIPITOR  Take 20 mg by mouth daily.     DULoxetine 60 MG capsule  Commonly known as:  CYMBALTA  Take 60 mg by mouth daily.     esomeprazole 40 MG capsule  Commonly known as:  NEXIUM  Take 40 mg by mouth  2 (two) times daily.     furosemide 20 MG tablet  Commonly known as:  LASIX  Take 1 tablet (20 mg total) by mouth daily.     irbesartan 75 MG tablet  Commonly known as:  AVAPRO  Take 1 tablet (75 mg total) by mouth daily.     levETIRAcetam 500 MG tablet  Commonly known as:  KEPPRA  Take 1 tablet (500 mg total) by mouth 2 (two) times daily.     metoprolol succinate 25 MG 24 hr tablet  Commonly known as:  TOPROL-XL  Take 1 tablet (25 mg total) by mouth daily.     oxybutynin 10 MG 24 hr tablet  Commonly known as:  DITROPAN-XL  Take 10 mg by mouth 2 (two) times daily.     oxyCODONE 5 MG immediate release tablet  Commonly known as:  Oxy IR/ROXICODONE  Take 5 mg by mouth every 8 (eight) hours as needed. For pain     potassium chloride SA 20 MEQ tablet  Commonly known as:  K-DUR,KLOR-CON  Take 2 tablets (40 mEq total) by mouth daily.     predniSONE 10 MG tablet  Commonly known as:  STERAPRED UNI-PAK  - Prednisone dosing: Take  Prednisone 20mg  (2 tabs) x 3days, then 10mg  (1 tab) x 3 days, then prednisone 5  mg (1/2 tab) x 3 days, then OFF.  -   - Dispense: 12  tabs, refills: None     pregabalin 75 MG capsule  Commonly known as:  LYRICA  Take 75 mg by mouth 2 (two) times daily.     spironolactone 25 MG tablet  Commonly known as:  ALDACTONE  Take 1 tablet (25 mg total) by mouth daily.         Brief H and P: For complete details please refer to admission H and P, but in briefJoyce A Rivas is a 52 y.o. 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 The patient reported fall approximately 3 times a day prior to admission, she denied losing consciousness.Unfortunately he to the falls were unwitnessed, however there is a history of urinary incontinence or tongue bite. When she fell the third time around 11:00 PM  the night before admission, she was brought to the emergency room, she was also noted to be lethargic and with some mild altered mental status. She is on chronic narcotics and Neurontin for pain. In the ED she was found to be slightly somnolent and arousable, pupils were found to be dilated and minimally reactive, she was also initially found to be hypotensive. She was given Narcan without significant improvement. A CT of the head was negative for acute pathology, she was found to have mild leukocytosis and a urinalysis somewhat consistent with a UTI. She was given 2 L of IV fluids bolus, with some improvement of the blood pressure to the low 100s systolic-90s systolic   Hospital Course:  Patient is a 52 year old African American female with a past medical history of intracranial hemorrhage approximately 18 years ago with resultant left-sided hemiplegia, admitted to the step down unit following frequent falls, hypertension and a UTI. Patient required multiple units of IV fluid boluses, and stress dose steroids, along with antibiotics. When she was stabilized she was moved to the regular medical surgical unit. She was doing well, urine cultures are positive for pansensitive Escherichia coli. However on 6/23 she developed slight shortness of breath and wheezing, x-ray of the chest done on 6/23 was also by pneumonia. Her BNP was quite elevated at 30,585 on 02/27/13. It is thought that this probably a combination of pneumonia and possible CHF decompensation. She is being tapered off her hydrocortisone that was started for relative adrenal insufficiency in the setting of hypotension. Cardiology was also consulted. Sepsis associated hypotension:  Now improving, on prednisone 30 mg daily, taper down to 20 mg tomorrow then wean off slowly. Urine culture shows pansensitive Escherichia coli- previously on Rocephin, transitioned to Zosyn to cover for pneumonia. Changed to augmentin to complete course outpatient treatment.  Blood cultures 6/21 remained negative.    Acute Decompensated systolic CHF with elevated troponin 0.33 - BNP significantly elevated on 6/27 at 30,285,. mild congestive findings on chest x-ray as well. She did receive 8-9 L of IV fluid boluses on admission. Cardiology was consulted. She underwent 2-D echocardiogram shows no wall abnormalities and EF 55% however echo done 6/24 showed diffuse hypokinesis and EF 40-45%. Cardiology added BB, on ACE, ASA and Aldactone, Lasix. Patient underwent nuclear medicine stress test on 03/02/2013 which showed Mild attenuation involving the anterior wall extending towards the left ventricular apex, no definitive evidence of prior infarction or pharmacologically induced ischemia, global hypokinesia, EF of 43%  Leukoctosis -  steroid related, afebrile   UTI (lower urinary tract infection)/right-sided pyelonephritis  - Will continue oral antibiotics for total of 2 weeks.    HCAP/aspiration pneumonia  - Chest x-ray on admission did not show pneumonia, however on 6/23 a repeat x-ray on the patient with SOB showed pneumonia. Given her history of a prior CVA and resultant hemiplegia and mild dysarthria, there is some suspicion for aspiration pneumonia, hence on oral Augmentin.   Relative Adrenal Insufficiency  -cortisol inappropriately low normal in the setting of hypotension, on prednisone, taper tomorrow and wean off slowly. Please check ACTH stim test, cortisol level outpatient after being off prednisone.  ARF (acute renal failure)  - patient had significant renal failure with a peak creatinine of 2.28 on admission. Renal failure resolved with hydration. This is likely prerenal in the setting of severe sepsis. Renal ultrasound was negative for hydronephrosis  Altered mental status : resolved, likely secondary to toxic metabolite encephalopathy from narcotics and renal failure. CT of the head is negative for acute abnormalities.   Acute urinary retention:  Removed Foley  6/23, no issues reported   Fall  - Likely secondary to hypotension  - CT of the head negative for acute abnormalities  - PT recs of home health however patient did not want any home health resources  Diarrhea, Improved C. difficile PCR negative   HTN - Controlled   Seizure - Continue Keppra   . GERD (gastroesophageal reflux disease) - Continue with PPI   Hypercholesteremia  -Continue statins   Chronic pain syndrome continue PRN oxycodone, resumed Lyrica, Cymbalta and Elavil     Day of Discharge BP 128/73  Pulse 78  Temp(Src) 98.6 F (37 C) (Oral)  Resp 18  Ht 5\' 1"  (1.549 m)  Wt 76.6 kg (168 lb 14 oz)  BMI 31.92 kg/m2  SpO2 95%  LMP 09/30/2011  Physical Exam:  General: Alert and awake, oriented x3, not in any acute distress.  CVS: S1-S2 clear, no murmur rubs or gallops  Chest: clear to auscultation bilaterally, no wheezing, rales or rhonchi  Abdomen: soft nontender, nondistended, normal bowel sounds  Extremities: no cyanosis, clubbing or edema noted bilaterally  Neuro: Chronic left hemiplegia-upper ext fingers with flexion contractures   The results of significant diagnostics from this hospitalization (including imaging, microbiology, ancillary and laboratory) are listed below for reference.    LAB RESULTS: Basic Metabolic Panel:  Recent Labs Lab 02/27/13 0815  03/01/13 0430 03/02/13 0658  NA 140  < > 142 141  K 2.9*  < > 3.1* 3.2*  CL 108  < > 107 103  CO2 23  < > 25 27  GLUCOSE 95  < > 107* 84  BUN 16  < > 11 13  CREATININE 1.06  < > 1.07 1.12*  CALCIUM 8.7  < > 9.0 9.0  MG 1.8  --   --   --   < > = values in this interval not displayed.  CBC:  Recent Labs Lab 03/02/13 0658 03/03/13 1050  WBC 16.7* 19.0*  HGB 9.2* 10.5*  HCT 28.8* 32.2*  MCV 77.6* 78.0  PLT 335 377   Cardiac Enzymes:  Recent Labs Lab 02/25/13 0940 02/27/13 0815  TROPONINI 0.33* <0.30   BNP: No components found with this basename: POCBNP,  CBG: No results found  for this basename: GLUCAP,  in the last 168 hours  Significant Diagnostic Studies:  Dg Chest 2 View  02/21/2013   *RADIOLOGY REPORT*  Clinical Data: Altered mental status.  Fall.  CHEST - 2 VIEW  Comparison: 01/28/2012  Findings: The heart, mediastinum and hila are unremarkable.  The lungs are clear.  No pleural effusion or pneumothorax.  The bony thorax is demineralized but intact.  IMPRESSION: No acute cardiopulmonary disease.   Original Report Authenticated By: Amie Portland, M.D.   Ct Head Wo Contrast  02/21/2013   *RADIOLOGY REPORT*  Clinical Data: Fall, left side paralysis from prior stroke, history hypertension, seizure  CT HEAD WITHOUT CONTRAST  Technique:  Contiguous axial images were obtained from the base of the skull through the vertex without contrast.  Comparison: 01/28/2012  Findings: Generalized atrophy. Ex vacuo dilatation of the right lateral ventricle again identified secondary to large area of right MCA infarction involving the right parietal lobe and basal ganglia. No midline shift or mass effect. Small vessel chronic ischemic changes of deep cerebral white matter. No intracranial hemorrhage or evidence of acute infarction. No extra-axial fluid collections. Postsurgical changes of right frontoparietal craniotomy. Hyperdense nodule para falcine at the posterior right parietal region 11 x 9 mm consistent with meningioma, unchanged. Dense calcification within anterior falx. No new intracranial mass lesions.  IMPRESSION: Large old right MCA infarct. Mild small vessel chronic ischemic changes deep cerebral white matter. Stable right parafalcine meningioma posteriorly. Postsurgical changes right frontoparietal craniotomy. No new intracranial abnormalities.   Original Report Authenticated By: Ulyses Southward, M.D.   US Renal  02/21/2013   *RADIOLOGY REPORT*  Clinical Data: Acute renal failure  RENAL/URINARY TRACT ULTRASOUND COMPLETE  Comparison:  CT, 11/03/2010  Findings:  Right Kidney:  Right  kidney is normal in overall size and echogenicity.  There is mild renal cortical thinning.  No renal masses or stones.  No hydronephrosis.  The right kidney measures 9.9 cm.  Left Kidney:  Left kidney is normal in overall size echogenicity. No left renal masses or stones or hydronephrosis.  Left kidney measures 10.2 cm.  Bladder:  Unremarkable  IMPRESSION: Mild right renal cortical thinning.  Exam is otherwise unremarkable.  No hydronephrosis.   Original Report Authenticated By: Amie Portland, M.D.    Nuclear medicine stress test 03/02/13 IMPRESSION:  1. Mild attenuation involving the anterior wall extending towards  the left ventricular apex. No definitive scintigraphic evidence of  prior infarction or pharmacologically induced ischemia.  2. Global hypokinesia. Ejection fraction - 43%.   Disposition and Follow-up:     Discharge Orders   Future Appointments Provider Department Dept Phone   03/13/2013 2:00 PM Abelino Derrick, [place] [facility] [place] [phone]   Future Orders Complete By Expires     Diet - low sodium heart healthy  As directed     Increase activity slowly  As directed         DISPOSITION: home DIET: heart healthy diet ACTIVITY:as tolerated   DISCHARGE FOLLOW-UP Follow-up Information   Follow up with Abelino Derrick, PA-C On 03/13/2013. (2:00 pm)    Contact information:   [address] [place] [address] [phone]       Follow up with SHAW,KIMBERLEE, MD. Schedule an appointment as soon as possible for a visit in 10 days. (please check BMET, CBC at the appt)    Contact information:   [address] [place] [address] [phone]       Time spent on Discharge: 35 mins  Signed:   Gicela Schwarting M.D. [facility] 03/03/2013, 12:14 PM Pager: [phone]

## 2013-03-03 NOTE — Progress Notes (Signed)
Discharge instructions reviewed with patient. Verbalized understanding to follow-up with MD in 2 days. Medications were reviewed. Telemetry was discontinued. Saline Lock was removed, catheter intact. Son helped her to dress. No further complaints were expressed. Patient felt she had improved and was near her baseline. Patient discharged to: Home Accompanied by: Son. Escorted to hospital exit via wheelchair. Japleen Tornow K

## 2013-03-03 NOTE — Progress Notes (Signed)
DAILY PROGRESS NOTE  Subjective:  No events noted overnight. Nuclear stress test yesterday was non-ischemic. EF 43% - consistent with echo findings of global hypokinesis and EF 40-45%.  Objective:  Temp:  [98.2 F (36.8 C)-98.6 F (37 C)] 98.6 F (37 C) (07/01 0500) Pulse Rate:  [78-109] 78 (07/01 0500) Resp:  [18-19] 18 (07/01 0500) BP: (116-160)/(70-80) 128/73 mmHg (07/01 0500) SpO2:  [95 %-97 %] 95 % (07/01 0500) Weight:  [168 lb 14 oz (76.6 kg)] 168 lb 14 oz (76.6 kg) (06/30 2152) Weight change:   Intake/Output from previous day: 06/30 0701 - 07/01 0700 In: 120 [P.O.:120] Out: 650 [Urine:650]  Intake/Output from this shift:    Medications: Current Facility-Administered Medications  Medication Dose Route Frequency Provider Last Rate Last Dose  . acetaminophen (TYLENOL) tablet 650 mg  650 mg Oral Q6H PRN Maretta Bees, MD   650 mg at 02/21/13 1106   Or  . acetaminophen (TYLENOL) suppository 650 mg  650 mg Rectal Q6H PRN Maretta Bees, MD      . albuterol (PROVENTIL) (5 MG/ML) 0.5% nebulizer solution 2.5 mg  2.5 mg Nebulization Q2H PRN Maretta Bees, MD   2.5 mg at 02/25/13 0002  . amitriptyline (ELAVIL) tablet 75 mg  75 mg Oral QHS Joseph Art, DO   75 mg at 03/02/13 2143  . amoxicillin-clavulanate (AUGMENTIN) 875-125 MG per tablet 1 tablet  1 tablet Oral Q12H Joseph Art, DO   1 tablet at 03/03/13 0958  . aspirin chewable tablet 81 mg  81 mg Oral Daily Chrystie Nose, MD   81 mg at 03/02/13 1557  . atorvastatin (LIPITOR) tablet 20 mg  20 mg Oral QPM Maretta Bees, MD   20 mg at 03/02/13 2003  . DULoxetine (CYMBALTA) DR capsule 60 mg  60 mg Oral Daily Maretta Bees, MD   60 mg at 03/03/13 0958  . feeding supplement (ENSURE) pudding 1 Container  1 Container Oral TID BM Maretta Bees, MD   1 Container at 03/01/13 2131  . furosemide (LASIX) tablet 20 mg  20 mg Oral Daily Chrystie Nose, MD   20 mg at 03/03/13 0958  . heparin injection 5,000  Units  5,000 Units Subcutaneous Q8H Maretta Bees, MD   5,000 Units at 03/02/13 1543  . irbesartan (AVAPRO) tablet 75 mg  75 mg Oral Daily Marykay Lex, MD   75 mg at 03/03/13 0957  . levETIRAcetam (KEPPRA) tablet 500 mg  500 mg Oral BID Maretta Bees, MD   500 mg at 03/03/13 0957  . loperamide (IMODIUM) capsule 2 mg  2 mg Oral PRN Maretta Bees, MD   2 mg at 03/02/13 1712  . metoprolol succinate (TOPROL-XL) 24 hr tablet 25 mg  25 mg Oral Daily Chrystie Nose, MD   25 mg at 03/03/13 0957  . ondansetron (ZOFRAN) tablet 4 mg  4 mg Oral Q6H PRN Maretta Bees, MD   4 mg at 02/24/13 1034   Or  . ondansetron [facility]) injection 4 mg  4 mg Intravenous Q6H PRN Maretta Bees, MD   4 mg at 02/22/13 2045  . oxybutynin (DITROPAN-XL) 24 hr tablet 10 mg  10 mg Oral BID Maretta Bees, MD   10 mg at 03/03/13 0958  . oxyCODONE (Oxy IR/ROXICODONE) immediate release tablet 5 mg  5 mg Oral Q6H PRN Maretta Bees, MD   5 mg at 03/02/13 1711  . pantoprazole (PROTONIX)  EC tablet 40 mg  40 mg Oral BID Maretta Bees, MD   40 mg at 03/02/13 2143  . potassium chloride SA (K-DUR,KLOR-CON) CR tablet 40 mEq  40 mEq Oral Daily Chrystie Nose, MD   40 mEq at 03/03/13 0958  . predniSONE (DELTASONE) tablet 30 mg  30 mg Oral Q breakfast Joseph Art, DO   30 mg at 03/03/13 0957  . pregabalin (LYRICA) capsule 75 mg  75 mg Oral BID Maretta Bees, MD   75 mg at 03/02/13 2142  . saccharomyces boulardii (FLORASTOR) capsule 250 mg  250 mg Oral BID Maretta Bees, MD   250 mg at 03/03/13 0958  . sodium chloride 0.9 % injection 3 mL  3 mL Intravenous Q12H Maretta Bees, MD   3 mL at 03/02/13 2146  . spironolactone (ALDACTONE) tablet 25 mg  25 mg Oral Daily Chrystie Nose, MD   25 mg at 03/03/13 4098    Physical Exam: General appearance: alert and no distress Neck: no adenopathy, no carotid bruit, no JVD, supple, symmetrical, trachea midline and thyroid not enlarged, symmetric, no  tenderness/mass/nodules Lungs: clear to auscultation bilaterally Heart: regular rate and rhythm, S1, S2 normal, no murmur, click, rub or gallop Abdomen: soft, non-tender; bowel sounds normal; no masses,  no organomegaly Extremities: edema trace bilateral, +LUE edema Pulses: 2+ and symmetric Skin: Skin color, texture, turgor normal. No rashes or lesions Neurologic: Mental status: Alert, oriented, thought content appropriate, , there is flaccid left hemiplegia  Lab Results: Results for orders placed during the hospital encounter of 02/21/13 (from the past 48 hour(s))  CBC     Status: Abnormal   Collection Time    03/02/13  6:58 AM      Result Value Range   WBC 16.7 (*) 4.0 - 10.5 K/uL   RBC 3.71 (*) 3.87 - 5.11 MIL/uL   Hemoglobin 9.2 (*) 12.0 - 15.0 g/dL   HCT 11.9 (*) 14.7 - 82.9 %   MCV 77.6 (*) 78.0 - 100.0 fL   MCH 24.8 (*) 26.0 - 34.0 pg   MCHC 31.9  30.0 - 36.0 g/dL   RDW 56.2 (*) 13.0 - 86.5 %   Platelets 335  150 - 400 K/uL  BASIC METABOLIC PANEL     Status: Abnormal   Collection Time    03/02/13  6:58 AM      Result Value Range   Sodium 141  135 - 145 mEq/L   Potassium 3.2 (*) 3.5 - 5.1 mEq/L   Chloride 103  96 - 112 mEq/L   CO2 27  19 - 32 mEq/L   Glucose, Bld 84  70 - 99 mg/dL   BUN 13  6 - 23 mg/dL   Creatinine, Ser 7.84 (*) 0.50 - 1.10 mg/dL   Calcium 9.0  8.4 - 69.6 mg/dL   GFR calc non Af Amer 55 (*) >90 mL/min   GFR calc Af Amer 63 (*) >90 mL/min   Comment:            The eGFR has been calculated     using the CKD EPI equation.     This calculation has not been     validated in all clinical     situations.     eGFR's persistently     <90 mL/min signify     possible Chronic Kidney Disease.    Imaging: Nm Myocar Multi W/spect W/wall Motion / Ef  03/02/2013   *RADIOLOGY REPORT*  Clinical Data:  History of acute heart failure, hypertension, renal insufficiency  MYOCARDIAL IMAGING WITH SPECT (REST AND PHARMACOLOGIC-STRESS) GATED LEFT VENTRICULAR WALL  MOTION STUDY LEFT VENTRICULAR EJECTION FRACTION  Technique:  Standard myocardial SPECT imaging was performed after resting intravenous injection of 10 mCi Tc-29m sestamibi. Subsequently, intravenous infusion of lexiscan was performed under the supervision of the Cardiology staff.  At peak effect of the drug, 30 mCi Tc-68m sestamibi was injected intravenously and standard myocardial SPECT  imaging was performed.  Quantitative gated imaging was also performed to evaluate left ventricular wall motion, and estimate left ventricular ejection fraction.  Comparison:  Chest radiograph - 02/26/2013  Findings:  Review of the rotational raw images demonstrates significant breast attenuation.  There is additional attenuation secondary to the overlying left upper extremity.  Significant GI activity is seen, worse on the stress images.  There is no significant patient motion artifact.  SPECT imaging demonstrates mild attenuation involving the anterior wall extending towards the left ventricular apex which improves on the provided stress images.  There is no definitive scintigraphic evidence of prior infarction or pharmacologically induced ischemia.  Quantitative gated analysis shows global hypokinesia.  The resting left ventricular ejection fraction is 43% with end- diastolic volume of 73 ml and end-systolic volume of 42 ml.  IMPRESSION: 1.  Mild attenuation involving the anterior wall extending towards the left ventricular apex.  No definitive scintigraphic evidence of prior infarction or pharmacologically induced ischemia. 2.  Global hypokinesia.  Ejection fraction - 43%.   Original Report Authenticated By: Tacey Ruiz, MD    Assessment:  1. Principal Problem: 2.   Sepsis associated hypotension 3. Active Problems: 4.   Seizure 5.   Hypercholesteremia 6.   GERD (gastroesophageal reflux disease) 7.   UTI (lower urinary tract infection) 8.   ARF (acute renal failure) 9.   Fall 10.   Altered mental status 11.    Severe sepsis 12.   Acute combined systolic and diastolic heart failure 13.   NSTEMI (non-ST elevated myocardial infarction) 14.   Plan:  1. Clinically compensated heart failure. Currently on appropriate heart failure medications including aldactone, primarily for hypokalemia. Will need continued K+ supplementation at home. Ok for discharge from our standpoint today. Will need follow-up in our office in 7-10 days with a mid-level provider and I will be happy to follow her as well.  Time Spent Directly with Patient:  15 minutes  Length of Stay:  LOS: 10 days   Chrystie Nose, MD, [facility] Attending Cardiologist [facility]  Odell Fasching C 03/03/2013, 10:37 AM

## 2013-03-13 ENCOUNTER — Ambulatory Visit (INDEPENDENT_AMBULATORY_CARE_PROVIDER_SITE_OTHER): Payer: Medicare Other | Admitting: Cardiology

## 2013-03-13 ENCOUNTER — Encounter: Payer: Self-pay | Admitting: Cardiology

## 2013-03-13 VITALS — BP 118/88 | HR 88 | Ht 61.0 in | Wt 168.3 lb

## 2013-03-13 DIAGNOSIS — A419 Sepsis, unspecified organism: Secondary | ICD-10-CM

## 2013-03-13 DIAGNOSIS — I5041 Acute combined systolic (congestive) and diastolic (congestive) heart failure: Secondary | ICD-10-CM

## 2013-03-13 DIAGNOSIS — I10 Essential (primary) hypertension: Secondary | ICD-10-CM

## 2013-03-13 DIAGNOSIS — I214 Non-ST elevation (NSTEMI) myocardial infarction: Secondary | ICD-10-CM

## 2013-03-13 DIAGNOSIS — I959 Hypotension, unspecified: Secondary | ICD-10-CM

## 2013-03-13 DIAGNOSIS — R6521 Severe sepsis with septic shock: Secondary | ICD-10-CM

## 2013-03-13 NOTE — Assessment & Plan Note (Signed)
EF dropped from 55/60% to 40-45% in 48hrs, Troponin 0.33

## 2013-03-13 NOTE — Assessment & Plan Note (Signed)
Residual Lt hemiparesis

## 2013-03-13 NOTE — Assessment & Plan Note (Signed)
controlled 

## 2013-03-13 NOTE — Assessment & Plan Note (Signed)
02/21/13 

## 2013-03-13 NOTE — Patient Instructions (Signed)
Return for follow up after echocardiogram in 2months

## 2013-03-13 NOTE — Progress Notes (Signed)
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 She was admitted by [facility] on 02/21/13 for sepsis associated hypotension, felt to be caused by a UTI. She had been treated with IV antibiotics as well a 8-9 L of IV fluid boluses on admission. Subsequently, she developed shortness of breath. An CXR demonstrated mild congestive findings and her BNP was elevated at 15K. An echo was obtained which showed diffuse hypokinesis and an EF of 40-45%. This was changed from an echo 2 days prior, which was normal. Her Troponin was slightly elevated at 0.33. Her EKG showed no acute changes. A Myoview was low risk.           She is seen in the office today in follow up. Her son Thereasa Distance accompanied her. She is fairly debilitated from her  remote stroke. Since discharge she has been doing well, no complaints of SOB.    Current Outpatient Prescriptions  Medication Sig Dispense Refill  . amitriptyline (ELAVIL) 25 MG tablet Take 75 mg by mouth at bedtime.      Marland Kitchen aspirin 81 MG chewable tablet Chew 1 tablet (81 mg total) by mouth daily.  30 tablet  3  . atorvastatin (LIPITOR) 20 MG tablet Take 20 mg by mouth daily.      . Cholecalciferol (VITAMIN D) 1000 UNITS capsule Take 1,000 Units by mouth daily.      . DULoxetine (CYMBALTA) 60 MG capsule Take 60 mg by mouth daily.      Marland Kitchen esomeprazole (NEXIUM) 40 MG capsule Take 40 mg by mouth 2 (two) times daily.      . furosemide (LASIX) 20 MG tablet Take 1 tablet (20 mg total) by mouth daily.  30 tablet  3  . irbesartan (AVAPRO) 75 MG tablet Take 1 tablet (75 mg total) by mouth daily.  30 tablet  3  . levETIRAcetam (KEPPRA) 500 MG tablet Take 500 mg by mouth 2 (two) times daily.      . metoprolol succinate (TOPROL-XL) 25 MG 24 hr tablet Take 1 tablet (25 mg  total) by mouth daily.  30 tablet  3  . OPANA ER, CRUSH RESISTANT, 40 MG T12A Take 1 tablet by mouth 2 (two) times daily.      Marland Kitchen oxybutynin (DITROPAN-XL) 10 MG 24 hr tablet Take 10 mg by mouth 2 (two) times daily.      Marland Kitchen oxyCODONE (OXY IR/ROXICODONE) 5 MG immediate release tablet Take 5 mg by mouth every 8 (eight) hours as needed. For pain      . potassium chloride SA (K-DUR,KLOR-CON) 20 MEQ tablet Take 2 tablets (40 mEq total) by mouth daily.  60 tablet  0  . predniSONE (STERAPRED UNI-PAK) 10 MG tablet Prednisone dosing: Take  Prednisone 20mg  (2 tabs) x 3days, then 10mg  (1 tab) x 3 days, then prednisone 5 mg (1/2 tab) x 3 days, then OFF.  Dispense: 12  tabs, refills: None  12 tablet  0  . pregabalin (LYRICA) 75 MG capsule Take 75 mg by mouth 2 (two) times daily.      Marland Kitchen spironolactone (ALDACTONE) 25 MG tablet Take 1 tablet (25 mg total) by mouth daily.  30 tablet  3   No current facility-administered medications for this visit.    Allergies  Allergen Reactions  . Celebrex (Celecoxib)  Swelling    "lips; it was terrible"  . Baclofen Swelling    History   Social History  . Marital Status: Divorced    Spouse Name: N/A    Number of Children: 2  . Years of Education: N/A   Occupational History  . Not on file.   Social History Main Topics  . Smoking status: Never Smoker   . Smokeless tobacco: Never Used  . Alcohol Use: No  . Drug Use: No  . Sexually Active: Yes   Other Topics Concern  . Not on file   Social History Narrative  . No narrative on file     Review of Systems: General: negative for chills, fever, night sweats or weight changes.  Cardiovascular: negative for chest pain, dyspnea on exertion, edema, orthopnea, palpitations, paroxysmal nocturnal dyspnea or shortness of breath Dermatological: negative for rash Respiratory: negative for cough or wheezing Urologic: negative for hematuria Abdominal: negative for nausea, vomiting, diarrhea, bright red blood per rectum,  melena, or hematemesis Neurologic: negative for visual changes, syncope, or dizziness All other systems reviewed and are otherwise negative except as noted above.    Blood pressure 118/88, pulse 88, height 5\' 1"  (1.549 m), weight 168 lb 4.8 oz (76.34 kg), last menstrual period 09/30/2011.  General appearance: alert, cooperative and no distress Neck: no carotid bruit and no JVD Lungs: clear to auscultation bilaterally Heart: regular rate and rhythm  EKG  EKG: normal EKG, normal sinus rhythm.  ASSESSMENT AND PLAN:   Acute combined systolic and diastolic heart failure Secondary to overhydration in the setting of sepsis  NSTEMI (non-ST elevated myocardial infarction) EF dropped from 55/60% to 40-45% in 48hrs, Troponin 0.33  Sepsis associated hypotension 02/21/13  Hypertension controlled  H/O: CVA (cardiovascular accident) Residual Lt hemiparesis   PLAN  I did not change Jordan Rivas's medications. She needs an echo in 2-3  months and then a follow up with Dr Tresa Endo.   [facility]-C 03/13/2013 4:51 PM

## 2013-03-13 NOTE — Assessment & Plan Note (Signed)
Secondary to overhydration in the setting of sepsis

## 2013-03-25 ENCOUNTER — Ambulatory Visit (HOSPITAL_COMMUNITY): Payer: Medicare Other

## 2013-03-27 ENCOUNTER — Telehealth: Payer: Self-pay | Admitting: Cardiovascular Disease

## 2013-03-27 NOTE — Telephone Encounter (Signed)
[name] did not received a prescription when she left the hospital..(Potassium) . Patient said she does not have the script for this and has not never taken it before , please call..  Thanks

## 2013-05-14 ENCOUNTER — Other Ambulatory Visit (HOSPITAL_COMMUNITY): Payer: Self-pay | Admitting: Family Medicine

## 2013-05-14 DIAGNOSIS — Z1231 Encounter for screening mammogram for malignant neoplasm of breast: Secondary | ICD-10-CM

## 2013-05-15 ENCOUNTER — Ambulatory Visit: Payer: Medicare Other | Admitting: Cardiovascular Disease

## 2013-06-03 ENCOUNTER — Ambulatory Visit (HOSPITAL_COMMUNITY): Payer: Medicare Other

## 2013-06-10 ENCOUNTER — Ambulatory Visit: Payer: Medicare Other | Admitting: Cardiovascular Disease

## 2013-06-11 ENCOUNTER — Ambulatory Visit (HOSPITAL_COMMUNITY): Payer: Medicare Other | Attending: Family Medicine

## 2013-06-17 ENCOUNTER — Ambulatory Visit: Payer: Medicare Other | Admitting: Cardiovascular Disease

## 2013-06-25 ENCOUNTER — Ambulatory Visit (HOSPITAL_COMMUNITY): Payer: Medicare Other

## 2013-10-21 ENCOUNTER — Other Ambulatory Visit: Payer: Self-pay | Admitting: Nurse Practitioner

## 2013-12-23 ENCOUNTER — Other Ambulatory Visit (HOSPITAL_COMMUNITY): Payer: Self-pay | Admitting: Family Medicine

## 2013-12-23 DIAGNOSIS — Z1231 Encounter for screening mammogram for malignant neoplasm of breast: Secondary | ICD-10-CM

## 2014-01-06 ENCOUNTER — Ambulatory Visit (HOSPITAL_COMMUNITY): Admission: RE | Admit: 2014-01-06 | Payer: Medicare Other | Source: Ambulatory Visit

## 2014-01-07 ENCOUNTER — Other Ambulatory Visit: Payer: Self-pay | Admitting: Obstetrics & Gynecology

## 2014-01-21 ENCOUNTER — Ambulatory Visit (HOSPITAL_COMMUNITY): Payer: Medicare Other | Attending: Family Medicine

## 2014-02-08 ENCOUNTER — Other Ambulatory Visit: Payer: Self-pay | Admitting: Neurology

## 2014-02-08 ENCOUNTER — Telehealth: Payer: Self-pay

## 2014-02-08 MED ORDER — LEVETIRACETAM 500 MG PO TABS: ORAL_TABLET | ORAL | Status: DC

## 2014-02-08 NOTE — Telephone Encounter (Signed)
Called and spoke to patient  And patient has a follow up with Dr.Yan 02-09-2014. Patient states she will keep her appt.

## 2014-02-09 ENCOUNTER — Ambulatory Visit (INDEPENDENT_AMBULATORY_CARE_PROVIDER_SITE_OTHER): Payer: PRIVATE HEALTH INSURANCE | Admitting: Neurology

## 2014-02-09 ENCOUNTER — Encounter: Payer: Self-pay | Admitting: Neurology

## 2014-02-09 ENCOUNTER — Telehealth: Payer: Self-pay | Admitting: Neurology

## 2014-02-09 VITALS — BP 115/67 | HR 62 | Ht 61.0 in | Wt 182.0 lb

## 2014-02-09 DIAGNOSIS — R569 Unspecified convulsions: Secondary | ICD-10-CM

## 2014-02-09 MED ORDER — BUTALBITAL-APAP-CAFFEINE 50-325-40 MG PO TABS: 1.0000 | ORAL_TABLET | Freq: Four times a day (QID) | ORAL | Status: DC | PRN

## 2014-02-09 MED ORDER — LEVETIRACETAM 500 MG PO TABS: ORAL_TABLET | ORAL | Status: DC

## 2014-02-09 MED ORDER — ZOLPIDEM TARTRATE 5 MG PO TABS: 5.0000 mg | ORAL_TABLET | Freq: Every evening | ORAL | Status: DC | PRN

## 2014-02-09 NOTE — Telephone Encounter (Signed)
Patient stated insurance will not pay for butalbital-acetaminophen-caffeine (FIORICET) 50-325-40 MG per tablet. Can she switch to another medication.  Please call and advise.

## 2014-02-09 NOTE — Telephone Encounter (Signed)
Jordan Rivas or [name], would you please find out from pharmacy, if her insurance will pay for any medications with similar component as Fioricet

## 2014-02-09 NOTE — Telephone Encounter (Signed)
Pt calling stating that her insurance will not pay for the Fioricet, pt wanted to know if there is something else pt could use. Please advise

## 2014-02-09 NOTE — Progress Notes (Signed)
PATIENT: Jordan Rivas DOB: 1961/03/21  HISTORICAL  Jordan Rivas is on 53 years old African American female, accompanied by her daughter for evaluation of seizure, left spastic hemiparesis  Last clinical visit was with Hoyle Sauer in March 2015, she had a history of right hemisphere hemorrhagic stroke due to brain aneurysm per patient at age 67, was residual severe left spastic hemiparesis, left visual field cut, profound gait difficulty,  She presented with her only seizure in Jan 26 2013, she woke up in the middle of the night using at home, while walking towards the bathroom, she fell, with witnessed seizure-like activity, tongue biting, woke up confused, at the hospital  MRI of the brain demonstrate extensive encephalomalacia involving right basal ganglion, perisylvian fissure region, no acute lesions, she has been treated with Keppra 500 mg twice a day, she has no recurrent seizure,  She continues to complain deep body aching pain at the left side, involving her left upper and lower extremity, specificity, difficulty straightening up her left finger, left leg spasm, she reported improvement of her left lower extremity spasm after receiving Botox injection in the past, but it was many years ago,  She also complains of couple years history of frequent headaches, getting worse almost daily basis, she has been taking frequent Excedrin migraines, left retro-orbital area severe pounding headache was associated light, noise sensitivity,  REVIEW OF SYSTEMS: Full 14 system review of systems performed and notable only for activity change, decreased appetite, fatigue, excessive sweating, double vision, chest tightness, constipation, joint pain, walking difficulty, headaches, depression  ALLERGIES: Allergies  Allergen Reactions  . Celebrex [Celecoxib] Swelling    "lips; it was terrible"  . Baclofen Swelling    HOME MEDICATIONS: Current Outpatient Prescriptions on File Prior to Visit    Medication Sig Dispense Refill  . amitriptyline (ELAVIL) 25 MG tablet Take 75 mg by mouth at bedtime.      Marland Kitchen aspirin 81 MG chewable tablet Chew 1 tablet (81 mg total) by mouth daily.  30 tablet  3  . atorvastatin (LIPITOR) 20 MG tablet Take 20 mg by mouth daily.      . Cholecalciferol (VITAMIN D) 1000 UNITS capsule Take 1,000 Units by mouth daily.      . DULoxetine (CYMBALTA) 60 MG capsule Take 60 mg by mouth daily.      Marland Kitchen esomeprazole (NEXIUM) 40 MG capsule Take 40 mg by mouth 2 (two) times daily.      . furosemide (LASIX) 20 MG tablet Take 1 tablet (20 mg total) by mouth daily.  30 tablet  3  . irbesartan (AVAPRO) 75 MG tablet Take 1 tablet (75 mg total) by mouth daily.  30 tablet  3  . metoprolol succinate (TOPROL-XL) 25 MG 24 hr tablet Take 1 tablet (25 mg total) by mouth daily.  30 tablet  3  . OPANA ER, CRUSH RESISTANT, 40 MG T12A Take 1 tablet by mouth 2 (two) times daily.      Marland Kitchen oxybutynin (DITROPAN-XL) 10 MG 24 hr tablet Take 10 mg by mouth 2 (two) times daily.      Marland Kitchen oxyCODONE (OXY IR/ROXICODONE) 5 MG immediate release tablet Take 5 mg by mouth every 8 (eight) hours as needed. For pain      . potassium chloride SA (K-DUR,KLOR-CON) 20 MEQ tablet Take 2 tablets (40 mEq total) by mouth daily.  60 tablet  0  . predniSONE (STERAPRED UNI-PAK) 10 MG tablet Prednisone dosing: Take  Prednisone 20mg  (2 tabs) x 3days,  then 10mg  (1 tab) x 3 days, then prednisone 5 mg (1/2 tab) x 3 days, then OFF.  Dispense: 12  tabs, refills: None  12 tablet  0  . pregabalin (LYRICA) 75 MG capsule Take 75 mg by mouth 2 (two) times daily.      Marland Kitchen spironolactone (ALDACTONE) 25 MG tablet Take 1 tablet (25 mg total) by mouth daily.  30 tablet  3   No current facility-administered medications on file prior to visit.    PAST MEDICAL HISTORY: Past Medical History  Diagnosis Date  . Hypertension   . Hypercholesteremia   . Insomnia   . Neuropathy   . GERD (gastroesophageal reflux disease)   . Overactive bladder    . Peripheral vascular disease     left arm and leg; since stroke 1995  . Seizures 01/27/12    "first one ever"  . CVA (cerebrovascular accident) 1995    "left side paralyzed"  . Chronic pain     "in this left arm and leg that don't move"    PAST SURGICAL HISTORY: Past Surgical History  Procedure Laterality Date  . Tubal ligation  04/1984  . Cerebral aneurysm repair  11/1993    "left my left side paralyzed"    FAMILY HISTORY: Family History  Problem Relation Age of Onset  . Dementia Mother   . Diabetes type II Mother   . Heart attack Father   . Cancer Mother   . Diabetes Mother     SOCIAL HISTORY:  History   Social History  . Marital Status: Divorced    Spouse Name: N/A    Number of Children: 2  . Years of Education: N/A   Occupational History  . Not on file.   Social History Main Topics  . Smoking status: Never Smoker   . Smokeless tobacco: Never Used  . Alcohol Use: No  . Drug Use: No  . Sexual Activity: Yes   Other Topics Concern  . Not on file   Social History Narrative  . No narrative on file     PHYSICAL EXAM   Filed Vitals:   02/09/14 1233  BP: 115/67  Pulse: 62  Height: 5\' 1"  (1.549 m)  Weight: 182 lb (82.555 kg)    Not recorded    Body mass index is 34.41 kg/(m^2).   Generalized: In no acute distress  Neck: Supple, no carotid bruits   Cardiac: Regular rate rhythm  Pulmonary: Clear to auscultation bilaterally  Musculoskeletal: No deformity  Neurological examination  Mentation: Alert oriented to time, place, history taking, and causual conversation  Cranial nerve II-XII: Pupils were equal round reactive to light. Extraocular movements were full.  Left visual field cut.  Left lower face weakness. Hearing was intact to finger rubbing bilaterally. Uvula tongue midline.  Head turning were normal and symmetric.Tongue protrusion into cheek strength was normal.  Motor: Dense spastic left hemiparesis, left hand thumb in position,  proximal and distal strength is 1 out of 5, left lower extremity 1 out of 5, left knee extension, ankle plantarflexion  Sensory: Intact to fine touch, pinprick, preserved vibratory sensation, and proprioception at toes.  Coordination: Normal finger to nose, heel-to-shin bilaterally there was no truncal ataxia  Gait: Spastic left hemi-circumferential gait, unsteady  Deep tendon reflexes: Left hyperreflexia   DIAGNOSTIC DATA (LABS, IMAGING, TESTING) - I reviewed patient records, labs, notes, testing and imaging myself where available.  Lab Results  Component Value Date   WBC 19.0* 03/03/2013   HGB 10.5* 03/03/2013  HCT 32.2* 03/03/2013   MCV 78.0 03/03/2013   PLT 377 03/03/2013      Component Value Date/Time   NA 138 03/03/2013 1050   K 3.7 03/03/2013 1050   CL 103 03/03/2013 1050   CO2 23 03/03/2013 1050   GLUCOSE 111* 03/03/2013 1050   BUN 12 03/03/2013 1050   CREATININE 1.10 03/03/2013 1050   CALCIUM 9.3 03/03/2013 1050   PROT 6.3 02/22/2013 0430   ALBUMIN 2.6* 02/22/2013 0430   AST 21 02/22/2013 0430   ALT 23 02/22/2013 0430   ALKPHOS 93 02/22/2013 0430   BILITOT 0.1* 02/22/2013 0430   GFRNONAA 56* 03/03/2013 1050   GFRAA 65* 03/03/2013 1050   Lab Results  Component Value Date   CHOL 133 01/28/2012   HDL 38* 01/28/2012   LDLCALC 73 01/28/2012   TRIG 109 01/28/2012   CHOLHDL 3.5 01/28/2012   Lab Results  Component Value Date   HGBA1C 6.2* 01/28/2012   ASSESSMENT AND PLAN  Jordan Rivas is a 53 y.o. female complains of  right hemisphere hemorrhagic stroke, at age 80, 25 years ago, witnessed complex partial seizure, doing well with Keppra 500 mg twice a day, spastic left hemiparesis,  preauthorization for EMG guided Botox injection  She complains of insomnia, will add Ambien 5 mg as needed, she is already taking amitriptyline 25 mg 3 tablets every night, Fioricet as needed     Marcial Pacas, M.D. Ph.D.  [facility] [address], [place], [place] [address] [phone]

## 2014-02-11 NOTE — Telephone Encounter (Signed)
Called pt to inform her that I had called the pharmacy and they told me that the pt has picked up Rx already and I called the pt to see if she still wanted to switch medications. I informed her that her Pharmacy stated that she could take Imitrex and she stated that she would call her insurance to see if they would cover it and if so pt will give our office a call. I advised the pt that if she has any other problems, questions or concerns to call the office. Pt verbalized understanding. FYI

## 2014-02-12 ENCOUNTER — Telehealth: Payer: Self-pay | Admitting: Neurology

## 2014-02-12 MED ORDER — NAPROXEN 500 MG PO TABS: 500.0000 mg | ORAL_TABLET | Freq: Two times a day (BID) | ORAL | Status: DC

## 2014-02-12 NOTE — Telephone Encounter (Signed)
She has history of stroke, not a candidate for triptan, please let her know, I have called in Naproxen 500mg  prn for her.

## 2014-02-12 NOTE — Telephone Encounter (Signed)
Patient calling to see if Dr Krista Blue will give her different medication. Her generic Fioricet is costly but she does have it from pharmacy. Asking for generic Imitrex to be prescribed. Please call

## 2014-02-15 NOTE — Telephone Encounter (Signed)
Called pt to inform her per Dr. Krista Blue that because pt has a history of stroke, pt is not a candidate for triptan and that Dr. Krista Blue has called in Naproxen 500 mg prn for the pt and if she has any other problems, questions or concerns to call the office. Pt verbalized understanding.

## 2014-02-18 ENCOUNTER — Other Ambulatory Visit (HOSPITAL_COMMUNITY)
Admission: RE | Admit: 2014-02-18 | Discharge: 2014-02-18 | Disposition: A | Discharge status: Home / Self Care | Payer: Medicare Other | Source: Ambulatory Visit | Attending: Family Medicine | Admitting: Family Medicine

## 2014-02-18 ENCOUNTER — Other Ambulatory Visit: Payer: Self-pay | Admitting: Family Medicine

## 2014-02-18 DIAGNOSIS — Z124 Encounter for screening for malignant neoplasm of cervix: Secondary | ICD-10-CM | POA: Insufficient documentation

## 2014-02-18 DIAGNOSIS — Z1151 Encounter for screening for human papillomavirus (HPV): Secondary | ICD-10-CM | POA: Insufficient documentation

## 2014-02-22 LAB — CYTOLOGY - PAP

## 2014-02-23 ENCOUNTER — Telehealth: Payer: Self-pay | Admitting: Neurology

## 2014-02-23 NOTE — Telephone Encounter (Signed)
The patient indicates Zolpidem 5mg  is not effective for her insomnia.  She is also taking Amitriptyline 25 mg 3 tablets every night.  Patient is requesting either a higher dose on Ambien or a drug change.  Please advise.  Thank you.

## 2014-02-23 NOTE — Telephone Encounter (Signed)
Patient called and stated zolpidem (AMBIEN) 5 MG tablet isn't strong enough.  Still waking up during the night.  Please call and advise.

## 2014-02-23 NOTE — Telephone Encounter (Signed)
Jordan Rivas, please inform patient, she may contact her primary care for further treatment of her insomnia.

## 2014-02-23 NOTE — Telephone Encounter (Signed)
I called the patient back.  Got no answer.  Left message relaying Dr Rhea Belton note.

## 2014-02-25 ENCOUNTER — Other Ambulatory Visit: Payer: Self-pay | Admitting: Family Medicine

## 2014-02-25 DIAGNOSIS — R748 Abnormal levels of other serum enzymes: Secondary | ICD-10-CM

## 2014-02-25 DIAGNOSIS — R1011 Right upper quadrant pain: Secondary | ICD-10-CM

## 2014-03-03 ENCOUNTER — Ambulatory Visit (INDEPENDENT_AMBULATORY_CARE_PROVIDER_SITE_OTHER): Payer: PRIVATE HEALTH INSURANCE | Admitting: Neurology

## 2014-03-03 ENCOUNTER — Encounter (INDEPENDENT_AMBULATORY_CARE_PROVIDER_SITE_OTHER): Payer: Self-pay

## 2014-03-03 ENCOUNTER — Encounter: Payer: Self-pay | Admitting: Neurology

## 2014-03-03 DIAGNOSIS — G8110 Spastic hemiplegia affecting unspecified side: Secondary | ICD-10-CM

## 2014-03-03 DIAGNOSIS — G8114 Spastic hemiplegia affecting left nondominant side: Secondary | ICD-10-CM

## 2014-03-03 DIAGNOSIS — E7800 Pure hypercholesterolemia, unspecified: Secondary | ICD-10-CM

## 2014-03-03 DIAGNOSIS — I69939 Monoplegia of upper limb following unspecified cerebrovascular disease affecting unspecified side: Secondary | ICD-10-CM

## 2014-03-03 DIAGNOSIS — I10 Essential (primary) hypertension: Secondary | ICD-10-CM

## 2014-03-03 DIAGNOSIS — R569 Unspecified convulsions: Secondary | ICD-10-CM

## 2014-03-04 DIAGNOSIS — G8114 Spastic hemiplegia affecting left nondominant side: Secondary | ICD-10-CM | POA: Insufficient documentation

## 2014-03-04 MED ORDER — ONABOTULINUMTOXINA 100 UNITS IJ SOLR
400.0000 [IU] | Freq: Once | INTRAMUSCULAR | Status: AC
  Administered 2014-03-04: 400 [IU] via INTRAMUSCULAR

## 2014-03-04 NOTE — Progress Notes (Signed)
PATIENT: Jordan Rivas DOB: November 27, 1960  HISTORICAL  Jordan Rivas is on 53 years old African American female, accompanied by her daughter for evaluation of seizure, left spastic hemiparesis  Last clinical visit was with Hoyle Sauer in March 2015, she had a history of right hemisphere hemorrhagic stroke due to brain aneurysm per patient at age 43, was residual severe left spastic hemiparesis, left visual field cut, profound gait difficulty,  She presented with her only seizure in Jan 26 2013, she woke up in the middle of the night using at home, while walking towards the bathroom, she fell, with witnessed seizure-like activity, tongue biting, woke up confused, at the hospital  MRI of the brain demonstrate extensive encephalomalacia involving right basal ganglion, perisylvian fissure region, no acute lesions, she has been treated with Keppra 500 mg twice a day, she has no recurrent seizure,  She continues to complain deep body aching pain at the left side, involving her left upper and lower extremity, specificity, difficulty straightening up her left finger, left leg spasm, she reported improvement of her left lower extremity spasm after receiving Botox injection in the past, but it was many years ago,  She also complains of couple years history of frequent headaches, getting worse almost daily basis, she has been taking frequent Excedrin migraines, left retro-orbital area severe pounding headache was associated light, noise sensitivity,  UPDATE July 1st 2015: Patient returned for EMG guided Botox injection for her spastic left upper, and the lower extremity, potential side effects explained, she wants the injection to emphasize on her left ankle plantarflexion, and left finger flexion  REVIEW OF SYSTEMS: Full 14 system review of systems performed and notable only for activity change, decreased appetite, fatigue, excessive sweating, double vision, chest tightness, constipation, joint pain, walking  difficulty, headaches, depression  ALLERGIES: Allergies  Allergen Reactions  . Celebrex [Celecoxib] Swelling    "lips; it was terrible"  . Baclofen Swelling    HOME MEDICATIONS: Current Outpatient Prescriptions on File Prior to Visit  Medication Sig Dispense Refill  . amitriptyline (ELAVIL) 25 MG tablet Take 75 mg by mouth at bedtime.      Marland Kitchen aspirin 81 MG chewable tablet Chew 1 tablet (81 mg total) by mouth daily.  30 tablet  3  . atorvastatin (LIPITOR) 20 MG tablet Take 20 mg by mouth daily.      . butalbital-acetaminophen-caffeine (FIORICET) 50-325-40 MG per tablet Take 1-2 tablets by mouth every 6 (six) hours as needed for headache.  30 tablet  5  . Cholecalciferol (VITAMIN D) 1000 UNITS capsule Take 1,000 Units by mouth daily.      . DULoxetine (CYMBALTA) 60 MG capsule Take 60 mg by mouth daily.      Marland Kitchen esomeprazole (NEXIUM) 40 MG capsule Take 40 mg by mouth 2 (two) times daily.      . furosemide (LASIX) 20 MG tablet Take 1 tablet (20 mg total) by mouth daily.  30 tablet  3  . irbesartan (AVAPRO) 75 MG tablet Take 1 tablet (75 mg total) by mouth daily.  30 tablet  3  . levETIRAcetam (KEPPRA) 500 MG tablet take 1 tablet by mouth twice a day  180 tablet  3  . metoprolol succinate (TOPROL-XL) 25 MG 24 hr tablet Take 1 tablet (25 mg total) by mouth daily.  30 tablet  3  . naproxen (NAPROSYN) 500 MG tablet Take 1 tablet (500 mg total) by mouth 2 (two) times daily with a meal.  60 tablet  3  .  OPANA ER, CRUSH RESISTANT, 40 MG T12A Take 1 tablet by mouth 2 (two) times daily.      Marland Kitchen oxybutynin (DITROPAN-XL) 10 MG 24 hr tablet Take 10 mg by mouth 2 (two) times daily.      Marland Kitchen oxyCODONE (OXY IR/ROXICODONE) 5 MG immediate release tablet Take 5 mg by mouth every 8 (eight) hours as needed. For pain      . potassium chloride SA (K-DUR,KLOR-CON) 20 MEQ tablet Take 2 tablets (40 mEq total) by mouth daily.  60 tablet  0  . predniSONE (STERAPRED UNI-PAK) 10 MG tablet Prednisone dosing: Take  Prednisone  20mg  (2 tabs) x 3days, then 10mg  (1 tab) x 3 days, then prednisone 5 mg (1/2 tab) x 3 days, then OFF.  Dispense: 12  tabs, refills: None  12 tablet  0  . pregabalin (LYRICA) 75 MG capsule Take 75 mg by mouth 2 (two) times daily.      Marland Kitchen spironolactone (ALDACTONE) 25 MG tablet Take 1 tablet (25 mg total) by mouth daily.  30 tablet  3  . zolpidem (AMBIEN) 5 MG tablet Take 1 tablet (5 mg total) by mouth at bedtime as needed for sleep.  30 tablet  5   No current facility-administered medications on file prior to visit.    PAST MEDICAL HISTORY: Past Medical History  Diagnosis Date  . Hypertension   . Hypercholesteremia   . Insomnia   . Neuropathy   . GERD (gastroesophageal reflux disease)   . Overactive bladder   . Peripheral vascular disease     left arm and leg; since stroke 1995  . Seizures 01/27/12    "first one ever"  . CVA (cerebrovascular accident) 1995    "left side paralyzed"  . Chronic pain     "in this left arm and leg that don't move"    PAST SURGICAL HISTORY: Past Surgical History  Procedure Laterality Date  . Tubal ligation  04/1984  . Cerebral aneurysm repair  11/1993    "left my left side paralyzed"    FAMILY HISTORY: Family History  Problem Relation Age of Onset  . Dementia Mother   . Diabetes type II Mother   . Heart attack Father   . Cancer Mother   . Diabetes Mother     SOCIAL HISTORY:  History   Social History  . Marital Status: Divorced    Spouse Name: N/A    Number of Children: 2  . Years of Education: N/A   Occupational History  . Not on file.   Social History Main Topics  . Smoking status: Never Smoker   . Smokeless tobacco: Never Used  . Alcohol Use: No  . Drug Use: No  . Sexual Activity: Yes   Other Topics Concern  . Not on file   Social History Narrative  . No narrative on file     PHYSICAL EXAM   Filed Vitals:    Not recorded    Cannot calculate BMI with a height equal to zero.   Generalized: In no acute  distress  Neck: Supple, no carotid bruits   Cardiac: Regular rate rhythm  Pulmonary: Clear to auscultation bilaterally  Musculoskeletal: No deformity  Neurological examination  Mentation: Alert oriented to time, place, history taking, and causual conversation  Cranial nerve II-XII: Pupils were equal round reactive to light. Extraocular movements were full.  Left visual field cut.  Left lower face weakness. Hearing was intact to finger rubbing bilaterally. Uvula tongue midline.  Head turning were normal  and symmetric.Tongue protrusion into cheek strength was normal.  Motor: Dense spastic left hemiparesis, left hand thumb in position, proximal and distal strength is 1 out of 5, left lower extremity 1 out of 5, left knee extension, ankle plantarflexion, when she ambulate, she complains of painful left toe flexion, left ankle plantarflexion  Sensory: Intact to fine touch, pinprick, preserved vibratory sensation, and proprioception at toes.  Coordination: Normal finger to nose, heel-to-shin bilaterally there was no truncal ataxia  Gait: Spastic left hemi-circumferential gait, unsteady  Deep tendon reflexes: Left hyperreflexia   DIAGNOSTIC DATA (LABS, IMAGING, TESTING) - I reviewed patient records, labs, notes, testing and imaging myself where available.  Lab Results  Component Value Date   WBC 19.0* 03/03/2013   HGB 10.5* 03/03/2013   HCT 32.2* 03/03/2013   MCV 78.0 03/03/2013   PLT 377 03/03/2013      Component Value Date/Time   NA 138 03/03/2013 1050   K 3.7 03/03/2013 1050   CL 103 03/03/2013 1050   CO2 23 03/03/2013 1050   GLUCOSE 111* 03/03/2013 1050   BUN 12 03/03/2013 1050   CREATININE 1.10 03/03/2013 1050   CALCIUM 9.3 03/03/2013 1050   PROT 6.3 02/22/2013 0430   ALBUMIN 2.6* 02/22/2013 0430   AST 21 02/22/2013 0430   ALT 23 02/22/2013 0430   ALKPHOS 93 02/22/2013 0430   BILITOT 0.1* 02/22/2013 0430   GFRNONAA 56* 03/03/2013 1050   GFRAA 65* 03/03/2013 1050   Lab Results  Component Value  Date   CHOL 133 01/28/2012   HDL 38* 01/28/2012   LDLCALC 73 01/28/2012   TRIG 109 01/28/2012   CHOLHDL 3.5 01/28/2012   Lab Results  Component Value Date   HGBA1C 6.2* 01/28/2012   ASSESSMENT AND PLAN  AUDRYANA HOCKENBERRY is a 53 y.o. female complains of  right hemisphere hemorrhagic stroke, at age 50, 44 years ago, witnessed complex partial seizure, doing well with Keppra 500 mg twice a day, spastic left hemiparesis,  Electrical stimulation guided Botox injection, I used 400 units of Botox A. (100 units/2 cc of NS).  Left tibialis posterior 25 x3= 75 Left flexor digitorum longus 25 x3= 75  Left flexor digitorum brevis 25 Left flexor hallucis longus 25  Left pronator teres 25 Left flexor carpi radialis 25 Left flexor carpi ulnaris 25  Left lumbricals injection, divided into  The second third fourth= 25 Left adductor pollicis 25  Left palmaris longus 25 Left flexor digitorum profundi 25 Left flexor digitorum superficialis 25  She tolerated the injection well, will return to clinic in 3 months for repeat injection      Marcial Pacas, M.D. Ph.D.  [facility] [address] [place], [place] [address] [phone]

## 2014-03-08 ENCOUNTER — Other Ambulatory Visit: Payer: Medicare Other

## 2014-04-13 ENCOUNTER — Ambulatory Visit: Payer: Medicare Other | Admitting: *Deleted

## 2014-05-18 ENCOUNTER — Telehealth: Payer: Self-pay | Admitting: Neurology

## 2014-05-18 MED ORDER — BUTALBITAL-APAP-CAFFEINE 50-325-40 MG PO TABS: 1.0000 | ORAL_TABLET | Freq: Four times a day (QID) | ORAL | Status: DC | PRN

## 2014-05-18 NOTE — Telephone Encounter (Signed)
Patient requesting to try another Migraine medication due to naproxen (NAPROSYN) 500 MG tablet isn't beneficial.  Please call anytime and may leave detailed message if not available.

## 2014-05-18 NOTE — Telephone Encounter (Signed)
Jessica: Please let patient know, I have called in Fioricet for migraines

## 2014-05-18 NOTE — Telephone Encounter (Signed)
Patient indicates Naproxen 500mg  is no longer beneficial for her migraine headaches.  She is requesting we prescribe something else.  Phone note from 06/12 says she is not a candidate for Triptan therapy due to history of stroke.   Please advise.  Thank you.

## 2014-05-19 NOTE — Telephone Encounter (Signed)
I called the patient back.  Got no answer.  Left message. 

## 2014-05-26 ENCOUNTER — Telehealth: Payer: Self-pay | Admitting: Neurology

## 2014-05-26 NOTE — Telephone Encounter (Signed)
Patient requesting refill of Fioricet, please return call and advise.

## 2014-05-27 NOTE — Telephone Encounter (Signed)
Pt's Rx was already filled.

## 2014-06-04 ENCOUNTER — Telehealth: Payer: Self-pay | Admitting: Neurology

## 2014-06-04 MED ORDER — TRAMADOL HCL 50 MG PO TABS: 50.0000 mg | ORAL_TABLET | Freq: Four times a day (QID) | ORAL | Status: DC | PRN

## 2014-06-04 NOTE — Telephone Encounter (Signed)
Jessica: Please let patient know, I have called in tramadol 50 mg as needed 40 tablets, with 4 refills

## 2014-06-04 NOTE — Telephone Encounter (Signed)
I called back.  Patient would like something else prescribed besides Fioricet or Naproxen.  Says Naproxen is not effective and Fioricet cost too much.  (I called the pharmacy, this Rx is $23).  She is not a candidate for Triptans due to history of stroke.  Please advise.  Thank you.

## 2014-06-04 NOTE — Telephone Encounter (Signed)
I called the patient.  She is aware.

## 2014-06-04 NOTE — Telephone Encounter (Signed)
Patient calling to state that she needs headache medication that is covered by her insurance, please return call and advise.

## 2014-06-07 ENCOUNTER — Telehealth: Payer: Self-pay | Admitting: *Deleted

## 2014-06-09 ENCOUNTER — Ambulatory Visit: Payer: Medicare Other | Admitting: *Deleted

## 2014-06-09 ENCOUNTER — Ambulatory Visit: Payer: PRIVATE HEALTH INSURANCE | Admitting: Neurology

## 2014-06-09 NOTE — Telephone Encounter (Signed)
Called pt and left message informing her to give our office a call to reschedule her appt for her Botox injections and if she has any other problems, questions or concerns to call the office.

## 2014-07-05 ENCOUNTER — Encounter: Payer: Self-pay | Admitting: Neurology

## 2014-07-21 ENCOUNTER — Ambulatory Visit: Payer: PRIVATE HEALTH INSURANCE | Admitting: Neurology

## 2014-07-27 ENCOUNTER — Encounter: Payer: Self-pay | Admitting: Nurse Practitioner

## 2014-07-28 ENCOUNTER — Ambulatory Visit (INDEPENDENT_AMBULATORY_CARE_PROVIDER_SITE_OTHER): Payer: PRIVATE HEALTH INSURANCE | Admitting: Neurology

## 2014-07-28 ENCOUNTER — Encounter: Payer: Self-pay | Admitting: Neurology

## 2014-07-28 DIAGNOSIS — G8114 Spastic hemiplegia affecting left nondominant side: Secondary | ICD-10-CM | POA: Diagnosis not present

## 2014-07-28 DIAGNOSIS — Z8673 Personal history of transient ischemic attack (TIA), and cerebral infarction without residual deficits: Secondary | ICD-10-CM

## 2014-07-28 DIAGNOSIS — G8110 Spastic hemiplegia affecting unspecified side: Secondary | ICD-10-CM

## 2014-07-28 DIAGNOSIS — R569 Unspecified convulsions: Secondary | ICD-10-CM

## 2014-07-28 MED ORDER — BUTALBITAL-APAP-CAFFEINE 50-325-40 MG PO TABS: 1.0000 | ORAL_TABLET | Freq: Four times a day (QID) | ORAL | Status: DC | PRN

## 2014-07-28 NOTE — Progress Notes (Signed)
PATIENT: Jordan Rivas DOB: [birth year]  HISTORICAL  Jordan Rivas is on 53 years old African American female, accompanied by her daughter for evaluation of seizure, left spastic hemiparesis  Last clinical visit was with Hoyle Sauer in March 2015, she had a history of right hemisphere hemorrhagic stroke due to brain aneurysm per patient at age 56, was residual severe left spastic hemiparesis, left visual field cut, profound gait difficulty,  She presented with her only seizure in Jan 26 2013, she woke up in the middle of the night using at home, while walking towards the bathroom, she fell, with witnessed seizure-like activity, tongue biting, woke up confused, at the hospital  MRI of the brain demonstrate extensive encephalomalacia involving right basal ganglion, perisylvian fissure region, no acute lesions, she has been treated with Keppra 500 mg twice a day, she has no recurrent seizure,  She continues to complain deep body aching pain at the left side, involving her left upper and lower extremity, specificity, difficulty straightening up her left finger, left leg spasm, she reported improvement of her left lower extremity spasm after receiving Botox injection in the past, but it was many years ago,  She also complains of couple years history of frequent headaches, getting worse almost daily basis, she has been taking frequent Excedrin migraines, left retro-orbital area severe pounding headache was associated light, noise sensitivity,  UPDATE July 1st 2015: Patient returned for EMG guided Botox injection for her spastic left upper, and the lower extremity, potential side effects explained, she wants the injection to emphasize on her left ankle plantarflexion, and left finger flexion  UPDATE Nov 26th 2015: She responded very well to her first EMG guided Botox injection in July first 2015, she received 400 units, she reported less spasticity of her left arm, left foot, ambulate better, less left  leg pain, no significant side effect noticed  REVIEW OF SYSTEMS: Full 14 system review of systems performed and notable only for activity change, decreased appetite, fatigue, excessive sweating, double vision, chest tightness, constipation, joint pain, walking difficulty, headaches, depression  ALLERGIES: Allergies  Allergen Reactions  . Celebrex [Celecoxib] Swelling    "lips; it was terrible"  . Baclofen Swelling    HOME MEDICATIONS: Current Outpatient Prescriptions on File Prior to Visit  Medication Sig Dispense Refill  . amitriptyline (ELAVIL) 25 MG tablet Take 75 mg by mouth at bedtime.    Marland Kitchen aspirin 81 MG chewable tablet Chew 1 tablet (81 mg total) by mouth daily. 30 tablet 3  . atorvastatin (LIPITOR) 20 MG tablet Take 20 mg by mouth daily.    . butalbital-acetaminophen-caffeine (FIORICET) 50-325-40 MG per tablet Take 1-2 tablets by mouth every 6 (six) hours as needed for headache. 30 tablet 5  . butalbital-acetaminophen-caffeine (FIORICET, ESGIC) 50-325-40 MG per tablet Take 1 tablet by mouth every 6 (six) hours as needed for headache. 15 tablet 5  . Cholecalciferol (VITAMIN D) 1000 UNITS capsule Take 1,000 Units by mouth daily.    . DULoxetine (CYMBALTA) 60 MG capsule Take 60 mg by mouth daily.    Marland Kitchen esomeprazole (NEXIUM) 40 MG capsule Take 40 mg by mouth 2 (two) times daily.    . furosemide (LASIX) 20 MG tablet Take 1 tablet (20 mg total) by mouth daily. 30 tablet 3  . irbesartan (AVAPRO) 75 MG tablet Take 1 tablet (75 mg total) by mouth daily. 30 tablet 3  . levETIRAcetam (KEPPRA) 500 MG tablet take 1 tablet by mouth twice a day 180 tablet 3  .  metoprolol succinate (TOPROL-XL) 25 MG 24 hr tablet Take 1 tablet (25 mg total) by mouth daily. 30 tablet 3  . naproxen (NAPROSYN) 500 MG tablet Take 1 tablet (500 mg total) by mouth 2 (two) times daily with a meal. 60 tablet 3  . OPANA ER, CRUSH RESISTANT, 40 MG T12A Take 1 tablet by mouth 2 (two) times daily.    Marland Kitchen oxybutynin (DITROPAN-XL)  10 MG 24 hr tablet Take 10 mg by mouth 2 (two) times daily.    Marland Kitchen oxyCODONE (OXY IR/ROXICODONE) 5 MG immediate release tablet Take 5 mg by mouth every 8 (eight) hours as needed. For pain    . potassium chloride SA (K-DUR,KLOR-CON) 20 MEQ tablet Take 2 tablets (40 mEq total) by mouth daily. 60 tablet 0  . predniSONE (STERAPRED UNI-PAK) 10 MG tablet Prednisone dosing: Take  Prednisone 20mg  (2 tabs) x 3days, then 10mg  (1 tab) x 3 days, then prednisone 5 mg (1/2 tab) x 3 days, then OFF.  Dispense: 12  tabs, refills: None 12 tablet 0  . pregabalin (LYRICA) 75 MG capsule Take 75 mg by mouth 2 (two) times daily.    Marland Kitchen spironolactone (ALDACTONE) 25 MG tablet Take 1 tablet (25 mg total) by mouth daily. 30 tablet 3  . traMADol (ULTRAM) 50 MG tablet Take 1 tablet (50 mg total) by mouth every 6 (six) hours as needed. 40 tablet 4  . zolpidem (AMBIEN) 5 MG tablet Take 1 tablet (5 mg total) by mouth at bedtime as needed for sleep. 30 tablet 5   No current facility-administered medications on file prior to visit.    PAST MEDICAL HISTORY: Past Medical History  Diagnosis Date  . Hypertension   . Hypercholesteremia   . Insomnia   . Neuropathy   . GERD (gastroesophageal reflux disease)   . Overactive bladder   . Peripheral vascular disease     left arm and leg; since stroke 1995  . Seizures 01/27/12    "first one ever"  . CVA (cerebrovascular accident) 1995    "left side paralyzed"  . Chronic pain     "in this left arm and leg that don't move"    PAST SURGICAL HISTORY: Past Surgical History  Procedure Laterality Date  . Tubal ligation  04/1984  . Cerebral aneurysm repair  11/1993    "left my left side paralyzed"    FAMILY HISTORY: Family History  Problem Relation Age of Onset  . Dementia Mother   . Diabetes type II Mother   . Heart attack Father   . Cancer Mother   . Diabetes Mother     SOCIAL HISTORY:  History   Social History  . Marital Status: Divorced    Spouse Name: N/A     Number of Children: 2  . Years of Education: N/A   Occupational History  . Not on file.   Social History Main Topics  . Smoking status: Never Smoker   . Smokeless tobacco: Never Used  . Alcohol Use: No  . Drug Use: No  . Sexual Activity: Yes   Other Topics Concern  . Not on file   Social History Narrative     PHYSICAL EXAM   Filed Vitals:    Not recorded      Cannot calculate BMI with a height equal to zero.   Generalized: In no acute distress  Neck: Supple, no carotid bruits   Cardiac: Regular rate rhythm  Pulmonary: Clear to auscultation bilaterally  Musculoskeletal: No deformity  Neurological examination  Mentation: Alert oriented to time, place, history taking, and causual conversation  Cranial nerve II-XII: Pupils were equal round reactive to light. Extraocular movements were full.  Left visual field cut.  Left lower face weakness. Hearing was intact to finger rubbing bilaterally. Uvula tongue midline.  Head turning were normal and symmetric.Tongue protrusion into cheek strength was normal.  Motor: Dense spastic left hemiparesis, left hand thumb in position, proximal and distal strength is 1 out of 5, left lower extremity 1 out of 5, left knee extension, ankle plantarflexion, when she ambulate, she complains of painful left toe flexion, left ankle plantarflexion  Sensory: Intact to fine touch, pinprick, preserved vibratory sensation, and proprioception at toes.  Coordination: Normal finger to nose, heel-to-shin bilaterally there was no truncal ataxia  Gait: Spastic left hemi-circumferential gait, unsteady  Deep tendon reflexes: Left hyperreflexia   DIAGNOSTIC DATA (LABS, IMAGING, TESTING) - I reviewed patient records, labs, notes, testing and imaging myself where available.  Lab Results  Component Value Date   WBC 19.0* 03/03/2013   HGB 10.5* 03/03/2013   HCT 32.2* 03/03/2013   MCV 78.0 03/03/2013   PLT 377 03/03/2013      Component Value  Date/Time   NA 138 03/03/2013 1050   K 3.7 03/03/2013 1050   CL 103 03/03/2013 1050   CO2 23 03/03/2013 1050   GLUCOSE 111* 03/03/2013 1050   BUN 12 03/03/2013 1050   CREATININE 1.10 03/03/2013 1050   CALCIUM 9.3 03/03/2013 1050   PROT 6.3 02/22/2013 0430   ALBUMIN 2.6* 02/22/2013 0430   AST 21 02/22/2013 0430   ALT 23 02/22/2013 0430   ALKPHOS 93 02/22/2013 0430   BILITOT 0.1* 02/22/2013 0430   GFRNONAA 56* 03/03/2013 1050   GFRAA 65* 03/03/2013 1050   Lab Results  Component Value Date   CHOL 133 01/28/2012   HDL 38* 01/28/2012   LDLCALC 73 01/28/2012   TRIG 109 01/28/2012   CHOLHDL 3.5 01/28/2012   Lab Results  Component Value Date   HGBA1C 6.2* 01/28/2012   ASSESSMENT AND PLAN  Jordan Rivas is a 53 y.o. female complains of  right hemisphere hemorrhagic stroke, at age 60, 19 years ago, witnessed complex partial seizure, doing well with Keppra 500 mg twice a day, spastic left hemiparesis, responded very well to previous Botox injection    Electrical stimulation guided Botox injection, I used 500 units of Botox A   Left adductor longus 50 units Left adductor magnus 50 units  Left tibialis posterior 25 x3= 75 Left flexor digitorum longus 25 x3= 75  Left flexor digitorum brevis 25 Left flexor hallucis longus 25  Left pronator teres 25 Left flexor carpi radialis 25 Left flexor carpi ulnaris 25  Left lumbricals injection, divided into  The second third fourth= 25 Left adductor pollicis 25  Left palmaris longus 25 Left flexor digitorum profundi 25 Left flexor digitorum superficialis 25  She tolerated the injection well, will return to clinic in 3 months for repeat injection      Marcial Pacas, M.D. Ph.D.  [facility] [address], [place], [place] [address] [phone]

## 2014-07-29 MED ORDER — ONABOTULINUMTOXINA 100 UNITS IJ SOLR
500.0000 [IU] | Freq: Once | INTRAMUSCULAR | Status: AC
  Administered 2014-07-29: 500 [IU] via INTRAMUSCULAR

## 2014-08-02 ENCOUNTER — Other Ambulatory Visit: Payer: Self-pay | Admitting: Neurology

## 2014-08-12 ENCOUNTER — Telehealth: Payer: Self-pay | Admitting: Neurology

## 2014-08-12 NOTE — Telephone Encounter (Signed)
I called back.  Patient says she has had a headache for a couple days.  Indicates she has been taking Fioricet once every six hours, but pain still remains.  She does not feel this medication is working as well as it used to and would like to know if Dr Krista Blue can recommend anything else.  Please advise.  Thank you.

## 2014-08-12 NOTE — Telephone Encounter (Signed)
Pt states that her headaches are getting worse and she is out of butalbital-acetaminophen-caffeine (FIORICET, ESGIC) 50-325-40 MG per tablet, but it is not working like it was in the beginning.  Her headaches are still hanging around after she takes it.  Please call and advise.

## 2014-08-12 NOTE — Telephone Encounter (Signed)
I have called patient, she complains of frequent severe right-sided headaches, reviewed medication list, this including chronic narcotics, Lyrica, amitriptyline Fioricet Cymbalta,  I have advised her for thermotherapy, resting Excedrin Migraine was helpful per patient, limit use to less than 3 days in the week May also consider when necessary NSAIDs, Tylenol, aspirin,

## 2014-08-26 ENCOUNTER — Other Ambulatory Visit: Payer: Self-pay | Admitting: Family Medicine

## 2014-08-26 DIAGNOSIS — R945 Abnormal results of liver function studies: Principal | ICD-10-CM

## 2014-08-26 DIAGNOSIS — R7989 Other specified abnormal findings of blood chemistry: Secondary | ICD-10-CM

## 2014-09-01 ENCOUNTER — Other Ambulatory Visit: Payer: Medicare Other

## 2014-09-07 ENCOUNTER — Other Ambulatory Visit: Payer: Medicare Other

## 2014-09-15 ENCOUNTER — Ambulatory Visit
Admission: RE | Admit: 2014-09-15 | Discharge: 2014-09-15 | Disposition: A | Discharge status: Home / Self Care | Payer: Medicaid Other | Source: Ambulatory Visit | Attending: Family Medicine | Admitting: Family Medicine

## 2014-09-15 ENCOUNTER — Other Ambulatory Visit: Payer: Self-pay | Admitting: Family Medicine

## 2014-09-15 DIAGNOSIS — R7989 Other specified abnormal findings of blood chemistry: Secondary | ICD-10-CM

## 2014-09-15 DIAGNOSIS — R945 Abnormal results of liver function studies: Principal | ICD-10-CM

## 2014-10-27 ENCOUNTER — Encounter: Payer: Self-pay | Admitting: Neurology

## 2014-10-27 ENCOUNTER — Ambulatory Visit (INDEPENDENT_AMBULATORY_CARE_PROVIDER_SITE_OTHER): Payer: 59 | Admitting: Neurology

## 2014-10-27 VITALS — BP 136/79 | HR 86 | Ht 61.0 in | Wt 168.0 lb

## 2014-10-27 DIAGNOSIS — G8110 Spastic hemiplegia affecting unspecified side: Secondary | ICD-10-CM

## 2014-10-27 DIAGNOSIS — G8114 Spastic hemiplegia affecting left nondominant side: Secondary | ICD-10-CM

## 2014-10-27 DIAGNOSIS — R569 Unspecified convulsions: Secondary | ICD-10-CM

## 2014-10-27 DIAGNOSIS — G4700 Insomnia, unspecified: Secondary | ICD-10-CM | POA: Insufficient documentation

## 2014-10-27 MED ORDER — ZOLPIDEM TARTRATE ER 12.5 MG PO TBCR: 12.5000 mg | EXTENDED_RELEASE_TABLET | Freq: Every evening | ORAL | Status: DC | PRN

## 2014-10-27 MED ORDER — ONABOTULINUMTOXINA 100 UNITS IJ SOLR
400.0000 [IU] | Freq: Once | INTRAMUSCULAR | Status: AC
  Administered 2014-10-27: 400 [IU] via INTRAMUSCULAR

## 2014-10-27 NOTE — Progress Notes (Signed)
PATIENT: Jordan Rivas DOB: 06-11-1961  HISTORICAL  JESSICALYNN DESHONG is on 54 years old African American female, accompanied by her daughter for evaluation of seizure, left spastic hemiparesis  She had a history of right hemisphere hemorrhagic stroke due to brain aneurysm per patient at age 60, was residual severe left spastic hemiparesis, left visual field cut, profound gait difficulty,  She presented with her only seizure in Jan 26 2013, she woke up in the middle of the night using at home, while walking towards the bathroom, she fell, with witnessed seizure-like activity, tongue biting, woke up confused, at the hospital  MRI of the brain demonstrate extensive encephalomalacia involving right basal ganglion, perisylvian fissure region, no acute lesions, she has been treated with Keppra 500 mg twice a day, she has no recurrent seizure,  She continues to complain deep body aching pain at the left side, involving her left upper and lower extremity, specificity, difficulty straightening up her left finger, left leg spasm, she reported improvement of her left lower extremity spasm after receiving Botox injection in the past, but it was many years ago,  She also complains of couple years history of frequent headaches, getting worse almost daily basis, she has been taking frequent Excedrin migraines, left retro-orbital area severe pounding headache was associated light, noise sensitivity,  UPDATE July 1st 2015: Patient returned for EMG guided Botox injection for her spastic left upper, and the lower extremity, potential side effects explained, she wants the injection to emphasize on her left ankle plantarflexion, and left finger flexion  UPDATE Nov 26th 2015: She responded very well to her first EMG guided Botox injection in July first 2015, she received 400 units, she reported less spasticity of her left arm, left foot, ambulate better, less left leg pain, no significant side effect  noticed  UPDATE Feb 24th 2016: She responded to previous injection in November 2015, especially her left upper extremity, no significant side effect,  She has no recurrent seizure, taking Keppra 500 mg twice a day She has chronic insomnia, taking Ambien 5 mg 2 tablets every night  REVIEW OF SYSTEMS: Full 14 system review of systems performed and notable only for as above, ALLERGIES: Allergies  Allergen Reactions  . Celebrex [Celecoxib] Swelling    "lips; it was terrible"  . Baclofen Swelling    HOME MEDICATIONS: Current Outpatient Prescriptions on File Prior to Visit  Medication Sig Dispense Refill  . amitriptyline (ELAVIL) 25 MG tablet Take 75 mg by mouth at bedtime.    Marland Kitchen aspirin 81 MG chewable tablet Chew 1 tablet (81 mg total) by mouth daily. 30 tablet 3  . atorvastatin (LIPITOR) 20 MG tablet Take 20 mg by mouth daily.    . butalbital-acetaminophen-caffeine (FIORICET, ESGIC) 50-325-40 MG per tablet Take 1 tablet by mouth every 6 (six) hours as needed for headache. 15 tablet 5  . Cholecalciferol (VITAMIN D) 1000 UNITS capsule Take 1,000 Units by mouth daily.    . DULoxetine (CYMBALTA) 60 MG capsule Take 60 mg by mouth daily.    Marland Kitchen esomeprazole (NEXIUM) 40 MG capsule Take 40 mg by mouth 2 (two) times daily.    . furosemide (LASIX) 20 MG tablet Take 1 tablet (20 mg total) by mouth daily. 30 tablet 3  . irbesartan (AVAPRO) 75 MG tablet Take 1 tablet (75 mg total) by mouth daily. 30 tablet 3  . levETIRAcetam (KEPPRA) 500 MG tablet take 1 tablet by mouth twice a day 180 tablet 3  . metoprolol succinate (  TOPROL-XL) 25 MG 24 hr tablet Take 1 tablet (25 mg total) by mouth daily. 30 tablet 3  . naproxen (NAPROSYN) 500 MG tablet Take 1 tablet (500 mg total) by mouth 2 (two) times daily with a meal. 60 tablet 3  . OPANA ER, CRUSH RESISTANT, 40 MG T12A Take 1 tablet by mouth 2 (two) times daily.    Marland Kitchen oxybutynin (DITROPAN-XL) 10 MG 24 hr tablet Take 10 mg by mouth 2 (two) times daily.    Marland Kitchen  oxyCODONE (OXY IR/ROXICODONE) 5 MG immediate release tablet Take 5 mg by mouth every 8 (eight) hours as needed. For pain    . potassium chloride SA (K-DUR,KLOR-CON) 20 MEQ tablet Take 2 tablets (40 mEq total) by mouth daily. 60 tablet 0  . predniSONE (STERAPRED UNI-PAK) 10 MG tablet Prednisone dosing: Take  Prednisone 20mg  (2 tabs) x 3days, then 10mg  (1 tab) x 3 days, then prednisone 5 mg (1/2 tab) x 3 days, then OFF.  Dispense: 12  tabs, refills: None 12 tablet 0  . pregabalin (LYRICA) 75 MG capsule Take 75 mg by mouth 2 (two) times daily.    Marland Kitchen spironolactone (ALDACTONE) 25 MG tablet Take 1 tablet (25 mg total) by mouth daily. 30 tablet 3  . zolpidem (AMBIEN) 5 MG tablet take 1 tablet by mouth at bedtime if needed for sleep 30 tablet 5   No current facility-administered medications on file prior to visit.    PAST MEDICAL HISTORY: Past Medical History  Diagnosis Date  . Hypertension   . Hypercholesteremia   . Insomnia   . Neuropathy   . GERD (gastroesophageal reflux disease)   . Overactive bladder   . Peripheral vascular disease     left arm and leg; since stroke 1995  . Seizures 01/27/12    "first one ever"  . CVA (cerebrovascular accident) 1995    "left side paralyzed"  . Chronic pain     "in this left arm and leg that don't move"    PAST SURGICAL HISTORY: Past Surgical History  Procedure Laterality Date  . Tubal ligation  04/1984  . Cerebral aneurysm repair  11/1993    "left my left side paralyzed"    FAMILY HISTORY: Family History  Problem Relation Age of Onset  . Dementia Mother   . Diabetes type II Mother   . Heart attack Father   . Cancer Mother   . Diabetes Mother     SOCIAL HISTORY:  History   Social History  . Marital Status: Divorced    Spouse Name: N/A  . Number of Children: 2  . Years of Education: N/A   Occupational History  . Not on file.   Social History Main Topics  . Smoking status: Never Smoker   . Smokeless tobacco: Never Used  .  Alcohol Use: No  . Drug Use: No  . Sexual Activity: Yes   Other Topics Concern  . Not on file   Social History Narrative     PHYSICAL EXAM   Filed Vitals:   10/27/14 1327  BP: 136/79  Pulse: 86  Height: 5\' 1"  (1.549 m)  Weight: 168 lb (76.204 kg)    Not recorded      Body mass index is 31.76 kg/(m^2).   Generalized: In no acute distress  Neck: Supple, no carotid bruits   Cardiac: Regular rate rhythm  Pulmonary: Clear to auscultation bilaterally  Musculoskeletal: No deformity  Neurological examination  Mentation: Alert oriented to time, place, history taking, and causual conversation  Cranial nerve II-XII: Pupils were equal round reactive to light. Extraocular movements were full.  Left visual field cut.  Left lower face weakness. Hearing was intact to finger rubbing bilaterally. Uvula tongue midline.  Head turning were normal and symmetric.Tongue protrusion into cheek strength was normal.  Motor: Dense spastic left hemiparesis, left hand thumb in position, proximal and distal strength is 1 out of 5, left lower extremity 1 out of 5, left knee extension, ankle plantarflexion, when she ambulate, she complains of painful left toe flexion, left ankle plantarflexion  Sensory: Intact to fine touch, pinprick, preserved vibratory sensation, and proprioception at toes.  Coordination: Normal finger to nose, heel-to-shin bilaterally there was no truncal ataxia  Gait: Spastic left hemi-circumferential gait, unsteady  Deep tendon reflexes: Left hyperreflexia   DIAGNOSTIC DATA (LABS, IMAGING, TESTING) - I reviewed patient records, labs, notes, testing and imaging myself where available.  Lab Results  Component Value Date   WBC 19.0* 03/03/2013   HGB 10.5* 03/03/2013   HCT 32.2* 03/03/2013   MCV 78.0 03/03/2013   PLT 377 03/03/2013      Component Value Date/Time   NA 138 03/03/2013 1050   K 3.7 03/03/2013 1050   CL 103 03/03/2013 1050   CO2 23 03/03/2013 1050    GLUCOSE 111* 03/03/2013 1050   BUN 12 03/03/2013 1050   CREATININE 1.10 03/03/2013 1050   CALCIUM 9.3 03/03/2013 1050   PROT 6.3 02/22/2013 0430   ALBUMIN 2.6* 02/22/2013 0430   AST 21 02/22/2013 0430   ALT 23 02/22/2013 0430   ALKPHOS 93 02/22/2013 0430   BILITOT 0.1* 02/22/2013 0430   GFRNONAA 56* 03/03/2013 1050   GFRAA 65* 03/03/2013 1050   Lab Results  Component Value Date   CHOL 133 01/28/2012   HDL 38* 01/28/2012   LDLCALC 73 01/28/2012   TRIG 109 01/28/2012   CHOLHDL 3.5 01/28/2012   Lab Results  Component Value Date   HGBA1C 6.2* 01/28/2012   ASSESSMENT AND PLAN  ELLI GROESBECK is a 54 y.o. female complains of  right hemisphere hemorrhagic stroke, at age 27, 72 years ago, complex partial seizure, doing well with Keppra 500 mg twice a day,   spastic left hemiparesis, responded very well to previous Botox injection    Electrical stimulation guided Botox injection, I used 400 units of Botox A   Left tibialis posterior 25  Left flexor digitorum longus 25  Left flexor digitorum brevis 25 Left vastus lateralis medialis 12.5 Left rectus femoris 12.5  Left pronator teres 25 Left flexor carpi radialis 25 Left flexor carpi ulnaris 25  Left lumbricals injection, divided into the 2nd, 3rd, 4th lumbricals 25 units total Left adductor pollicis 25  Left palmaris longus 25 Left flexor digitorum profundi 25 Left flexor digitorum superficialis 25  Left pectoralis major 50 Left teres major 50  She tolerated the injection well, will return to clinic in 3 months for repeat injection   She has no recurrent seizure, continue Keppra 500 mg twice a day She complains of chronic insomnia, increase her Ambien to ER 12.5 mg every night     Marcial Pacas, M.D. Ph.D.  [facility] [address] [place], [place] [address] [phone]

## 2014-11-01 ENCOUNTER — Telehealth: Payer: Self-pay | Admitting: Neurology

## 2014-11-01 NOTE — Telephone Encounter (Signed)
Spoke to patient - she feels Ambien is not helping her insomnia - said she slept better when taking amitriptyline 100mg , one tab qhs.  She is requesting new rx for amitriptyline.

## 2014-11-01 NOTE — Telephone Encounter (Signed)
Pt is calling stating that zolpidem (AMBIEN CR) 12.5 MG CR tablet is not working for her and she wants to know if you would call in Amitriptyline 100mg  for her.  She uses [facility] on [address].  Please call and advise.

## 2014-11-01 NOTE — Telephone Encounter (Signed)
I have called [facility],   Ambien CR 12.5mg  did not work for her. Amitriptyline 25mg  4 tabs po qhs, works for her, I have suggested her contact primary care physician for refill,

## 2014-11-25 ENCOUNTER — Encounter: Payer: Self-pay | Admitting: Neurology

## 2014-11-25 ENCOUNTER — Telehealth: Payer: Self-pay | Admitting: Neurology

## 2014-11-25 NOTE — Telephone Encounter (Signed)
Letter from 11/25/14 was in error and not mailed out

## 2014-12-29 ENCOUNTER — Telehealth: Payer: Self-pay | Admitting: Neurology

## 2014-12-29 NOTE — Telephone Encounter (Signed)
Patient answered.called to see if she was right dominant or left dominant.

## 2015-01-21 ENCOUNTER — Telehealth: Payer: Self-pay | Admitting: Neurology

## 2015-01-21 NOTE — Telephone Encounter (Signed)
Made multiple attempts to contact patient on 5/19 and 5/20 to advise her to call the pharmacy to schedule shipment of her medication. No answer and unable to leave a message. Message states "hello no one is available to take your call, thank you for calling" and disconnects. As a result the appt for 5/25 is being cancelled due to patient not responding to the the speciality pharmacy and myself to get the medication delivered to the office.

## 2015-01-23 ENCOUNTER — Other Ambulatory Visit: Payer: Self-pay | Admitting: Neurology

## 2015-01-24 ENCOUNTER — Telehealth: Payer: Self-pay | Admitting: Neurology

## 2015-01-24 NOTE — Telephone Encounter (Signed)
I spoke with Heidi from [facility] in regards to scheduling a delivery for BOTOX 100 unit single dose. Will arrive on Tuesday 01/25/15 to our office address. Will be shipped from [employer] overnight.  Heidi can be reached @ [phone]

## 2015-01-26 ENCOUNTER — Ambulatory Visit: Payer: 59 | Admitting: Neurology

## 2015-02-10 ENCOUNTER — Ambulatory Visit: Payer: PRIVATE HEALTH INSURANCE | Admitting: Nurse Practitioner

## 2015-02-14 ENCOUNTER — Encounter: Payer: Self-pay | Admitting: Nurse Practitioner

## 2015-02-23 ENCOUNTER — Encounter: Payer: Self-pay | Admitting: *Deleted

## 2015-02-23 DIAGNOSIS — G8114 Spastic hemiplegia affecting left nondominant side: Secondary | ICD-10-CM

## 2015-02-24 ENCOUNTER — Ambulatory Visit (INDEPENDENT_AMBULATORY_CARE_PROVIDER_SITE_OTHER): Payer: Medicare Other | Admitting: Neurology

## 2015-02-24 ENCOUNTER — Encounter: Payer: Self-pay | Admitting: Neurology

## 2015-02-24 VITALS — BP 122/82 | HR 87 | Ht 61.0 in | Wt 181.0 lb

## 2015-02-24 DIAGNOSIS — G8114 Spastic hemiplegia affecting left nondominant side: Secondary | ICD-10-CM

## 2015-02-24 NOTE — Progress Notes (Signed)
**  Botox - Lot [id number], Exp 08/2017**mck,rn

## 2015-02-24 NOTE — Progress Notes (Signed)
PATIENT: Jordan Rivas DOB: 21-May-1961  HISTORICAL  Jordan Rivas is on 54 years old African American female, accompanied by her daughter for evaluation of seizure, left spastic hemiparesis  She had a history of right hemisphere hemorrhagic stroke due to brain aneurysm per patient at age 68, was residual severe left spastic hemiparesis, left visual field cut, profound gait difficulty,  She presented with her only seizure in Jan 26 2013, she woke up in the middle of the night using at home, while walking towards the bathroom, she fell, with witnessed seizure-like activity, tongue biting, woke up confused, at the hospital  MRI of the brain demonstrate extensive encephalomalacia involving right basal ganglion, perisylvian fissure region, no acute lesions, she has been treated with Keppra 500 mg twice a day, she has no recurrent seizure,  She continues to complain deep body aching pain at the left side, involving her left upper and lower extremity, specificity, difficulty straightening up her left finger, left leg spasm, she reported improvement of her left lower extremity spasm after receiving Botox injection in the past, but it was many years ago,  She also complains of couple years history of frequent headaches, getting worse almost daily basis, she has been taking frequent Excedrin migraines, left retro-orbital area severe pounding headache was associated light, noise sensitivity,  UPDATE July 1st 2015: Patient returned for EMG guided Botox injection for her spastic left upper, and the lower extremity, potential side effects explained, she wants the injection to emphasize on her left ankle plantarflexion, and left finger flexion  UPDATE Nov 26th 2015: She responded very well to her first EMG guided Botox injection in July first 2015, she received 400 units, she reported less spasticity of her left arm, left foot, ambulate better, less left leg pain, no significant side effect  noticed  UPDATE Feb 24th 2016: She responded to previous injection in November 2015, especially her left upper extremity, no significant side effect,  She has no recurrent seizure, taking Keppra 500 mg twice a day She has chronic insomnia, taking Ambien 5 mg 2 tablets every night  UPDATE February 24 2015: Last injection was in October 27 2014, she responded very well, she can loosen her left shoulder much better, no significant side effect  REVIEW OF SYSTEMS: Full 14 system review of systems performed and notable only for as above, ALLERGIES: Allergies  Allergen Reactions  . Celebrex [Celecoxib] Swelling    "lips; it was terrible"  . Baclofen Swelling    HOME MEDICATIONS: Current Outpatient Prescriptions on File Prior to Visit  Medication Sig Dispense Refill  . aspirin 81 MG chewable tablet Chew 1 tablet (81 mg total) by mouth daily. 30 tablet 3  . butalbital-acetaminophen-caffeine (FIORICET, ESGIC) 50-325-40 MG per tablet Take 1 tablet by mouth every 6 (six) hours as needed for headache. 15 tablet 5  . Cholecalciferol (VITAMIN D) 1000 UNITS capsule Take 1,000 Units by mouth daily.    . DULoxetine (CYMBALTA) 60 MG capsule Take 60 mg by mouth daily.    Marland Kitchen esomeprazole (NEXIUM) 40 MG capsule Take 40 mg by mouth 2 (two) times daily.    . furosemide (LASIX) 20 MG tablet Take 1 tablet (20 mg total) by mouth daily. 30 tablet 3  . irbesartan (AVAPRO) 75 MG tablet Take 1 tablet (75 mg total) by mouth daily. 30 tablet 3  . levETIRAcetam (KEPPRA) 500 MG tablet take 1 tablet by mouth twice a day 180 tablet 3  . metoprolol succinate (TOPROL-XL) 25 MG  24 hr tablet Take 1 tablet (25 mg total) by mouth daily. 30 tablet 3  . naproxen (NAPROSYN) 500 MG tablet Take 1 tablet (500 mg total) by mouth 2 (two) times daily with a meal. 60 tablet 3  . OnabotulinumtoxinA (BOTOX IJ) Inject 400 Units as directed.    Marland Kitchen oxybutynin (DITROPAN-XL) 10 MG 24 hr tablet Take 10 mg by mouth 2 (two) times daily.    Marland Kitchen  oxyCODONE (OXY IR/ROXICODONE) 5 MG immediate release tablet Take 5 mg by mouth every 8 (eight) hours as needed. For pain    . potassium chloride SA (K-DUR,KLOR-CON) 20 MEQ tablet Take 2 tablets (40 mEq total) by mouth daily. 60 tablet 0  . pregabalin (LYRICA) 75 MG capsule Take 75 mg by mouth 2 (two) times daily.    Marland Kitchen spironolactone (ALDACTONE) 25 MG tablet Take 1 tablet (25 mg total) by mouth daily. 30 tablet 3  . zolpidem (AMBIEN CR) 12.5 MG CR tablet Take 1 tablet (12.5 mg total) by mouth at bedtime as needed for sleep. 30 tablet 5   No current facility-administered medications on file prior to visit.    PAST MEDICAL HISTORY: Past Medical History  Diagnosis Date  . Hypertension   . Hypercholesteremia   . Insomnia   . Neuropathy   . GERD (gastroesophageal reflux disease)   . Overactive bladder   . Peripheral vascular disease     left arm and leg; since stroke 1995  . Seizures 01/27/12    "first one ever"  . CVA (cerebrovascular accident) 1995    "left side paralyzed"  . Chronic pain     "in this left arm and leg that don't move"    PAST SURGICAL HISTORY: Past Surgical History  Procedure Laterality Date  . Tubal ligation  04/1984  . Cerebral aneurysm repair  11/1993    "left my left side paralyzed"    FAMILY HISTORY: Family History  Problem Relation Age of Onset  . Dementia Mother   . Diabetes type II Mother   . Heart attack Father   . Cancer Mother   . Diabetes Mother     SOCIAL HISTORY:  History   Social History  . Marital Status: Divorced    Spouse Name: N/A  . Number of Children: 2  . Years of Education: N/A   Occupational History  . Not on file.   Social History Main Topics  . Smoking status: Never Smoker   . Smokeless tobacco: Never Used  . Alcohol Use: No  . Drug Use: No  . Sexual Activity: Yes   Other Topics Concern  . Not on file   Social History Narrative     PHYSICAL EXAM   Filed Vitals:   02/24/15 1036  BP: 122/82  Pulse: 87   Height: 5\' 1"  (1.549 m)  Weight: 181 lb (82.101 kg)    Not recorded      Body mass index is 34.22 kg/(m^2).   Generalized: In no acute distress  Neck: Supple, no carotid bruits   Cardiac: Regular rate rhythm  Pulmonary: Clear to auscultation bilaterally  Musculoskeletal: No deformity  Neurological examination  Mentation: Alert oriented to time, place, history taking, and causual conversation  Cranial nerve II-XII: Pupils were equal round reactive to light. Extraocular movements were full.  Left visual field cut.  Left lower face weakness. Hearing was intact to finger rubbing bilaterally. Uvula tongue midline.  Head turning were normal and symmetric.Tongue protrusion into cheek strength was normal.  Motor: Dense spastic  left hemiparesis, left hand thumb in position, proximal and distal strength is 1 out of 5, left lower extremity 1 out of 5, left knee extension, ankle plantarflexion, when she ambulate, she complains of painful left toe flexion, left ankle plantarflexion  Sensory: Intact to fine touch, pinprick, preserved vibratory sensation, and proprioception at toes.  Coordination: Normal finger to nose, heel-to-shin bilaterally there was no truncal ataxia  Gait: Spastic left hemi-circumferential gait, unsteady  Deep tendon reflexes: Left hyperreflexia   DIAGNOSTIC DATA (LABS, IMAGING, TESTING) - I reviewed patient records, labs, notes, testing and imaging myself where available.  Lab Results  Component Value Date   WBC 19.0* 03/03/2013   HGB 10.5* 03/03/2013   HCT 32.2* 03/03/2013   MCV 78.0 03/03/2013   PLT 377 03/03/2013      Component Value Date/Time   NA 138 03/03/2013 1050   K 3.7 03/03/2013 1050   CL 103 03/03/2013 1050   CO2 23 03/03/2013 1050   GLUCOSE 111* 03/03/2013 1050   BUN 12 03/03/2013 1050   CREATININE 1.10 03/03/2013 1050   CALCIUM 9.3 03/03/2013 1050   PROT 6.3 02/22/2013 0430   ALBUMIN 2.6* 02/22/2013 0430   AST 21 02/22/2013 0430    ALT 23 02/22/2013 0430   ALKPHOS 93 02/22/2013 0430   BILITOT 0.1* 02/22/2013 0430   GFRNONAA 56* 03/03/2013 1050   GFRAA 65* 03/03/2013 1050   Lab Results  Component Value Date   CHOL 133 01/28/2012   HDL 38* 01/28/2012   LDLCALC 73 01/28/2012   TRIG 109 01/28/2012   CHOLHDL 3.5 01/28/2012   Lab Results  Component Value Date   HGBA1C 6.2* 01/28/2012   ASSESSMENT AND PLAN  Jordan Rivas is a 54 y.o. female complains of  right hemisphere hemorrhagic stroke, at age 30, 1 years ago, complex partial seizure, doing well with Keppra 500 mg twice a day,   spastic left hemiparesis, responded very well to previous Botox injection    Electrical stimulation guided Botox injection, I used 400 units of Botox A   Left lumbricals injection, divided into the 2nd, 3rd, 4th lumbricals 25 units total Left adductor pollicis 25  Left palmaris longus 25 Left flexor digitorum profundi 25 Left flexor digitorum superficialis 25 Left flexor carpi ulnaris 25 Left pronator teres 25 Lef  Left pectoralis major 25 Left teres major 25x2=50 Left latissimus dorsi 25  Left tibialis posterior 25 x2=50 Left flexor digitorum longus 25x2=50 units.  left flexor digitorum brevis 25  She tolerated the injection well, will return to clinic in 3 months for repeat injection   She has no recurrent seizure, continue Keppra 500 mg twice a day   Marcial Pacas, M.D. Ph.D.  [facility] [address], [place], [place] [address] [phone]

## 2015-02-28 ENCOUNTER — Telehealth: Payer: Self-pay | Admitting: *Deleted

## 2015-02-28 NOTE — Telephone Encounter (Signed)
Received a call from [place] inquiring about what she should do about her pain medication which she is about to run out of.  She is not a patient @[facility]  currently and is in the review process.  I called her back and informed her that she must go to her previous prescriber to get medication and even if she is accepted as a patient in our clinic, we do not prescribe on the first visit.

## 2015-03-14 ENCOUNTER — Encounter: Payer: Self-pay | Admitting: Physical Medicine & Rehabilitation

## 2015-03-16 ENCOUNTER — Other Ambulatory Visit: Payer: Self-pay | Admitting: Neurology

## 2015-04-20 ENCOUNTER — Other Ambulatory Visit (HOSPITAL_COMMUNITY): Payer: Self-pay | Admitting: Family Medicine

## 2015-04-20 DIAGNOSIS — Z1231 Encounter for screening mammogram for malignant neoplasm of breast: Secondary | ICD-10-CM

## 2015-04-26 ENCOUNTER — Ambulatory Visit (HOSPITAL_COMMUNITY): Payer: Medicaid Other

## 2015-04-28 ENCOUNTER — Ambulatory Visit (HOSPITAL_COMMUNITY): Payer: Medicare Other | Attending: Family Medicine

## 2015-04-29 ENCOUNTER — Encounter: Payer: Medicaid Other | Attending: Physical Medicine & Rehabilitation

## 2015-04-29 ENCOUNTER — Ambulatory Visit: Payer: Medicaid Other | Admitting: Physical Medicine & Rehabilitation

## 2015-05-19 ENCOUNTER — Ambulatory Visit: Payer: Medicare Other

## 2015-05-26 ENCOUNTER — Ambulatory Visit: Payer: Medicare Other

## 2015-06-02 ENCOUNTER — Ambulatory Visit: Payer: Medicare Other

## 2015-06-22 ENCOUNTER — Ambulatory Visit (INDEPENDENT_AMBULATORY_CARE_PROVIDER_SITE_OTHER): Payer: Medicare Other | Admitting: Neurology

## 2015-06-22 ENCOUNTER — Encounter: Payer: Self-pay | Admitting: Neurology

## 2015-06-22 VITALS — BP 126/84 | HR 98 | Ht 61.0 in | Wt 182.0 lb

## 2015-06-22 DIAGNOSIS — G8114 Spastic hemiplegia affecting left nondominant side: Secondary | ICD-10-CM

## 2015-06-22 NOTE — Progress Notes (Signed)
PATIENT: Jordan Rivas DOB: [birth year]  HISTORICAL  Jordan Rivas is on 54 years old African American female, accompanied by her daughter for evaluation of seizure, left spastic hemiparesis  She had a history of right hemisphere hemorrhagic stroke due to brain aneurysm per patient at age 33, was residual severe left spastic hemiparesis, left visual field cut, profound gait difficulty,  She presented with her only seizure in Jan 26 2013, she woke up in the middle of the night using at home, while walking towards the bathroom, she fell, with witnessed seizure-like activity, tongue biting, woke up confused, at the hospital  MRI of the brain demonstrate extensive encephalomalacia involving right basal ganglion, perisylvian fissure region, no acute lesions, she has been treated with Keppra 500 mg twice a day, she has no recurrent seizure,  She continues to complain deep body aching pain at the left side, involving her left upper and lower extremity, specificity, difficulty straightening up her left finger, left leg spasm, she reported improvement of her left lower extremity spasm after receiving Botox injection in the past, but it was many years ago,  She also complains of couple years history of frequent headaches, getting worse almost daily basis, she has been taking frequent Excedrin migraines, left retro-orbital area severe pounding headache was associated light, noise sensitivity,  UPDATE July 1st 2015: Patient returned for EMG guided Botox injection for her spastic left upper, and the lower extremity, potential side effects explained, she wants the injection to emphasize on her left ankle plantarflexion, and left finger flexion  UPDATE Nov 26th 2015: She responded very well to her first EMG guided Botox injection in July first 2015, she received 400 units, she reported less spasticity of her left arm, left foot, ambulate better, less left leg pain, no significant side effect  noticed  UPDATE Feb 24th 2016: She responded to previous injection in November 2015, especially her left upper extremity, no significant side effect,  She has no recurrent seizure, taking Keppra 500 mg twice a day She has chronic insomnia, taking Ambien 5 mg 2 tablets every night  UPDATE February 24 2015: Last injection was in October 27 2014, she responded very well, she can loosen her left shoulder much better, no significant side effect  REVIEW OF SYSTEMS: Full 14 system review of systems performed and notable only for as above, ALLERGIES: Allergies  Allergen Reactions  . Celebrex [Celecoxib] Swelling    "lips; it was terrible"  . Baclofen Swelling    HOME MEDICATIONS: Current Outpatient Prescriptions on File Prior to Visit  Medication Sig Dispense Refill  . amitriptyline (ELAVIL) 100 MG tablet   0  . aspirin 81 MG chewable tablet Chew 1 tablet (81 mg total) by mouth daily. 30 tablet 3  . atorvastatin (LIPITOR) 40 MG tablet     . Cholecalciferol (VITAMIN D) 1000 UNITS capsule Take 1,000 Units by mouth daily.    . clotrimazole (MYCELEX) 10 MG troche     . DULoxetine (CYMBALTA) 60 MG capsule Take 60 mg by mouth daily.    Marland Kitchen esomeprazole (NEXIUM) 40 MG capsule Take 40 mg by mouth 2 (two) times daily.    . furosemide (LASIX) 20 MG tablet Take 1 tablet (20 mg total) by mouth daily. 30 tablet 3  . irbesartan (AVAPRO) 75 MG tablet Take 1 tablet (75 mg total) by mouth daily. 30 tablet 3  . levETIRAcetam (KEPPRA) 500 MG tablet take 1 tablet by mouth twice a day 180 tablet 3  .  metoprolol succinate (TOPROL-XL) 25 MG 24 hr tablet Take 1 tablet (25 mg total) by mouth daily. 30 tablet 3  . naproxen (NAPROSYN) 500 MG tablet take 1 tablet by mouth twice a day with food 60 tablet 3  . OnabotulinumtoxinA (BOTOX IJ) Inject 400 Units as directed.    . OPANA ER, CRUSH RESISTANT, 10 MG T12A 12 hr tablet Take 10 mg by mouth 2 (two) times daily.  0  . oxybutynin (DITROPAN-XL) 10 MG 24 hr tablet Take 10  mg by mouth 2 (two) times daily.    Marland Kitchen oxyCODONE (OXY IR/ROXICODONE) 5 MG immediate release tablet Take 5 mg by mouth every 8 (eight) hours as needed. For pain    . potassium chloride SA (K-DUR,KLOR-CON) 20 MEQ tablet Take 2 tablets (40 mEq total) by mouth daily. 60 tablet 0  . pregabalin (LYRICA) 75 MG capsule Take 75 mg by mouth 2 (two) times daily.    Marland Kitchen spironolactone (ALDACTONE) 25 MG tablet Take 1 tablet (25 mg total) by mouth daily. 30 tablet 3  . zolpidem (AMBIEN CR) 12.5 MG CR tablet Take 1 tablet (12.5 mg total) by mouth at bedtime as needed for sleep. 30 tablet 5   No current facility-administered medications on file prior to visit.    PAST MEDICAL HISTORY: Past Medical History  Diagnosis Date  . Hypertension   . Hypercholesteremia   . Insomnia   . Neuropathy ([place])   . GERD (gastroesophageal reflux disease)   . Overactive bladder   . Peripheral vascular disease ([place])     left arm and leg; since stroke 1995  . Seizures ([place]) 01/27/12    "first one ever"  . CVA (cerebrovascular accident) ([place]) 1995    "left side paralyzed"  . Chronic pain     "in this left arm and leg that don't move"    PAST SURGICAL HISTORY: Past Surgical History  Procedure Laterality Date  . Tubal ligation  04/1984  . Cerebral aneurysm repair  11/1993    "left my left side paralyzed"    FAMILY HISTORY: Family History  Problem Relation Age of Onset  . Dementia Mother   . Diabetes type II Mother   . Heart attack Father   . Cancer Mother   . Diabetes Mother     SOCIAL HISTORY:  Social History   Social History  . Marital Status: Divorced    Spouse Name: N/A  . Number of Children: 2  . Years of Education: N/A   Occupational History  . Not on file.   Social History Main Topics  . Smoking status: Never Smoker   . Smokeless tobacco: Never Used  . Alcohol Use: No  . Drug Use: No  . Sexual Activity: Yes   Other Topics Concern  . Not on file   Social History Narrative     PHYSICAL  EXAM   Filed Vitals:   06/22/15 1129  BP: 126/84  Pulse: 98  Height: 5\' 1"  (1.549 m)  Weight: 182 lb (82.555 kg)    Not recorded      Body mass index is 34.41 kg/(m^2).   Generalized: In no acute distress  Neck: Supple, no carotid bruits   Cardiac: Regular rate rhythm  Pulmonary: Clear to auscultation bilaterally  Musculoskeletal: No deformity  Neurological examination  Mentation: Alert oriented to time, place, history taking, and causual conversation  Cranial nerve II-XII: Pupils were equal round reactive to light. Extraocular movements were full.  Left visual field cut.  Left lower face  weakness. Hearing was intact to finger rubbing bilaterally. Uvula tongue midline.  Head turning were normal and symmetric.Tongue protrusion into cheek strength was normal.  Motor: Dense spastic left hemiparesis, left hand thumb in position, proximal and distal strength is 1 out of 5, left lower extremity 1 out of 5, left knee extension, ankle plantarflexion, when she ambulate, she complains of painful left toe flexion, left ankle plantarflexion  Sensory: Intact to fine touch, pinprick, preserved vibratory sensation, and proprioception at toes.  Coordination: Normal finger to nose, heel-to-shin bilaterally there was no truncal ataxia  Gait: Spastic left hemi-circumferential gait, unsteady  Deep tendon reflexes: Left hyperreflexia   DIAGNOSTIC DATA (LABS, IMAGING, TESTING) - I reviewed patient records, labs, notes, testing and imaging myself where available.  Lab Results  Component Value Date   WBC 19.0* 03/03/2013   HGB 10.5* 03/03/2013   HCT 32.2* 03/03/2013   MCV 78.0 03/03/2013   PLT 377 03/03/2013      Component Value Date/Time   NA 138 03/03/2013 1050   K 3.7 03/03/2013 1050   CL 103 03/03/2013 1050   CO2 23 03/03/2013 1050   GLUCOSE 111* 03/03/2013 1050   BUN 12 03/03/2013 1050   CREATININE 1.10 03/03/2013 1050   CALCIUM 9.3 03/03/2013 1050   PROT 6.3 02/22/2013  0430   ALBUMIN 2.6* 02/22/2013 0430   AST 21 02/22/2013 0430   ALT 23 02/22/2013 0430   ALKPHOS 93 02/22/2013 0430   BILITOT 0.1* 02/22/2013 0430   GFRNONAA 56* 03/03/2013 1050   GFRAA 65* 03/03/2013 1050   Lab Results  Component Value Date   CHOL 133 01/28/2012   HDL 38* 01/28/2012   LDLCALC 73 01/28/2012   TRIG 109 01/28/2012   CHOLHDL 3.5 01/28/2012   Lab Results  Component Value Date   HGBA1C 6.2* 01/28/2012   ASSESSMENT AND PLAN  Jordan Rivas is a 54 y.o. female complains of  right hemisphere hemorrhagic stroke, at age 36, 84 years ago, complex partial seizure, doing well with Keppra 500 mg twice a day,   spastic left hemiparesis, responded very well to previous Botox injection    Electrical stimulation guided Botox injection, I used 400 units of Botox A   Left lumbricals injection, divided into the 2nd, 3rd, 4th lumbricals 12.5 units total Left adductor pollicis 76.8 units  Left palmaris longus 25 Left flexor digitorum profundi 25 Left flexor digitorum superficialis 25 Left flexor carpi ulnaris 25 x2=50 Left pronator teres 25 Left brachialis 50 units Left biceps 37.5 units  Left pectoralis major 25 Left teres major 25   Left tibialis posterior 25 x2=50 Left flexor digitorum longus 25   left flexor digitorum brevis 25  She tolerated the injection well, will return to clinic in 3 months for repeat injection   She has no recurrent seizure, continue Keppra 500 mg twice a day   Marcial Pacas, M.D. Ph.D.  [facility] [address], [place], [place] [address] [phone]

## 2015-06-22 NOTE — Progress Notes (Signed)
**  Botox 100 units x 4, Lot#[id number], Exp 02/2018, [place] [id number], [facility]**mck,rn

## 2015-07-30 ENCOUNTER — Other Ambulatory Visit: Payer: Self-pay | Admitting: Neurology

## 2015-07-31 ENCOUNTER — Other Ambulatory Visit: Payer: Self-pay | Admitting: Neurology

## 2015-09-16 ENCOUNTER — Telehealth: Payer: Self-pay | Admitting: Neurology

## 2015-09-16 NOTE — Telephone Encounter (Signed)
Spoke with [facility] because patients botox did not arrive as scheduled today. The representative Sherren Mocha stated that there was an issue on their end and they were working to correct it. The medication will be delivered Tuesday morning before 12.

## 2015-09-21 ENCOUNTER — Ambulatory Visit (INDEPENDENT_AMBULATORY_CARE_PROVIDER_SITE_OTHER): Payer: Medicare Other | Admitting: Neurology

## 2015-09-21 ENCOUNTER — Encounter: Payer: Self-pay | Admitting: Neurology

## 2015-09-21 VITALS — BP 102/71 | HR 82 | Ht 61.0 in | Wt 191.0 lb

## 2015-09-21 DIAGNOSIS — G8114 Spastic hemiplegia affecting left nondominant side: Secondary | ICD-10-CM | POA: Diagnosis not present

## 2015-09-21 NOTE — Progress Notes (Signed)
PATIENT: Jordan Rivas DOB: 1960/12/26  HISTORICAL  Jordan Rivas is on 55 years old African American female, accompanied by her daughter for evaluation of seizure, left spastic hemiparesis  She had a history of right hemisphere hemorrhagic stroke due to brain aneurysm per patient at age 39, was residual severe left spastic hemiparesis, left visual field cut, profound gait difficulty,  She presented with her only seizure in Jan 26 2013, she woke up in the middle of the night using at home, while walking towards the bathroom, she fell, with witnessed seizure-like activity, tongue biting, woke up confused, at the hospital  MRI of the brain demonstrate extensive encephalomalacia involving right basal ganglion, perisylvian fissure region, no acute lesions, she has been treated with Keppra 500 mg twice a day, she has no recurrent seizure,  She continues to complain deep body aching pain at the left side, involving her left upper and lower extremity, specificity, difficulty straightening up her left finger, left leg spasm, she reported improvement of her left lower extremity spasm after receiving Botox injection in the past, but it was many years ago,  She also complains of couple years history of frequent headaches, getting worse almost daily basis, she has been taking frequent Excedrin migraines, left retro-orbital area severe pounding headache was associated light, noise sensitivity,  UPDATE July 1st 2015: Patient returned for EMG guided Botox injection for her spastic left upper, and the lower extremity, potential side effects explained, she wants the injection to emphasize on her left ankle plantarflexion, and left finger flexion  UPDATE Nov 26th 2015: She responded very well to her first EMG guided Botox injection in July first 2015, she received 400 units, she reported less spasticity of her left arm, left foot, ambulate better, less left leg pain, no significant side effect  noticed  UPDATE Feb 24th 2016: She responded to previous injection in November 2015, especially her left upper extremity, no significant side effect,  She has no recurrent seizure, taking Keppra 500 mg twice a day She has chronic insomnia, taking Ambien 5 mg 2 tablets every night  UPDATE February 24 2015: Last injection was in October 27 2014, she responded very well, she can loosen her left shoulder much better, no significant side effect  Update September 21 2015: She responded very well to previous EMG guided Botox injection, she can straight out her finger better, no significant side effect notice, no recurrent seizure taking Keppra 500 mg twice a day  REVIEW OF SYSTEMS: Full 14 system review of systems performed and notable only for as above, ALLERGIES: Allergies  Allergen Reactions  . Celebrex [Celecoxib] Swelling    "lips; it was terrible"  . Baclofen Swelling    HOME MEDICATIONS: Current Outpatient Prescriptions on File Prior to Visit  Medication Sig Dispense Refill  . amitriptyline (ELAVIL) 100 MG tablet   0  . aspirin 81 MG chewable tablet Chew 1 tablet (81 mg total) by mouth daily. 30 tablet 3  . atorvastatin (LIPITOR) 40 MG tablet     . Cholecalciferol (VITAMIN D) 1000 UNITS capsule Take 1,000 Units by mouth daily.    . clotrimazole (MYCELEX) 10 MG troche     . DULoxetine (CYMBALTA) 60 MG capsule Take 60 mg by mouth daily.    Marland Kitchen esomeprazole (NEXIUM) 40 MG capsule Take 40 mg by mouth 2 (two) times daily.    . furosemide (LASIX) 20 MG tablet Take 1 tablet (20 mg total) by mouth daily. 30 tablet 3  .  irbesartan (AVAPRO) 75 MG tablet Take 1 tablet (75 mg total) by mouth daily. 30 tablet 3  . levETIRAcetam (KEPPRA) 500 MG tablet take 1 tablet by mouth twice a day 180 tablet 3  . metoprolol succinate (TOPROL-XL) 25 MG 24 hr tablet Take 1 tablet (25 mg total) by mouth daily. 30 tablet 3  . naproxen (NAPROSYN) 500 MG tablet take 1 tablet by mouth twice a day with food 60 tablet 3   . naproxen (NAPROSYN) 500 MG tablet take 1 tablet by mouth twice a day with food 60 tablet 3  . OnabotulinumtoxinA (BOTOX IJ) Inject 400 Units as directed.    . OPANA ER, CRUSH RESISTANT, 10 MG T12A 12 hr tablet Take 10 mg by mouth 2 (two) times daily.  0  . oxybutynin (DITROPAN-XL) 10 MG 24 hr tablet Take 10 mg by mouth 2 (two) times daily.    Marland Kitchen oxyCODONE (OXY IR/ROXICODONE) 5 MG immediate release tablet Take 5 mg by mouth every 8 (eight) hours as needed. For pain    . potassium chloride SA (K-DUR,KLOR-CON) 20 MEQ tablet Take 2 tablets (40 mEq total) by mouth daily. 60 tablet 0  . pregabalin (LYRICA) 75 MG capsule Take 75 mg by mouth 2 (two) times daily.    Marland Kitchen spironolactone (ALDACTONE) 25 MG tablet Take 1 tablet (25 mg total) by mouth daily. 30 tablet 3  . zolpidem (AMBIEN CR) 12.5 MG CR tablet Take 1 tablet (12.5 mg total) by mouth at bedtime as needed for sleep. 30 tablet 5   No current facility-administered medications on file prior to visit.    PAST MEDICAL HISTORY: Past Medical History  Diagnosis Date  . Hypertension   . Hypercholesteremia   . Insomnia   . Neuropathy ([place])   . GERD (gastroesophageal reflux disease)   . Overactive bladder   . Peripheral vascular disease ([place])     left arm and leg; since stroke 1995  . Seizures ([place]) 01/27/12    "first one ever"  . CVA (cerebrovascular accident) ([place]) 1995    "left side paralyzed"  . Chronic pain     "in this left arm and leg that don't move"    PAST SURGICAL HISTORY: Past Surgical History  Procedure Laterality Date  . Tubal ligation  04/1984  . Cerebral aneurysm repair  11/1993    "left my left side paralyzed"    FAMILY HISTORY: Family History  Problem Relation Age of Onset  . Dementia Mother   . Diabetes type II Mother   . Heart attack Father   . Cancer Mother   . Diabetes Mother     SOCIAL HISTORY:  Social History   Social History  . Marital Status: Divorced    Spouse Name: N/A  . Number of Children: 2   . Years of Education: N/A   Occupational History  . Not on file.   Social History Main Topics  . Smoking status: Never Smoker   . Smokeless tobacco: Never Used  . Alcohol Use: No  . Drug Use: No  . Sexual Activity: Yes   Other Topics Concern  . Not on file   Social History Narrative     PHYSICAL EXAM   Filed Vitals:   09/21/15 1436  BP: 102/71  Pulse: 82  Height: 5\' 1"  (1.549 m)  Weight: 191 lb (86.637 kg)    Not recorded      Body mass index is 36.11 kg/(m^2).   Generalized: In no acute distress  Neck: Supple, no  carotid bruits   Cardiac: Regular rate rhythm  Pulmonary: Clear to auscultation bilaterally  Musculoskeletal: No deformity  Neurological examination  Mentation: Alert oriented to time, place, history taking, and causual conversation  Cranial nerve II-XII: Pupils were equal round reactive to light. Extraocular movements were full.  Left visual field cut.  Left lower face weakness. Hearing was intact to finger rubbing bilaterally. Uvula tongue midline.  Head turning were normal and symmetric.Tongue protrusion into cheek strength was normal.  Motor: Dense spastic left hemiparesis, left hand thumb in position, proximal and distal strength is 1 out of 5, left lower extremity 1 out of 5, left knee extension, ankle plantarflexion, when she ambulate, she complains of painful left toe flexion, left ankle plantarflexion  Sensory: Intact to fine touch, pinprick, preserved vibratory sensation, and proprioception at toes.  Coordination: Normal finger to nose, heel-to-shin bilaterally there was no truncal ataxia  Gait: Spastic left hemi-circumferential gait, unsteady  Deep tendon reflexes: Left hyperreflexia   DIAGNOSTIC DATA (LABS, IMAGING, TESTING) - I reviewed patient records, labs, notes, testing and imaging myself where available.  Lab Results  Component Value Date   WBC 19.0* 03/03/2013   HGB 10.5* 03/03/2013   HCT 32.2* 03/03/2013   MCV 78.0  03/03/2013   PLT 377 03/03/2013      Component Value Date/Time   NA 138 03/03/2013 1050   K 3.7 03/03/2013 1050   CL 103 03/03/2013 1050   CO2 23 03/03/2013 1050   GLUCOSE 111* 03/03/2013 1050   BUN 12 03/03/2013 1050   CREATININE 1.10 03/03/2013 1050   CALCIUM 9.3 03/03/2013 1050   PROT 6.3 02/22/2013 0430   ALBUMIN 2.6* 02/22/2013 0430   AST 21 02/22/2013 0430   ALT 23 02/22/2013 0430   ALKPHOS 93 02/22/2013 0430   BILITOT 0.1* 02/22/2013 0430   GFRNONAA 56* 03/03/2013 1050   GFRAA 65* 03/03/2013 1050   Lab Results  Component Value Date   CHOL 133 01/28/2012   HDL 38* 01/28/2012   LDLCALC 73 01/28/2012   TRIG 109 01/28/2012   CHOLHDL 3.5 01/28/2012   Lab Results  Component Value Date   HGBA1C 6.2* 01/28/2012   ASSESSMENT AND PLAN  Jordan Rivas is a 55 y.o. female complains of  right hemisphere hemorrhagic stroke, at age 42, 35 years ago, complex partial seizure, doing well with Keppra 500 mg twice a day,   spastic left hemiparesis, responded very well to previous Botox injection    Electrical stimulation guided Botox injection, I used 400 units of Botox A   Left lumbricals injection, divided into the 2nd, 3rd, 12.5 Left adductor pollicis [id number] units  Left palmaris longus 25 Left flexor digitorum profundi 25 Left flexor digitorum superficialis 25 Left flexor carpi ulnaris 25 Left pronator teres 25 Left brachialis 75 units Left biceps 25 units  Left pectoralis major 25 Left teres major 25   Left tibialis posterior 25 x2=50 Left flexor digitorum longus 25  Left flexor digitorum brevis 25  She tolerated the injection well, will return to clinic in 3 months for repeat injection   She has no recurrent seizure, continue Keppra 500 mg twice a day   Marcial Pacas, M.D. Ph.D.  [facility] [address], [place], [place] [address] [phone]

## 2015-09-21 NOTE — Progress Notes (Signed)
**  Botox 400units, Lot [id number], Exp 04/2018, [facility] [id number], Specialty Pharmacy**mck

## 2015-12-22 ENCOUNTER — Ambulatory Visit (INDEPENDENT_AMBULATORY_CARE_PROVIDER_SITE_OTHER): Payer: Medicare Other | Admitting: Neurology

## 2015-12-22 ENCOUNTER — Encounter: Payer: Self-pay | Admitting: Neurology

## 2015-12-22 VITALS — BP 116/71 | HR 96 | Ht 61.0 in | Wt 194.5 lb

## 2015-12-22 DIAGNOSIS — G8114 Spastic hemiplegia affecting left nondominant side: Secondary | ICD-10-CM | POA: Diagnosis not present

## 2015-12-22 NOTE — Progress Notes (Signed)
PATIENT: Jordan Rivas DOB: 17-Jul-1961  HISTORICAL  Jordan Rivas is on 55 years old African American female, accompanied by her daughter for evaluation of seizure, left spastic hemiparesis  She had a history of right hemisphere hemorrhagic stroke due to brain aneurysm per patient at age 42, was residual severe left spastic hemiparesis, left visual field cut, profound gait difficulty,  She presented with her only seizure in Jan 26 2013, she woke up in the middle of the night using at home, while walking towards the bathroom, she fell, with witnessed seizure-like activity, tongue biting, woke up confused, at the hospital  MRI of the brain demonstrate extensive encephalomalacia involving right basal ganglion, perisylvian fissure region, no acute lesions, she has been treated with Keppra 500 mg twice a day, she has no recurrent seizure,  She continues to complain deep body aching pain at the left side, involving her left upper and lower extremity, specificity, difficulty straightening up her left finger, left leg spasm, she reported improvement of her left lower extremity spasm after receiving Botox injection in the past, but it was many years ago,  She also complains of couple years history of frequent headaches, getting worse almost daily basis, she has been taking frequent Excedrin migraines, left retro-orbital area severe pounding headache was associated light, noise sensitivity,  UPDATE July 1st 2015: Patient returned for EMG guided Botox injection for her spastic left upper, and the lower extremity, potential side effects explained, she wants the injection to emphasize on her left ankle plantarflexion, and left finger flexion  UPDATE Nov 26th 2015: She responded very well to her first EMG guided Botox injection in July first 2015, she received 400 units, she reported less spasticity of her left arm, left foot, ambulate better, less left leg pain, no significant side effect  noticed  UPDATE Feb 24th 2016: She responded to previous injection in November 2015, especially her left upper extremity, no significant side effect,  She has no recurrent seizure, taking Keppra 500 mg twice a day She has chronic insomnia, taking Ambien 5 mg 2 tablets every night  UPDATE February 24 2015: Last injection was in October 27 2014, she responded very well, she can loosen her left shoulder much better, no significant side effect  Update September 21 2015: She responded very well to previous EMG guided Botox injection, she can straight out her finger better, no significant side effect notice, no recurrent seizure taking Keppra 500 mg twice a day  Update December 22 2015: She responded very well to previous injectionsignificant side effect noticed, she now complains left knee hyperextension left knee pain, finger flexion vomiting position, she has no recurrent seizure, now taking Keppra 500 mg twice a day,  REVIEW OF SYSTEMS: Full 14 system review of systems performed and notable only for as above, ALLERGIES: Allergies  Allergen Reactions  . Celebrex [Celecoxib] Swelling    "lips; it was terrible"  . Baclofen Swelling    HOME MEDICATIONS: Current Outpatient Prescriptions on File Prior to Visit  Medication Sig Dispense Refill  . amitriptyline (ELAVIL) 100 MG tablet   0  . aspirin 81 MG chewable tablet Chew 1 tablet (81 mg total) by mouth daily. 30 tablet 3  . atorvastatin (LIPITOR) 40 MG tablet     . Cholecalciferol (VITAMIN D) 1000 UNITS capsule Take 1,000 Units by mouth daily.    . clotrimazole (MYCELEX) 10 MG troche     . DULoxetine (CYMBALTA) 60 MG capsule Take 60 mg by mouth daily.    Marland Kitchen  esomeprazole (NEXIUM) 40 MG capsule Take 40 mg by mouth 2 (two) times daily.    . furosemide (LASIX) 20 MG tablet Take 1 tablet (20 mg total) by mouth daily. 30 tablet 3  . irbesartan (AVAPRO) 75 MG tablet Take 1 tablet (75 mg total) by mouth daily. 30 tablet 3  . levETIRAcetam (KEPPRA) 500  MG tablet take 1 tablet by mouth twice a day 180 tablet 3  . metoprolol succinate (TOPROL-XL) 25 MG 24 hr tablet Take 1 tablet (25 mg total) by mouth daily. 30 tablet 3  . naproxen (NAPROSYN) 500 MG tablet take 1 tablet by mouth twice a day with food 60 tablet 3  . naproxen (NAPROSYN) 500 MG tablet take 1 tablet by mouth twice a day with food 60 tablet 3  . OnabotulinumtoxinA (BOTOX IJ) Inject 400 Units as directed.    . OPANA ER, CRUSH RESISTANT, 10 MG T12A 12 hr tablet Take 10 mg by mouth 2 (two) times daily.  0  . oxybutynin (DITROPAN-XL) 10 MG 24 hr tablet Take 10 mg by mouth 2 (two) times daily.    Marland Kitchen oxyCODONE (OXY IR/ROXICODONE) 5 MG immediate release tablet Take 5 mg by mouth every 8 (eight) hours as needed. For pain    . potassium chloride SA (K-DUR,KLOR-CON) 20 MEQ tablet Take 2 tablets (40 mEq total) by mouth daily. 60 tablet 0  . pregabalin (LYRICA) 75 MG capsule Take 75 mg by mouth 2 (two) times daily.    Marland Kitchen spironolactone (ALDACTONE) 25 MG tablet Take 1 tablet (25 mg total) by mouth daily. 30 tablet 3  . zolpidem (AMBIEN CR) 12.5 MG CR tablet Take 1 tablet (12.5 mg total) by mouth at bedtime as needed for sleep. 30 tablet 5   No current facility-administered medications on file prior to visit.    PAST MEDICAL HISTORY: Past Medical History  Diagnosis Date  . Hypertension   . Hypercholesteremia   . Insomnia   . Neuropathy ([place])   . GERD (gastroesophageal reflux disease)   . Overactive bladder   . Peripheral vascular disease ([place])     left arm and leg; since stroke 1995  . Seizures ([place]) 01/27/12    "first one ever"  . CVA (cerebrovascular accident) ([place]) 1995    "left side paralyzed"  . Chronic pain     "in this left arm and leg that don't move"    PAST SURGICAL HISTORY: Past Surgical History  Procedure Laterality Date  . Tubal ligation  04/1984  . Cerebral aneurysm repair  11/1993    "left my left side paralyzed"    FAMILY HISTORY: Family History  Problem Relation  Age of Onset  . Dementia Mother   . Diabetes type II Mother   . Heart attack Father   . Cancer Mother   . Diabetes Mother     SOCIAL HISTORY:  Social History   Social History  . Marital Status: Divorced    Spouse Name: N/A  . Number of Children: 2  . Years of Education: N/A   Occupational History  . Not on file.   Social History Main Topics  . Smoking status: Never Smoker   . Smokeless tobacco: Never Used  . Alcohol Use: No  . Drug Use: No  . Sexual Activity: Yes   Other Topics Concern  . Not on file   Social History Narrative     PHYSICAL EXAM   Filed Vitals:   12/22/15 0851  BP: 116/71  Pulse: 96  Height:  5\' 1"  (1.549 m)  Weight: 194 lb 8 oz (88.225 kg)    Not recorded      Body mass index is 36.77 kg/(m^2).   Generalized: In no acute distress  Neck: Supple, no carotid bruits   Cardiac: Regular rate rhythm  Pulmonary: Clear to auscultation bilaterally  Musculoskeletal: No deformity  Neurological examination  Mentation: Alert oriented to time, place, history taking, and causual conversation  Cranial nerve II-XII: Pupils were equal round reactive to light. Extraocular movements were full.  Left visual field cut.  Left lower face weakness. Hearing was intact to finger rubbing bilaterally. Uvula tongue midline.  Head turning were normal and symmetric.Tongue protrusion into cheek strength was normal.  Motor: Dense spastic left hemiparesis, left hand thumb in position, proximal and distal strength is 1 out of 5, left lower extremity 1 out of 5, left knee extension, ankle plantarflexion, when she ambulate, she complains of painful left toe flexion, left ankle plantarflexion  Sensory: Intact to fine touch, pinprick, preserved vibratory sensation, and proprioception at toes.  Coordination: Normal finger to nose, heel-to-shin bilaterally there was no truncal ataxia  Gait: Spastic left hemi-circumferential gait, unsteady  Deep tendon reflexes: Left  hyperreflexia   DIAGNOSTIC DATA (LABS, IMAGING, TESTING) - I reviewed patient records, labs, notes, testing and imaging myself where available.  Lab Results  Component Value Date   WBC 19.0* 03/03/2013   HGB 10.5* 03/03/2013   HCT 32.2* 03/03/2013   MCV 78.0 03/03/2013   PLT 377 03/03/2013      Component Value Date/Time   NA 138 03/03/2013 1050   K 3.7 03/03/2013 1050   CL 103 03/03/2013 1050   CO2 23 03/03/2013 1050   GLUCOSE 111* 03/03/2013 1050   BUN 12 03/03/2013 1050   CREATININE 1.10 03/03/2013 1050   CALCIUM 9.3 03/03/2013 1050   PROT 6.3 02/22/2013 0430   ALBUMIN 2.6* 02/22/2013 0430   AST 21 02/22/2013 0430   ALT 23 02/22/2013 0430   ALKPHOS 93 02/22/2013 0430   BILITOT 0.1* 02/22/2013 0430   GFRNONAA 56* 03/03/2013 1050   GFRAA 65* 03/03/2013 1050   Lab Results  Component Value Date   CHOL 133 01/28/2012   HDL 38* 01/28/2012   LDLCALC 73 01/28/2012   TRIG 109 01/28/2012   CHOLHDL 3.5 01/28/2012   Lab Results  Component Value Date   HGBA1C 6.2* 01/28/2012   ASSESSMENT AND PLAN  Jordan Rivas is a 55 y.o. female complains of  right hemisphere hemorrhagic stroke, at age 22, 85 years ago, complex partial seizure, doing well with Keppra 500 mg twice a day,   spastic left hemiparesis, responded very well to previous Botox injection    Electrical stimulation guided Botox injection, I used 500 units of Botox A   Left upper extremity 250 units, left lower extremity 250 units  Left lumbricals injection, divided into the 2nd, 3rd, total of 25 Left adductor pollicis 25 units  Left palmaris longus 25 Left flexor digitorum profundi 25x2= 50 units  Left flexor digitorum superficialis 25 Left flexor carpi ulnaris 25 Left pronator teres 25x2= 50 units  Left Pronator quadratus 25 units    Left tibialis posterior 25 x2=50 Left flexor digitorum longus 25 x2= 50 units  Left flexor digitorum brevis 25 Left flexor pollicis longus 25 units  Left vastus  medialis 50 units  Left rectus femoris 25 units  Left adductor magnus 25 units  She tolerated the injection well, will return to clinic in 3 months for repeat  injection, need 500 units of BOTOX A  She has no recurrent seizure, continue Keppra 500 mg twice a day   Marcial Pacas, M.D. Ph.D.  [facility] [address], [place], [place] [address] [phone]

## 2015-12-22 NOTE — Progress Notes (Signed)
**  Botox 100 units x 5 vials, Lot [id number], Exp 07/2018, [place] [id number], specialty pharmacy.**mck,rn

## 2016-01-09 ENCOUNTER — Other Ambulatory Visit: Payer: Self-pay | Admitting: *Deleted

## 2016-01-09 MED ORDER — LEVETIRACETAM 500 MG PO TABS: 500.0000 mg | ORAL_TABLET | Freq: Two times a day (BID) | ORAL | Status: DC

## 2016-02-03 ENCOUNTER — Other Ambulatory Visit: Payer: Self-pay | Admitting: Advanced Practice Midwife

## 2016-02-03 DIAGNOSIS — Z1231 Encounter for screening mammogram for malignant neoplasm of breast: Secondary | ICD-10-CM

## 2016-02-13 ENCOUNTER — Inpatient Hospital Stay: Admission: RE | Admit: 2016-02-13 | Payer: Medicare Other | Source: Ambulatory Visit

## 2016-02-13 ENCOUNTER — Ambulatory Visit: Payer: Medicare Other

## 2016-02-27 ENCOUNTER — Other Ambulatory Visit: Payer: Self-pay | Admitting: *Deleted

## 2016-02-27 MED ORDER — NAPROXEN 500 MG PO TABS: 500.0000 mg | ORAL_TABLET | Freq: Two times a day (BID) | ORAL | Status: DC

## 2016-03-15 ENCOUNTER — Ambulatory Visit: Payer: Medicare Other | Admitting: Neurology

## 2016-03-26 ENCOUNTER — Telehealth: Payer: Self-pay | Admitting: Neurology

## 2016-03-26 ENCOUNTER — Telehealth: Payer: Self-pay | Admitting: *Deleted

## 2016-03-26 NOTE — Telephone Encounter (Signed)
error 

## 2016-03-26 NOTE — Telephone Encounter (Signed)
Pt medical records mailed to home address on 03/26/2016.

## 2016-03-27 ENCOUNTER — Inpatient Hospital Stay: Admission: RE | Admit: 2016-03-27 | Payer: Medicare Other | Source: Ambulatory Visit

## 2016-04-24 ENCOUNTER — Ambulatory Visit (INDEPENDENT_AMBULATORY_CARE_PROVIDER_SITE_OTHER): Payer: Medicare Other | Admitting: Neurology

## 2016-04-24 ENCOUNTER — Encounter: Payer: Self-pay | Admitting: Neurology

## 2016-04-24 VITALS — BP 111/71 | HR 96 | Ht 61.0 in | Wt 194.0 lb

## 2016-04-24 DIAGNOSIS — G8114 Spastic hemiplegia affecting left nondominant side: Secondary | ICD-10-CM

## 2016-04-24 NOTE — Progress Notes (Signed)
**  Botox 100 units x 5 vials, Lot [id number], Exp 11/2018, [place] [id number], specialty pharmacy, mck, rn.**

## 2016-04-24 NOTE — Progress Notes (Signed)
PATIENT: Jordan Rivas DOB: Jan 11, 1961  HISTORICAL  Jordan Rivas is on 55 years old African American female, accompanied by her daughter for evaluation of seizure, left spastic hemiparesis  She had a history of right hemisphere hemorrhagic stroke due to brain aneurysm per patient at age 21, was residual severe left spastic hemiparesis, left visual field cut, profound gait difficulty,  She presented with her only seizure in Jan 26 2013, she woke up in the middle of the night using at home, while walking towards the bathroom, she fell, with witnessed seizure-like activity, tongue biting, woke up confused, at the hospital  MRI of the brain demonstrate extensive encephalomalacia involving right basal ganglion, perisylvian fissure region, no acute lesions, she has been treated with Keppra 500 mg twice a day, she has no recurrent seizure,  She continues to complain deep body aching pain at the left side, involving her left upper and lower extremity, specificity, difficulty straightening up her left finger, left leg spasm, she reported improvement of her left lower extremity spasm after receiving Botox injection in the past, but it was many years ago,  She also complains of couple years history of frequent headaches, getting worse almost daily basis, she has been taking frequent Excedrin migraines, left retro-orbital area severe pounding headache was associated light, noise sensitivity,  UPDATE July 1st 2015: Patient returned for EMG guided Botox injection for her spastic left upper, and the lower extremity, potential side effects explained, she wants the injection to emphasize on her left ankle plantarflexion, and left finger flexion  UPDATE Nov 26th 2015: She responded very well to her first EMG guided Botox injection in July first 2015, she received 400 units, she reported less spasticity of her left arm, left foot, ambulate better, less left leg pain, no significant side effect  noticed  UPDATE Feb 24th 2016: She responded to previous injection in November 2015, especially her left upper extremity, no significant side effect,  She has no recurrent seizure, taking Keppra 500 mg twice a day She has chronic insomnia, taking Ambien 5 mg 2 tablets every night  UPDATE February 24 2015: Last injection was in October 27 2014, she responded very well, she can loosen her left shoulder much better, no significant side effect  Update September 21 2015: She responded very well to previous EMG guided Botox injection, she can straight out her finger better, no significant side effect notice, no recurrent seizure taking Keppra 500 mg twice a day  Update December 22 2015: She responded very well to previous injection significant side effect noticed, she now complains left knee hyperextension left knee pain, finger flexion vomiting position, she has no recurrent seizure, now taking Keppra 500 mg twice a day,  Update April 24 2016: She had no recurrent seizure, taking Keppra 500 mg twice a day, previous Botox ingestion has helped relaxed her shoulder, wrist, she complains of left  knee pain, tends to hyperextend her knee  REVIEW OF SYSTEMS: Full 14 system review of systems performed and notable only for as above, ALLERGIES: Allergies  Allergen Reactions  . Celebrex [Celecoxib] Swelling    "lips; it was terrible"  . Baclofen Swelling    HOME MEDICATIONS: Current Outpatient Prescriptions on File Prior to Visit  Medication Sig Dispense Refill  . amitriptyline (ELAVIL) 100 MG tablet   0  . aspirin 81 MG chewable tablet Chew 1 tablet (81 mg total) by mouth daily. 30 tablet 3  . atorvastatin (LIPITOR) 40 MG tablet     .  Cholecalciferol (VITAMIN D) 1000 UNITS capsule Take 1,000 Units by mouth daily.    . DULoxetine (CYMBALTA) 60 MG capsule Take 60 mg by mouth daily.    Marland Kitchen esomeprazole (NEXIUM) 40 MG capsule Take 40 mg by mouth 2 (two) times daily.    . furosemide (LASIX) 20 MG tablet  Take 1 tablet (20 mg total) by mouth daily. 30 tablet 3  . irbesartan (AVAPRO) 75 MG tablet Take 1 tablet (75 mg total) by mouth daily. 30 tablet 3  . levETIRAcetam (KEPPRA) 500 MG tablet Take 1 tablet (500 mg total) by mouth 2 (two) times daily. 180 tablet 3  . metoprolol succinate (TOPROL-XL) 25 MG 24 hr tablet Take 1 tablet (25 mg total) by mouth daily. 30 tablet 3  . naproxen (NAPROSYN) 500 MG tablet Take 1 tablet (500 mg total) by mouth 2 (two) times daily with a meal. 60 tablet 3  . OnabotulinumtoxinA (BOTOX IJ) Inject 400 Units as directed.    Marland Kitchen oxybutynin (DITROPAN-XL) 10 MG 24 hr tablet Take 10 mg by mouth 2 (two) times daily.    Marland Kitchen oxyCODONE (OXY IR/ROXICODONE) 5 MG immediate release tablet Take 5 mg by mouth every 8 (eight) hours as needed. For pain    . pregabalin (LYRICA) 75 MG capsule Take 75 mg by mouth 2 (two) times daily.    Marland Kitchen spironolactone (ALDACTONE) 25 MG tablet Take 1 tablet (25 mg total) by mouth daily. 30 tablet 3   No current facility-administered medications on file prior to visit.     PAST MEDICAL HISTORY: Past Medical History:  Diagnosis Date  . Chronic pain    "in this left arm and leg that don't move"  . CVA (cerebrovascular accident) ([place]) 1995   "left side paralyzed"  . GERD (gastroesophageal reflux disease)   . Hypercholesteremia   . Hypertension   . Insomnia   . Neuropathy ([place])   . Overactive bladder   . Peripheral vascular disease ([place])    left arm and leg; since stroke 1995  . Seizures ([place]) 01/27/12   "first one ever"    PAST SURGICAL HISTORY: Past Surgical History:  Procedure Laterality Date  . CEREBRAL ANEURYSM REPAIR  11/1993   "left my left side paralyzed"  . TUBAL LIGATION  04/1984    FAMILY HISTORY: Family History  Problem Relation Age of Onset  . Dementia Mother   . Diabetes type II Mother   . Heart attack Father   . Cancer Mother   . Diabetes Mother     SOCIAL HISTORY:  Social History   Social History  . Marital status:  Divorced    Spouse name: N/A  . Number of children: 2  . Years of education: N/A   Occupational History  . Not on file.   Social History Main Topics  . Smoking status: Never Smoker  . Smokeless tobacco: Never Used  . Alcohol use No  . Drug use: No  . Sexual activity: Yes   Other Topics Concern  . Not on file   Social History Narrative  . No narrative on file     PHYSICAL EXAM   Vitals:   04/24/16 1017  BP: 111/71  Pulse: 96  Weight: 194 lb (88 kg)  Height: 5\' 1"  (1.549 m)    Not recorded      Body mass index is 36.66 kg/m.   Generalized: In no acute distress  Neck: Supple, no carotid bruits   Cardiac: Regular rate rhythm  Pulmonary: Clear to auscultation  bilaterally  Musculoskeletal: No deformity  Neurological examination  Mentation: Alert oriented to time, place, history taking, and causual conversation  Cranial nerve II-XII: Pupils were equal round reactive to light. Extraocular movements were full.  Left visual field cut.  Left lower face weakness. Hearing was intact to finger rubbing bilaterally. Uvula tongue midline.  Head turning were normal and symmetric.Tongue protrusion into cheek strength was normal.  Motor: Dense spastic left hemiparesis, left hand thumb in position, proximal and distal strength is 1 out of 5, left lower extremity 1 out of 5, left knee extension, ankle plantarflexion, when she ambulate, she complains of painful left toe flexion, left ankle plantarflexion  Sensory: Intact to fine touch, pinprick, preserved vibratory sensation, and proprioception at toes.  Coordination: Normal finger to nose, heel-to-shin bilaterally there was no truncal ataxia  Gait: Spastic left hemi-circumferential gait, unsteady  Deep tendon reflexes: Left hyperreflexia   DIAGNOSTIC DATA (LABS, IMAGING, TESTING) - I reviewed patient records, labs, notes, testing and imaging myself where available.  Lab Results  Component Value Date   WBC 19.0 (H)  03/03/2013   HGB 10.5 (L) 03/03/2013   HCT 32.2 (L) 03/03/2013   MCV 78.0 03/03/2013   PLT 377 03/03/2013      Component Value Date/Time   NA 138 03/03/2013 1050   K 3.7 03/03/2013 1050   CL 103 03/03/2013 1050   CO2 23 03/03/2013 1050   GLUCOSE 111 (H) 03/03/2013 1050   BUN 12 03/03/2013 1050   CREATININE 1.10 03/03/2013 1050   CALCIUM 9.3 03/03/2013 1050   PROT 6.3 02/22/2013 0430   ALBUMIN 2.6 (L) 02/22/2013 0430   AST 21 02/22/2013 0430   ALT 23 02/22/2013 0430   ALKPHOS 93 02/22/2013 0430   BILITOT 0.1 (L) 02/22/2013 0430   GFRNONAA 56 (L) 03/03/2013 1050   GFRAA 65 (L) 03/03/2013 1050   Lab Results  Component Value Date   CHOL 133 01/28/2012   HDL 38 (L) 01/28/2012   LDLCALC 73 01/28/2012   TRIG 109 01/28/2012   CHOLHDL 3.5 01/28/2012   Lab Results  Component Value Date   HGBA1C 6.2 (H) 01/28/2012   ASSESSMENT AND PLAN  EDWARD MINDER is a 55 y.o. female complains of  right hemisphere hemorrhagic stroke, at age 64, 8 years ago, complex partial seizure, doing well with Keppra 500 mg twice a day,   spastic left hemiparesis, responded very well to previous Botox injection    Electrical stimulation guided Botox injection, I used 500 units of Botox A   Left upper extremity 400 units, left lower extremity 100 units  Left lumbricals injection, divided into the 2nd, 3rd, total of 25 Left adductor pollicis 25 units  Left palmaris longus 25 Left flexor digitorum profundi 25x2= 50 units  Left flexor digitorum superficialis 25  Left flexor carpi radialis 25 units Left flexor carpi ulnaris 25x2=50 unit  Left pronator teres 25x2= 50 units  Left pronator quadratus 25 units   Left pectoralis major 75 units Left teres major 25 units Left latissimus dorsi 50 units   Left tibialis posterior 25  Left flexor digitorum longus 25 Left flexor digitorum brevis 25 Left flexor pollicis longus 25 units  She tolerated the injection well, will return to clinic in 3  months for repeat injection, need 500 units of BOTOX A  She has no recurrent seizure, continue Keppra 500 mg twice a day   Marcial Pacas, M.D. Ph.D.  [facility] [address] [place], [place] [address] [phone]  [phone] 

## 2016-06-19 ENCOUNTER — Other Ambulatory Visit: Payer: Self-pay | Admitting: Neurology

## 2016-06-20 ENCOUNTER — Ambulatory Visit: Payer: Medicare Other

## 2016-07-05 ENCOUNTER — Ambulatory Visit: Payer: Medicare Other

## 2016-07-05 ENCOUNTER — Ambulatory Visit
Admission: RE | Admit: 2016-07-05 | Discharge: 2016-07-05 | Disposition: A | Discharge status: Home / Self Care | Payer: Medicare Other | Source: Ambulatory Visit | Attending: Advanced Practice Midwife | Admitting: Advanced Practice Midwife

## 2016-07-05 DIAGNOSIS — Z1231 Encounter for screening mammogram for malignant neoplasm of breast: Secondary | ICD-10-CM

## 2016-07-25 ENCOUNTER — Telehealth: Payer: Self-pay | Admitting: Neurology

## 2016-07-25 NOTE — Telephone Encounter (Signed)
[facility] called to speak with someone about PA on botox. May call [phone]

## 2016-07-30 NOTE — Telephone Encounter (Signed)
Called and asked patient to call and give consent for medication. She stated that she would.

## 2016-08-01 ENCOUNTER — Ambulatory Visit (INDEPENDENT_AMBULATORY_CARE_PROVIDER_SITE_OTHER): Payer: Medicare Other | Admitting: Neurology

## 2016-08-01 ENCOUNTER — Encounter: Payer: Self-pay | Admitting: Neurology

## 2016-08-01 VITALS — BP 123/76 | HR 104 | Ht 61.0 in | Wt 197.0 lb

## 2016-08-01 DIAGNOSIS — IMO0002 Reserved for concepts with insufficient information to code with codable children: Secondary | ICD-10-CM

## 2016-08-01 DIAGNOSIS — G8114 Spastic hemiplegia affecting left nondominant side: Secondary | ICD-10-CM

## 2016-08-01 MED ORDER — PREGABALIN 75 MG PO CAPS: 75.0000 mg | ORAL_CAPSULE | Freq: Three times a day (TID) | ORAL | 5 refills | Status: DC

## 2016-08-01 MED ORDER — TIZANIDINE HCL 2 MG PO TABS: 4.0000 mg | ORAL_TABLET | Freq: Three times a day (TID) | ORAL | 11 refills | Status: DC

## 2016-08-01 NOTE — Progress Notes (Signed)
PATIENT: Jordan Rivas DOB: [birth year]  HISTORICAL  Jordan Rivas is on 55 years old African American female, accompanied by her daughter for evaluation of seizure, left spastic hemiparesis  She had a history of right hemisphere hemorrhagic stroke due to brain aneurysm per patient at age 35, was residual severe left spastic hemiparesis, left visual field cut, profound gait difficulty,  She presented with her only seizure in Jan 26 2013, she woke up in the middle of the night using at home, while walking towards the bathroom, she fell, with witnessed seizure-like activity, tongue biting, woke up confused, at the hospital  MRI of the brain demonstrate extensive encephalomalacia involving right basal ganglion, perisylvian fissure region, no acute lesions, she has been treated with Keppra 500 mg twice a day, she has no recurrent seizure,  She continues to complain deep body aching pain at the left side, involving her left upper and lower extremity, specificity, difficulty straightening up her left finger, left leg spasm, she reported improvement of her left lower extremity spasm after receiving Botox injection in the past, but it was many years ago,  She also complains of couple years history of frequent headaches, getting worse almost daily basis, she has been taking frequent Excedrin migraines, left retro-orbital area severe pounding headache was associated light, noise sensitivity,  UPDATE July 1st 2015: Patient returned for EMG guided Botox injection for her spastic left upper, and the lower extremity, potential side effects explained, she wants the injection to emphasize on her left ankle plantarflexion, and left finger flexion  UPDATE Nov 26th 2015: She responded very well to her first EMG guided Botox injection in July first 2015, she received 400 units, she reported less spasticity of her left arm, left foot, ambulate better, less left leg pain, no significant side effect  noticed  UPDATE Feb 24th 2016: She responded to previous injection in November 2015, especially her left upper extremity, no significant side effect,  She has no recurrent seizure, taking Keppra 500 mg twice a day She has chronic insomnia, taking Ambien 5 mg 2 tablets every night  UPDATE February 24 2015: Last injection was in October 27 2014, she responded very well, she can loosen her left shoulder much better, no significant side effect  Update September 21 2015: She responded very well to previous EMG guided Botox injection, she can straight out her finger better, no significant side effect notice, no recurrent seizure taking Keppra 500 mg twice a day  Update December 22 2015: She responded very well to previous injection significant side effect noticed, she now complains left knee hyperextension left knee pain, finger flexion vomiting position, she has no recurrent seizure, now taking Keppra 500 mg twice a day,  Update April 24 2016: She had no recurrent seizure, taking Keppra 500 mg twice a day, previous Botox ingestion has helped relaxed her shoulder, wrist, she complains of left  knee pain, tends to hyperextend her knee  UPDATE Nov 29th 2017: She responded well to previous injection but continue have significant left shoulder tightness, left foot tightness, especially left toes crunch underneath while ambulating  REVIEW OF SYSTEMS: Full 14 system review of systems performed and notable only for as above, ALLERGIES: Allergies  Allergen Reactions  . Celebrex [Celecoxib] Swelling    "lips; it was terrible"  . Baclofen Swelling    HOME MEDICATIONS: Current Outpatient Prescriptions on File Prior to Visit  Medication Sig Dispense Refill  . amitriptyline (ELAVIL) 100 MG tablet   0  .  aspirin 81 MG chewable tablet Chew 1 tablet (81 mg total) by mouth daily. 30 tablet 3  . atorvastatin (LIPITOR) 40 MG tablet     . Cholecalciferol (VITAMIN D) 1000 UNITS capsule Take 1,000 Units by mouth  daily.    . DULoxetine (CYMBALTA) 60 MG capsule Take 60 mg by mouth daily.    Marland Kitchen esomeprazole (NEXIUM) 40 MG capsule Take 40 mg by mouth 2 (two) times daily.    . irbesartan (AVAPRO) 75 MG tablet Take 1 tablet (75 mg total) by mouth daily. 30 tablet 3  . levETIRAcetam (KEPPRA) 500 MG tablet Take 1 tablet (500 mg total) by mouth 2 (two) times daily. 180 tablet 3  . metoprolol succinate (TOPROL-XL) 25 MG 24 hr tablet Take 1 tablet (25 mg total) by mouth daily. 30 tablet 3  . naproxen (NAPROSYN) 500 MG tablet take 1 tablet by mouth twice a day with food 60 tablet 3  . OnabotulinumtoxinA (BOTOX IJ) Inject 400 Units as directed.    Marland Kitchen oxybutynin (DITROPAN-XL) 10 MG 24 hr tablet Take 10 mg by mouth 2 (two) times daily.    Marland Kitchen oxyCODONE (OXY IR/ROXICODONE) 5 MG immediate release tablet Take 5 mg by mouth every 8 (eight) hours as needed. For pain    . pregabalin (LYRICA) 75 MG capsule Take 75 mg by mouth 2 (two) times daily.    Marland Kitchen spironolactone (ALDACTONE) 25 MG tablet Take 1 tablet (25 mg total) by mouth daily. 30 tablet 3   No current facility-administered medications on file prior to visit.     PAST MEDICAL HISTORY: Past Medical History:  Diagnosis Date  . Chronic pain    "in this left arm and leg that don't move"  . CVA (cerebrovascular accident) ([place]) 1995   "left side paralyzed"  . GERD (gastroesophageal reflux disease)   . Hypercholesteremia   . Hypertension   . Insomnia   . Neuropathy ([place])   . Overactive bladder   . Peripheral vascular disease ([place])    left arm and leg; since stroke 1995  . Seizures ([place]) 01/27/12   "first one ever"    PAST SURGICAL HISTORY: Past Surgical History:  Procedure Laterality Date  . CEREBRAL ANEURYSM REPAIR  11/1993   "left my left side paralyzed"  . TUBAL LIGATION  04/1984    FAMILY HISTORY: Family History  Problem Relation Age of Onset  . Dementia Mother   . Diabetes type II Mother   . Heart attack Father   . Cancer Mother   . Diabetes Mother      SOCIAL HISTORY:  Social History   Social History  . Marital status: Divorced    Spouse name: N/A  . Number of children: 2  . Years of education: N/A   Occupational History  . Not on file.   Social History Main Topics  . Smoking status: Never Smoker  . Smokeless tobacco: Never Used  . Alcohol use No  . Drug use: No  . Sexual activity: Yes   Other Topics Concern  . Not on file   Social History Narrative  . No narrative on file     PHYSICAL EXAM   Vitals:   08/01/16 1333  BP: 123/76  Pulse: (!) 104  Weight: 197 lb (89.4 kg)  Height: 5\' 1"  (1.549 m)    Not recorded      Body mass index is 37.22 kg/m.   Generalized: In no acute distress  Neck: Supple, no carotid bruits   Cardiac: Regular rate  rhythm  Pulmonary: Clear to auscultation bilaterally  Musculoskeletal: No deformity  Neurological examination  Mentation: Alert oriented to time, place, history taking, and causual conversation  Cranial nerve II-XII: Pupils were equal round reactive to light. Extraocular movements were full.  Left visual field cut.  Left lower face weakness. Hearing was intact to finger rubbing bilaterally. Uvula tongue midline.  Head turning were normal and symmetric.Tongue protrusion into cheek strength was normal.  Motor: Dense spastic left hemiparesis, left hand thumb in position, proximal and distal strength is 1 out of 5, left lower extremity 1 out of 5, left knee extension, ankle plantarflexion, when she ambulate, she complains of painful left toe flexion, left ankle plantarflexion  Sensory: Intact to fine touch, pinprick, preserved vibratory sensation, and proprioception at toes.  Coordination: Normal finger to nose, heel-to-shin bilaterally there was no truncal ataxia  Gait: Spastic left hemi-circumferential gait, unsteady  Deep tendon reflexes: Left hyperreflexia   DIAGNOSTIC DATA (LABS, IMAGING, TESTING) - I reviewed patient records, labs, notes, testing and  imaging myself where available.  Lab Results  Component Value Date   WBC 19.0 (H) 03/03/2013   HGB 10.5 (L) 03/03/2013   HCT 32.2 (L) 03/03/2013   MCV 78.0 03/03/2013   PLT 377 03/03/2013      Component Value Date/Time   NA 138 03/03/2013 1050   K 3.7 03/03/2013 1050   CL 103 03/03/2013 1050   CO2 23 03/03/2013 1050   GLUCOSE 111 (H) 03/03/2013 1050   BUN 12 03/03/2013 1050   CREATININE 1.10 03/03/2013 1050   CALCIUM 9.3 03/03/2013 1050   PROT 6.3 02/22/2013 0430   ALBUMIN 2.6 (L) 02/22/2013 0430   AST 21 02/22/2013 0430   ALT 23 02/22/2013 0430   ALKPHOS 93 02/22/2013 0430   BILITOT 0.1 (L) 02/22/2013 0430   GFRNONAA 56 (L) 03/03/2013 1050   GFRAA 65 (L) 03/03/2013 1050   Lab Results  Component Value Date   CHOL 133 01/28/2012   HDL 38 (L) 01/28/2012   LDLCALC 73 01/28/2012   TRIG 109 01/28/2012   CHOLHDL 3.5 01/28/2012   Lab Results  Component Value Date   HGBA1C 6.2 (H) 01/28/2012   ASSESSMENT AND PLAN  Jordan Rivas is a 55 y.o. female complains of  right hemisphere hemorrhagic stroke, at age 2, 7 years ago, complex partial seizure, doing well with Keppra 500 mg twice a day,   spastic left hemiparesis, responded very well to previous Botox injection    Electrical stimulation guided Botox injection, I used 500 units of Botox A   Left upper extremity 300 units, left lower extremity 200 units  Left lumbricals injection, divided into the 2nd, 3rd, total of 25   Left palmaris longus 25 Left flexor digitorum profundi 25x2= 50 units  units  Left flexor digitorum superficialis 25  Left flexor carpi ulnaris 25  unit Left pronator teres 25  units  Left pronator quadratus 25 units    Left pectoralis major 50 units Left teres major 25 units Left latissimus dorsi 25 units  Left adductor longus 25 Left rectus femoris 25 units Left flexor digitorum brevis 25 units Left flexor hallucis longus 25 units  Left tibialis posterior 25  Left flexor digitorum  longus 25 Left flexor digitorum brevis 25 Left flexor pollicis longus 25 units  She tolerated the injection well, will return to clinic in 3 months for repeat injection, need 500 units of BOTOX A  She has no recurrent seizure, continue Keppra 500 mg twice a  day Add on tizanidine 2 mg 3 times a day   Marcial Pacas, M.D. Ph.D.  [facility] [address] [place], [place] [address] [phone]

## 2016-08-01 NOTE — Progress Notes (Signed)
**  Botox 100 units x 5 vials, Lot [id number], Exp 02/2019, [place] [id number], specialty pharmacy.//mck,rn**

## 2016-08-02 ENCOUNTER — Ambulatory Visit: Payer: Medicare Other | Admitting: Neurology

## 2016-10-04 ENCOUNTER — Other Ambulatory Visit: Payer: Self-pay | Admitting: Neurology

## 2016-10-31 ENCOUNTER — Ambulatory Visit: Payer: Medicare Other | Admitting: Neurology

## 2016-11-27 ENCOUNTER — Telehealth: Payer: Self-pay | Admitting: Neurology

## 2016-11-27 NOTE — Telephone Encounter (Signed)
Danae Chen with appeals dept at [facility] [phone] is wanting to know when Chales Abrahams will fax the form back since this is an expedited request. I confirmed the fax # that she sent the form to.

## 2016-11-28 ENCOUNTER — Ambulatory Visit: Payer: Medicare Other | Admitting: Neurology

## 2016-11-28 NOTE — Telephone Encounter (Signed)
[name] with [facility] appeals is stating Botox has been approved for the patient from 11-23-16 through 02-28-17.

## 2016-12-05 ENCOUNTER — Ambulatory Visit: Payer: Medicare Other | Admitting: Neurology

## 2016-12-05 ENCOUNTER — Telehealth: Payer: Self-pay | Admitting: Neurology

## 2016-12-05 NOTE — Telephone Encounter (Signed)
Patient called office in reference to rescheduling Botox appointment this afternoon.  Please call

## 2016-12-06 NOTE — Telephone Encounter (Signed)
I called and scheduled the patient for her injections.

## 2016-12-18 ENCOUNTER — Telehealth: Payer: Self-pay | Admitting: Neurology

## 2016-12-18 NOTE — Telephone Encounter (Signed)
Mickel Baas with [facility] requesting refill of levETIRAcetam (KEPPRA) 500 MG tablet, pregabalin (LYRICA) 75 MG capsule, tiZANidine (ZANAFLEX) 2 MG tablet,naproxen (NAPROSYN) 500 MG tablet faxed to [phone].

## 2016-12-19 ENCOUNTER — Other Ambulatory Visit: Payer: Self-pay | Admitting: *Deleted

## 2016-12-19 MED ORDER — TIZANIDINE HCL 2 MG PO TABS
4.0000 mg | ORAL_TABLET | Freq: Three times a day (TID) | ORAL | 3 refills | Status: DC
Start: 2016-12-19 — End: 2017-11-20

## 2016-12-19 MED ORDER — PREGABALIN 75 MG PO CAPS: 75.0000 mg | ORAL_CAPSULE | Freq: Three times a day (TID) | ORAL | 1 refills | Status: DC

## 2016-12-19 MED ORDER — NAPROXEN 500 MG PO TABS: 500.0000 mg | ORAL_TABLET | Freq: Two times a day (BID) | ORAL | 1 refills | Status: DC

## 2016-12-19 MED ORDER — LEVETIRACETAM 500 MG PO TABS: 500.0000 mg | ORAL_TABLET | Freq: Two times a day (BID) | ORAL | 3 refills | Status: DC

## 2016-12-19 NOTE — Telephone Encounter (Signed)
Prescriptions printed, signed, faxed and confirmed to [facility].

## 2016-12-26 ENCOUNTER — Encounter (INDEPENDENT_AMBULATORY_CARE_PROVIDER_SITE_OTHER): Payer: Self-pay

## 2016-12-26 ENCOUNTER — Ambulatory Visit (INDEPENDENT_AMBULATORY_CARE_PROVIDER_SITE_OTHER): Payer: Medicare Other | Admitting: Neurology

## 2016-12-26 ENCOUNTER — Encounter: Payer: Self-pay | Admitting: Neurology

## 2016-12-26 ENCOUNTER — Telehealth: Payer: Self-pay | Admitting: Neurology

## 2016-12-26 VITALS — BP 135/81 | HR 104 | Ht 61.0 in | Wt 194.0 lb

## 2016-12-26 DIAGNOSIS — G8114 Spastic hemiplegia affecting left nondominant side: Secondary | ICD-10-CM

## 2016-12-26 DIAGNOSIS — I69354 Hemiplegia and hemiparesis following cerebral infarction affecting left non-dominant side: Secondary | ICD-10-CM

## 2016-12-26 NOTE — Telephone Encounter (Signed)
Patient kept her curtain apt and came in for it today.

## 2016-12-26 NOTE — Progress Notes (Signed)
**  Botox 100 units x 5 vials, NDC 6979-4801-65, Lot [id number], Exp 06/2019, specialty pharmacy.//mck,rn**

## 2016-12-26 NOTE — Progress Notes (Signed)
PATIENT: Jordan Rivas DOB: June 28, 1961  HISTORICAL  Jordan Rivas is on 56 years old African American female, accompanied by her daughter for evaluation of seizure, left spastic hemiparesis  She had a history of right hemisphere hemorrhagic stroke due to brain aneurysm per patient at age 14, was residual severe left spastic hemiparesis, left visual field cut, profound gait difficulty,  She presented with her only seizure in Jan 26 2013, she woke up in the middle of the night using at home, while walking towards the bathroom, she fell, with witnessed seizure-like activity, tongue biting, woke up confused, at the hospital  MRI of the brain demonstrate extensive encephalomalacia involving right basal ganglion, perisylvian fissure region, no acute lesions, she has been treated with Keppra 500 mg twice a day, she has no recurrent seizure,  She continues to complain deep body aching pain at the left side, involving her left upper and lower extremity, specificity, difficulty straightening up her left finger, left leg spasm, she reported improvement of her left lower extremity spasm after receiving Botox injection in the past, but it was many years ago,  She also complains of couple years history of frequent headaches, getting worse almost daily basis, she has been taking frequent Excedrin migraines, left retro-orbital area severe pounding headache was associated light, noise sensitivity,  UPDATE July 1st 2015: Patient returned for EMG guided Botox injection for her spastic left upper, and the lower extremity, potential side effects explained, she wants the injection to emphasize on her left ankle plantarflexion, and left finger flexion  UPDATE Nov 26th 2015: She responded very well to her first EMG guided Botox injection in July first 2015, she received 400 units, she reported less spasticity of her left arm, left foot, ambulate better, less left leg pain, no significant side effect  noticed  UPDATE Feb 24th 2016: She responded to previous injection in November 2015, especially her left upper extremity, no significant side effect,  She has no recurrent seizure, taking Keppra 500 mg twice a day She has chronic insomnia, taking Ambien 5 mg 2 tablets every night  UPDATE February 24 2015: Last injection was in October 27 2014, she responded very well, she can loosen her left shoulder much better, no significant side effect  Update September 21 2015: She responded very well to previous EMG guided Botox injection, she can straight out her finger better, no significant side effect notice, no recurrent seizure taking Keppra 500 mg twice a day  Update December 22 2015: She responded very well to previous injection significant side effect noticed, she now complains left knee hyperextension left knee pain, finger flexion vomiting position, she has no recurrent seizure, now taking Keppra 500 mg twice a day,  Update April 24 2016: She had no recurrent seizure, taking Keppra 500 mg twice a day, previous Botox ingestion has helped relaxed her shoulder, wrist, she complains of left  knee pain, tends to hyperextend her knee  UPDATE Nov 29th 2017: She responded well to previous injection but continue have significant left shoulder tightness, left foot tightness, especially left toes crunch underneath while ambulating  Update December 26 2016: She responded very well to previous Botox injection but his 5 month out from previous injection in November 2017, she noticed significant tightness of left shoulder, left forearm in position, left ankle plantarflexion, she had no recurrent seizure, tizanidine as needed has been helpful  REVIEW OF SYSTEMS: Full 14 system review of systems performed and notable only for as above, ALLERGIES:  Allergies  Allergen Reactions  . Celebrex [Celecoxib] Swelling    "lips; it was terrible"  . Baclofen Swelling    HOME MEDICATIONS: Current Outpatient Prescriptions  on File Prior to Visit  Medication Sig Dispense Refill  . amitriptyline (ELAVIL) 100 MG tablet   0  . aspirin 81 MG chewable tablet Chew 1 tablet (81 mg total) by mouth daily. 30 tablet 3  . atorvastatin (LIPITOR) 40 MG tablet     . Cholecalciferol (VITAMIN D) 1000 UNITS capsule Take 1,000 Units by mouth daily.    . DULoxetine (CYMBALTA) 60 MG capsule Take 60 mg by mouth daily.    Marland Kitchen esomeprazole (NEXIUM) 40 MG capsule Take 40 mg by mouth 2 (two) times daily.    . irbesartan (AVAPRO) 75 MG tablet Take 1 tablet (75 mg total) by mouth daily. 30 tablet 3  . levETIRAcetam (KEPPRA) 500 MG tablet Take 1 tablet (500 mg total) by mouth 2 (two) times daily. 180 tablet 3  . metoprolol succinate (TOPROL-XL) 25 MG 24 hr tablet Take 1 tablet (25 mg total) by mouth daily. 30 tablet 3  . naproxen (NAPROSYN) 500 MG tablet Take 1 tablet (500 mg total) by mouth 2 (two) times daily with a meal. Use only as needed. 180 tablet 1  . OnabotulinumtoxinA (BOTOX IJ) Inject 500 Units as directed.     Marland Kitchen oxybutynin (DITROPAN-XL) 10 MG 24 hr tablet Take 10 mg by mouth 2 (two) times daily.    . pregabalin (LYRICA) 75 MG capsule Take 1 capsule (75 mg total) by mouth 3 (three) times daily. 270 capsule 1  . tiZANidine (ZANAFLEX) 2 MG tablet Take 2 tablets (4 mg total) by mouth 3 (three) times daily. 540 tablet 3   No current facility-administered medications on file prior to visit.     PAST MEDICAL HISTORY: Past Medical History:  Diagnosis Date  . Chronic pain    "in this left arm and leg that don't move"  . CVA (cerebrovascular accident) ([place]) 1995   "left side paralyzed"  . GERD (gastroesophageal reflux disease)   . Hypercholesteremia   . Hypertension   . Insomnia   . Neuropathy   . Overactive bladder   . Peripheral vascular disease ([place])    left arm and leg; since stroke 1995  . Seizures ([place]) 01/27/12   "first one ever"    PAST SURGICAL HISTORY: Past Surgical History:  Procedure Laterality Date  . CEREBRAL  ANEURYSM REPAIR  11/1993   "left my left side paralyzed"  . TUBAL LIGATION  04/1984    FAMILY HISTORY: Family History  Problem Relation Age of Onset  . Dementia Mother   . Diabetes type II Mother   . Heart attack Father   . Cancer Mother   . Diabetes Mother     SOCIAL HISTORY:  Social History   Social History  . Marital status: Divorced    Spouse name: N/A  . Number of children: 2  . Years of education: N/A   Occupational History  . Not on file.   Social History Main Topics  . Smoking status: Never Smoker  . Smokeless tobacco: Never Used  . Alcohol use No  . Drug use: No  . Sexual activity: Yes   Other Topics Concern  . Not on file   Social History Narrative  . No narrative on file     PHYSICAL EXAM   Vitals:   12/26/16 1331  BP: 135/81  Pulse: (!) 104  Weight: 194 lb (88  kg)  Height: 5\' 1"  (1.549 m)    Not recorded      Body mass index is 36.66 kg/m.   Generalized: In no acute distress  Neck: Supple, no carotid bruits   Cardiac: Regular rate rhythm  Pulmonary: Clear to auscultation bilaterally  Musculoskeletal: No deformity  Neurological examination  Mentation: Alert oriented to time, place, history taking, and causual conversation  Cranial nerve II-XII: Pupils were equal round reactive to light. Extraocular movements were full.  Left visual field cut.  Left lower face weakness. Hearing was intact to finger rubbing bilaterally. Uvula tongue midline.  Head turning were normal and symmetric.Tongue protrusion into cheek strength was normal.  Motor: Dense spastic left hemiparesis, left hand thumb in position, proximal and distal strength is 1 out of 5, left lower extremity 1 out of 5, left knee extension, ankle plantarflexion, when she ambulate, she complains of painful left toe flexion, left ankle plantarflexion  Sensory: Intact to fine touch, pinprick, preserved vibratory sensation, and proprioception at toes.  Coordination: Normal finger  to nose, heel-to-shin bilaterally there was no truncal ataxia  Gait: Spastic left hemi-circumferential gait, unsteady  Deep tendon reflexes: Left hyperreflexia   DIAGNOSTIC DATA (LABS, IMAGING, TESTING) - I reviewed patient records, labs, notes, testing and imaging myself where available.  Lab Results  Component Value Date   WBC 19.0 (H) 03/03/2013   HGB 10.5 (L) 03/03/2013   HCT 32.2 (L) 03/03/2013   MCV 78.0 03/03/2013   PLT 377 03/03/2013      Component Value Date/Time   NA 138 03/03/2013 1050   K 3.7 03/03/2013 1050   CL 103 03/03/2013 1050   CO2 23 03/03/2013 1050   GLUCOSE 111 (H) 03/03/2013 1050   BUN 12 03/03/2013 1050   CREATININE 1.10 03/03/2013 1050   CALCIUM 9.3 03/03/2013 1050   PROT 6.3 02/22/2013 0430   ALBUMIN 2.6 (L) 02/22/2013 0430   AST 21 02/22/2013 0430   ALT 23 02/22/2013 0430   ALKPHOS 93 02/22/2013 0430   BILITOT 0.1 (L) 02/22/2013 0430   GFRNONAA 56 (L) 03/03/2013 1050   GFRAA 65 (L) 03/03/2013 1050   Lab Results  Component Value Date   CHOL 133 01/28/2012   HDL 38 (L) 01/28/2012   LDLCALC 73 01/28/2012   TRIG 109 01/28/2012   CHOLHDL 3.5 01/28/2012   Lab Results  Component Value Date   HGBA1C 6.2 (H) 01/28/2012   ASSESSMENT AND PLAN  Jordan Rivas is a 56 y.o. female complains of  right hemisphere hemorrhagic stroke, at age 39, in 2003  complex partial seizure,   doing well with Keppra 500 mg twice a day,   Spastic left hemiparesis EMG guided guided Botox injection, I used 500 units of Botox A   Left upper extremity 400 units, left lower extremity 100 units  Left lumbricals injection, divided into the 2nd, 3rd, total of 50   Left palmaris longus 25 Left flexor digitorum profundi 25x2= 50 units  units  Left flexor digitorum superficialis 25  Left flexor carpi ulnaris 25  unit Left pronator teres 25  units  Left pronator quadratus 25 units    Left pectoralis major 75 units Left teres major 25 x2=50 units Left latissimus  dorsi 25x2=50 units   Left tibialis posterior 25  Left flexor digitorum longus 25 Left rectus femoris 25 units Left vastus medialis 25 units  She tolerated the injection well, will return to clinic in 3 months for repeat injection, need 500 units of BOTOX A  Marcial Pacas, M.D. Ph.D.  [facility] [address] [place], [place] [address] [phone]

## 2016-12-26 NOTE — Telephone Encounter (Signed)
Pt called to r/s appt today.  Please call

## 2016-12-28 ENCOUNTER — Other Ambulatory Visit: Payer: Self-pay | Admitting: Neurology

## 2017-01-10 ENCOUNTER — Telehealth: Payer: Self-pay | Admitting: Neurology

## 2017-01-10 NOTE — Telephone Encounter (Signed)
Pt calling for a refill of  naproxen (NAPROSYN) 500 MG tablet  [id number] [place], [place] - [address] [phone] (Phone) [phone] (Fax)

## 2017-01-10 NOTE — Telephone Encounter (Signed)
Spoke to patient - she is aware the prescription was sent to [facility] and she will contact them for the refill.

## 2017-01-21 NOTE — Telephone Encounter (Signed)
Error

## 2017-02-26 ENCOUNTER — Other Ambulatory Visit: Payer: Self-pay | Admitting: Neurology

## 2017-03-04 ENCOUNTER — Other Ambulatory Visit: Payer: Self-pay | Admitting: Neurology

## 2017-03-08 ENCOUNTER — Telehealth: Payer: Self-pay | Admitting: Neurology

## 2017-03-08 MED ORDER — PREGABALIN 75 MG PO CAPS: 75.0000 mg | ORAL_CAPSULE | Freq: Three times a day (TID) | ORAL | 1 refills | Status: DC

## 2017-03-08 NOTE — Telephone Encounter (Signed)
Patient called office in reference to pregabalin (LYRICA) 75 MG capsule.  Patient states [facility] is not using [facility] due to them not sending her the right amount of medication patient is out of medication.  Jordan Rivas

## 2017-03-08 NOTE — Telephone Encounter (Signed)
Rx. awaiting RAS sig/fim 

## 2017-03-08 NOTE — Telephone Encounter (Signed)
Lyrica rx. faxed to [facility] per pt's request/fim

## 2017-03-08 NOTE — Addendum Note (Signed)
Addended by: France Ravens I on: 03/08/2017 09:12 AM   Modules accepted: Orders

## 2017-04-04 ENCOUNTER — Ambulatory Visit: Payer: Medicare Other | Admitting: Neurology

## 2017-05-08 ENCOUNTER — Telehealth: Payer: Self-pay | Admitting: *Deleted

## 2017-05-08 ENCOUNTER — Ambulatory Visit: Payer: Medicare Other | Admitting: Neurology

## 2017-05-08 NOTE — Telephone Encounter (Signed)
Canceled appt same day - transportation issues.

## 2017-06-10 ENCOUNTER — Telehealth: Payer: Self-pay | Admitting: Neurology

## 2017-06-10 NOTE — Telephone Encounter (Signed)
Patient called office in reference to wanting to reschedule her Botox appointment she had back on 05/08/17.  Please call

## 2017-06-11 NOTE — Telephone Encounter (Signed)
I called and rescheduled the patients injection.

## 2017-06-21 ENCOUNTER — Other Ambulatory Visit: Payer: Self-pay | Admitting: Neurology

## 2017-06-24 ENCOUNTER — Telehealth: Payer: Self-pay | Admitting: Neurology

## 2017-06-24 NOTE — Telephone Encounter (Signed)
Jordan/[place] [phone] botox to be delivered 07/11/17

## 2017-06-25 NOTE — Telephone Encounter (Signed)
Noted, thank you

## 2017-07-18 ENCOUNTER — Encounter: Payer: Self-pay | Admitting: Neurology

## 2017-07-18 ENCOUNTER — Telehealth: Payer: Self-pay | Admitting: Neurology

## 2017-07-18 ENCOUNTER — Encounter (INDEPENDENT_AMBULATORY_CARE_PROVIDER_SITE_OTHER): Payer: Self-pay

## 2017-07-18 ENCOUNTER — Ambulatory Visit (INDEPENDENT_AMBULATORY_CARE_PROVIDER_SITE_OTHER): Payer: Medicare Other | Admitting: Neurology

## 2017-07-18 VITALS — BP 124/77 | HR 112 | Ht 61.0 in | Wt 187.0 lb

## 2017-07-18 DIAGNOSIS — G8114 Spastic hemiplegia affecting left nondominant side: Secondary | ICD-10-CM

## 2017-07-18 DIAGNOSIS — I69354 Hemiplegia and hemiparesis following cerebral infarction affecting left non-dominant side: Secondary | ICD-10-CM

## 2017-07-18 NOTE — Telephone Encounter (Signed)
I called to schedule patient for her next injection, she did not answer so I left a VM asking her to call me back.  °

## 2017-07-18 NOTE — Progress Notes (Signed)
**  Botox 100 units x 5 vials, NDC 9381-0175-10, Lot [id number], Exp 12/2019, specialty pharmacy.//mck,rn**

## 2017-07-18 NOTE — Progress Notes (Signed)
PATIENT: Jordan Rivas DOB: 09-18-1960  HISTORICAL  CASSANDRE OLEKSY is on 56 years old African American female, accompanied by her daughter for evaluation of seizure, left spastic hemiparesis  She had a history of right hemisphere hemorrhagic stroke due to brain aneurysm per patient at age 17, was residual severe left spastic hemiparesis, left visual field cut, profound gait difficulty,  She presented with her only seizure in Jan 26 2013, she woke up in the middle of the night using at home, while walking towards the bathroom, she fell, with witnessed seizure-like activity, tongue biting, woke up confused, at the hospital  MRI of the brain demonstrate extensive encephalomalacia involving right basal ganglion, perisylvian fissure region, no acute lesions, she has been treated with Keppra 500 mg twice a day, she has no recurrent seizure,  She continues to complain deep body aching pain at the left side, involving her left upper and lower extremity, specificity, difficulty straightening up her left finger, left leg spasm, she reported improvement of her left lower extremity spasm after receiving Botox injection in the past, but it was many years ago,  She also complains of couple years history of frequent headaches, getting worse almost daily basis, she has been taking frequent Excedrin migraines, left retro-orbital area severe pounding headache was associated light, noise sensitivity,  UPDATE July 1st 2015: Patient returned for EMG guided Botox injection for her spastic left upper, and the lower extremity, potential side effects explained, she wants the injection to emphasize on her left ankle plantarflexion, and left finger flexion  UPDATE Nov 26th 2015: She responded very well to her first EMG guided Botox injection in July first 2015, she received 400 units, she reported less spasticity of her left arm, left foot, ambulate better, less left leg pain, no significant side effect  noticed  UPDATE Feb 24th 2016: She responded to previous injection in November 2015, especially her left upper extremity, no significant side effect,  She has no recurrent seizure, taking Keppra 500 mg twice a day She has chronic insomnia, taking Ambien 5 mg 2 tablets every night  UPDATE February 24 2015: Last injection was in October 27 2014, she responded very well, she can loosen her left shoulder much better, no significant side effect  Update September 21 2015: She responded very well to previous EMG guided Botox injection, she can straight out her finger better, no significant side effect notice, no recurrent seizure taking Keppra 500 mg twice a day  Update December 22 2015: She responded very well to previous injection significant side effect noticed, she now complains left knee hyperextension left knee pain, finger flexion vomiting position, she has no recurrent seizure, now taking Keppra 500 mg twice a day,  Update April 24 2016: She had no recurrent seizure, taking Keppra 500 mg twice a day, previous Botox ingestion has helped relaxed her shoulder, wrist, she complains of left  knee pain, tends to hyperextend her knee  UPDATE Nov 29th 2017: She responded well to previous injection but continue have significant left shoulder tightness, left foot tightness, especially left toes crunch underneath while ambulating  Update December 26 2016: She responded very well to previous Botox injection but his 5 month out from previous injection in November 2017, she noticed significant tightness of left shoulder, left forearm in position, left ankle plantarflexion, she had no recurrent seizure, tizanidine as needed has been helpful  UPDATE Jul 18 2017: She is accompanied by her son at today's clinic visit last injection was  in April, no she has significant spasticity of left arm shoulder, painful with passive movement  REVIEW OF SYSTEMS: Full 14 system review of systems performed and notable only for as  above, ALLERGIES: Allergies  Allergen Reactions  . Celebrex [Celecoxib] Swelling    "lips; it was terrible"  . Baclofen Swelling    HOME MEDICATIONS: Current Outpatient Medications on File Prior to Visit  Medication Sig Dispense Refill  . amitriptyline (ELAVIL) 100 MG tablet   0  . aspirin 81 MG chewable tablet Chew 1 tablet (81 mg total) by mouth daily. 30 tablet 3  . atorvastatin (LIPITOR) 40 MG tablet     . BOTOX 100 units SOLR injection INJECT 500 UNITS INTRAMUSCULARLY EVERY 3 MONTHS (GIVEN AT MD OFFICE, DISCARD UNUSED PORTION AFTER FIRST USE) 5 vial 3  . Cholecalciferol (VITAMIN D) 1000 UNITS capsule Take 1,000 Units by mouth daily.    . DULoxetine (CYMBALTA) 60 MG capsule Take 60 mg by mouth daily.    Marland Kitchen esomeprazole (NEXIUM) 40 MG capsule Take 40 mg by mouth 2 (two) times daily.    . irbesartan (AVAPRO) 75 MG tablet Take 1 tablet (75 mg total) by mouth daily. 30 tablet 3  . levETIRAcetam (KEPPRA) 500 MG tablet Take 1 tablet (500 mg total) by mouth 2 (two) times daily. 180 tablet 3  . metoprolol succinate (TOPROL-XL) 25 MG 24 hr tablet Take 1 tablet (25 mg total) by mouth daily. 30 tablet 3  . naproxen (NAPROSYN) 500 MG tablet Take 1 tablet (500 mg total) by mouth 2 (two) times daily with a meal. Use only as needed. 180 tablet 1  . OnabotulinumtoxinA (BOTOX IJ) Inject 500 Units as directed.     Marland Kitchen oxybutynin (DITROPAN-XL) 10 MG 24 hr tablet Take 10 mg by mouth 2 (two) times daily.    Marland Kitchen oxyCODONE (ROXICODONE) 15 MG immediate release tablet Take 15 mg by mouth every 8 (eight) hours as needed for pain.    . pregabalin (LYRICA) 75 MG capsule Take 1 capsule (75 mg total) by mouth 3 (three) times daily. 270 capsule 1  . tiZANidine (ZANAFLEX) 2 MG tablet Take 2 tablets (4 mg total) by mouth 3 (three) times daily. 540 tablet 3   No current facility-administered medications on file prior to visit.     PAST MEDICAL HISTORY: Past Medical History:  Diagnosis Date  . Chronic pain    "in  this left arm and leg that don't move"  . CVA (cerebrovascular accident) ([place]) 1995   "left side paralyzed"  . GERD (gastroesophageal reflux disease)   . Hypercholesteremia   . Hypertension   . Insomnia   . Neuropathy   . Overactive bladder   . Peripheral vascular disease ([place])    left arm and leg; since stroke 1995  . Seizures ([place]) 01/27/12   "first one ever"    PAST SURGICAL HISTORY: Past Surgical History:  Procedure Laterality Date  . CEREBRAL ANEURYSM REPAIR  11/1993   "left my left side paralyzed"  . TUBAL LIGATION  04/1984    FAMILY HISTORY: Family History  Problem Relation Age of Onset  . Dementia Mother   . Diabetes type II Mother   . Heart attack Father   . Cancer Mother   . Diabetes Mother     SOCIAL HISTORY:  Social History   Socioeconomic History  . Marital status: Divorced    Spouse name: Not on file  . Number of children: 2  . Years of education: Not on file  .  Highest education level: Not on file  Social Needs  . Financial resource strain: Not on file  . Food insecurity - worry: Not on file  . Food insecurity - inability: Not on file  . Transportation needs - medical: Not on file  . Transportation needs - non-medical: Not on file  Occupational History  . Not on file  Tobacco Use  . Smoking status: Never Smoker  . Smokeless tobacco: Never Used  Substance and Sexual Activity  . Alcohol use: No  . Drug use: No  . Sexual activity: Yes  Other Topics Concern  . Not on file  Social History Narrative  . Not on file     PHYSICAL EXAM   Vitals:   07/18/17 1138  BP: 124/77  Pulse: (!) 112  Weight: 187 lb (84.8 kg)  Height: 5\' 1"  (1.549 m)    Not recorded      Body mass index is 35.33 kg/m.   Generalized: In no acute distress  Neck: Supple, no carotid bruits   Cardiac: Regular rate rhythm  Pulmonary: Clear to auscultation bilaterally  Musculoskeletal: No deformity  Neurological examination  Mentation: Alert oriented to  time, place, history taking, and causual conversation  Cranial nerve II-XII: Pupils were equal round reactive to light. Extraocular movements were full.  Left visual field cut.  Left lower face weakness. Hearing was intact to finger rubbing bilaterally. Uvula tongue midline.  Head turning were normal and symmetric.Tongue protrusion into cheek strength was normal.  Motor: Dense spastic left hemiparesis, left hand thumb in position, proximal and distal strength is 1 out of 5, left lower extremity 1 out of 5, left knee extension, ankle plantarflexion, when she ambulate, she complains of painful left toe flexion, left ankle plantarflexion  Sensory: Intact to fine touch, pinprick, preserved vibratory sensation, and proprioception at toes.  Coordination: Normal finger to nose, heel-to-shin bilaterally there was no truncal ataxia  Gait: Spastic left hemi-circumferential gait, unsteady  Deep tendon reflexes: Left hyperreflexia   DIAGNOSTIC DATA (LABS, IMAGING, TESTING) - I reviewed patient records, labs, notes, testing and imaging myself where available.  Lab Results  Component Value Date   WBC 19.0 (H) 03/03/2013   HGB 10.5 (L) 03/03/2013   HCT 32.2 (L) 03/03/2013   MCV 78.0 03/03/2013   PLT 377 03/03/2013      Component Value Date/Time   NA 138 03/03/2013 1050   K 3.7 03/03/2013 1050   CL 103 03/03/2013 1050   CO2 23 03/03/2013 1050   GLUCOSE 111 (H) 03/03/2013 1050   BUN 12 03/03/2013 1050   CREATININE 1.10 03/03/2013 1050   CALCIUM 9.3 03/03/2013 1050   PROT 6.3 02/22/2013 0430   ALBUMIN 2.6 (L) 02/22/2013 0430   AST 21 02/22/2013 0430   ALT 23 02/22/2013 0430   ALKPHOS 93 02/22/2013 0430   BILITOT 0.1 (L) 02/22/2013 0430   GFRNONAA 56 (L) 03/03/2013 1050   GFRAA 65 (L) 03/03/2013 1050   Lab Results  Component Value Date   CHOL 133 01/28/2012   HDL 38 (L) 01/28/2012   LDLCALC 73 01/28/2012   TRIG 109 01/28/2012   CHOLHDL 3.5 01/28/2012   Lab Results  Component Value  Date   HGBA1C 6.2 (H) 01/28/2012   ASSESSMENT AND PLAN  ZORANA BROCKWELL is a 56 y.o. female complains of  right hemisphere hemorrhagic stroke, at age 75, in 2003  complex partial seizure,   doing well with Keppra 500 mg twice a day,   Spastic left hemiparesis EMG  guided guided Botox injection, used 500 units of Botox A    Left palmaris longus 50 Left flexor digitorum profundi 50 units Left opponens 25 units  Left flexor carpi ulnaris 25  unit Left pronator teres 50  units   Left pectoralis major 100 units Left teres major 25 x2=50 units Left latissimus dorsi 25x2=50 units  Left biceps 50 units Left brachialis 50 units   She tolerated the injection well, will return to clinic in 3 months for repeat injection, need 500 units of BOTOX A     Marcial Pacas, M.D. Ph.D.  [facility] [address] [place], [place] [address] [phone]

## 2017-09-04 ENCOUNTER — Other Ambulatory Visit: Payer: Self-pay | Admitting: Neurology

## 2017-09-06 ENCOUNTER — Ambulatory Visit
Admission: RE | Admit: 2017-09-06 | Discharge: 2017-09-06 | Disposition: A | Discharge status: Home / Self Care | Payer: Medicare Other | Source: Ambulatory Visit | Attending: Family Medicine | Admitting: Family Medicine

## 2017-09-06 ENCOUNTER — Other Ambulatory Visit: Payer: Self-pay | Admitting: Family Medicine

## 2017-09-06 DIAGNOSIS — M79602 Pain in left arm: Secondary | ICD-10-CM

## 2017-09-11 NOTE — H&P (Signed)
HISTORY AND PHYSICAL  Jordan Rivas is a 57 y.o. female patient with CC: painful teeth.  No diagnosis found.  Past Medical History:  Diagnosis Date  . Chronic pain    "in this left arm and leg that don't move"  . CVA (cerebrovascular accident) ([employer]) 1995   "left side paralyzed"  . GERD (gastroesophageal reflux disease)   . Hypercholesteremia   . Hypertension   . Insomnia   . Neuropathy   . Overactive bladder   . Peripheral vascular disease ([place])    left arm and leg; since stroke 1995  . Seizures ([place]) 01/27/12   "first one ever"    No current facility-administered medications for this encounter.    Current Outpatient Medications  Medication Sig Dispense Refill  . amitriptyline (ELAVIL) 100 MG tablet Take 100 mg by mouth at bedtime.   0  . aspirin 81 MG chewable tablet Chew 1 tablet (81 mg total) by mouth daily. 30 tablet 3  . atorvastatin (LIPITOR) 40 MG tablet Take 40 mg by mouth daily.     Marland Kitchen BOTOX 100 units SOLR injection INJECT 500 UNITS INTRAMUSCULARLY EVERY 3 MONTHS (GIVEN AT MD OFFICE, DISCARD UNUSED PORTION AFTER FIRST USE) 5 vial 3  . Cholecalciferol (VITAMIN D) 1000 UNITS capsule Take 1,000 Units by mouth daily.    . DULoxetine (CYMBALTA) 60 MG capsule Take 60 mg by mouth daily.    Marland Kitchen esomeprazole (NEXIUM) 40 MG capsule Take 40 mg by mouth 2 (two) times daily.    Marland Kitchen ketoconazole (NIZORAL) 2 % cream Apply 1 application topically 2 (two) times daily as needed for irritation.    . levETIRAcetam (KEPPRA) 500 MG tablet Take 1 tablet (500 mg total) by mouth 2 (two) times daily. 180 tablet 3  . lidocaine (XYLOCAINE) 5 % ointment Apply 1 application topically 4 (four) times daily as needed for moderate pain.     Marland Kitchen LYRICA 75 MG capsule take 1 capsule by mouth three times a day (Patient taking differently: Take 75 mg by mouth 3 times daily) 270 capsule 0  . meloxicam (MOBIC) 15 MG tablet Take 15 mg by mouth daily.    . metFORMIN (GLUCOPHAGE) 500 MG tablet Take 500 mg by mouth  daily.  1  . metoprolol succinate (TOPROL-XL) 25 MG 24 hr tablet Take 1 tablet (25 mg total) by mouth daily. 30 tablet 3  . naproxen (NAPROSYN) 500 MG tablet Take 1 tablet (500 mg total) by mouth 2 (two) times daily with a meal. Use only as needed. (Patient taking differently: Take 500 mg by mouth 2 (two) times daily as needed for moderate pain. ) 180 tablet 1  . oxybutynin (DITROPAN-XL) 10 MG 24 hr tablet Take 10 mg by mouth 2 (two) times daily.    Marland Kitchen oxyCODONE (ROXICODONE) 15 MG immediate release tablet Take 15 mg by mouth 4 (four) times daily as needed for pain.     Marland Kitchen tiZANidine (ZANAFLEX) 2 MG tablet Take 2 tablets (4 mg total) by mouth 3 (three) times daily. (Patient taking differently: Take 2 mg by mouth 3 (three) times daily. ) 540 tablet 3  . irbesartan (AVAPRO) 75 MG tablet Take 1 tablet (75 mg total) by mouth daily. (Patient not taking: Reported on 09/06/2017) 30 tablet 3   Allergies  Allergen Reactions  . Celebrex [Celecoxib] Swelling and Other (See Comments)    "lips; it was terrible"  . Baclofen Swelling   Active Problems:   * No active hospital problems. *  Vitals: Last  menstrual period 09/30/2011. Lab results:No results found for this or any previous visit (from the past 43 hour(s)). Radiology Results: No results found. General appearance: alert, cooperative and morbidly obese Head: Normocephalic, without obvious abnormality, atraumatic Eyes: negative Nose: Nares normal. Septum midline. Mucosa normal. No drainage or sinus tenderness. Throat: dental caries teeth # 5, 6, 7, 9, 10, 11, 12, 26, 27, 28. Irregular maxillary and mandibular alveolar contour. Pharynx clear.  Neck: no adenopathy, supple, symmetrical, trachea midline and thyroid not enlarged, symmetric, no tenderness/mass/nodules Resp: clear to auscultation bilaterally Cardio: regular rate and rhythm, S1, S2 normal, no murmur, click, rub or gallop  Assessment: Multiple nonrestorable teeth  Plan:full mouth extractions  with alveoloplasty. GA. Nasal. Day surgery.   Diona Browner 09/11/2017

## 2017-09-12 ENCOUNTER — Encounter (HOSPITAL_COMMUNITY): Payer: Self-pay | Admitting: *Deleted

## 2017-09-12 ENCOUNTER — Other Ambulatory Visit: Payer: Self-pay

## 2017-09-12 NOTE — Progress Notes (Signed)
Pt denies SOB, chest pain, and being under the care of a cardiologist. Pt denies having a stress test and cardiac cath.  Pt denies having an EKG and chest x ray within the last year. Pt denies recent labs. Pt made aware to stop taking vitamins, fish oil and herbal medications. Do not take any NSAIDs ie: Ibuprofen, Advil, Naproxen ( Naprosyn/Aleve ), Mobic, Motrin, BC and Goody Powder. Pt made aware to not take Metformin DOS. Pt made aware to check BG every 2 hours prior to arrival to hospital on DOS. Pt made aware to treat a BG < 70 with 4 ounces of cranberry juice, wait 15 minutes after intervention to recheck BG, if BG remains < 70, call Short Stay unit to speak with a nurse. Pt verbalized understanding of all pre-op instructions. Anesthesia asked to review pt echo (45%).

## 2017-09-12 NOTE — Progress Notes (Signed)
Anesthesia chart review: Patient is a 57 year old female scheduled for dental restoration/extraction with x-ray on 09/13/17 by Dr. Diona Browner.  History includes never smoker, HTN, DM2, hypercholesterolemia, overactive bladder, neuropathy, insomnia, GERD, PVD, seizures, hemorrhagic CVA 1995 with "left side paralyzed", cerebral aneurysm repair March 1995 depression, headaches. She had NSTEMI 02/2013 in the setting of urosepsis with EF dropping from 55-60% to 40-45% with diffuse hypokinesis within 48 hours. Myoview was low risk. Repeat echo in 3 months recommended 03/2013, but this did not happen.    - PCP is listed as Dr. Mayra Neer. - Neurologist is Dr. Marcial Pacas. - Last cardiology visit seen is with Kerin Ransom, PA-C/Dr. Shelva Majestic on 03/13/13. Patient denied on-going follow-up with a cardiologist.  Meds include amitriptyline, aspirin 81 mg, Lipitor, Botox injections, Cymbalta, Nexium, Keppra, Lyrica, metformin, Toprol-XL, Ditropan XL, oxycodone, Zanaflex.  Last EKG seen is from 03/13/13 and showed NSR, minimal voltage criteria for LVH, cannot rule out anterior infarct.   Nuclear stress test 03/02/13: IMPRESSION: 1.  Mild attenuation involving the anterior wall extending towards the left ventricular apex.  No definitive scintigraphic evidence of prior infarction or pharmacologically induced ischemia. 2.  Global hypokinesia.  Ejection fraction - 43%.  Echo 02/24/13: Study Conclusions - Left ventricle: The cavity size was normal. Systolic function was mildly to moderately reduced. The estimated ejection fraction was in the range of 40% to 45%. Diffuse hypokinesis. Features are consistent with a pseudonormal left ventricular filling pattern, with concomitant abnormal relaxation and increased filling pressure (grade 2 diastolic dysfunction). - Aortic valve: Trivial regurgitation. - Mitral valve: Mild regurgitation. - Pulmonary arteries: PA peak pressure: 36mm Hg (S). (02/22/13  echo showed: EF 55-60%, normal wall motion and no regional wall motion abnormalities.)  No recent labs currently available.  Per PAT RN phone interview: "Pt denies SOB, chest pain, and being under the care of a cardiologist. Pt denies having a stress test and cardiac cath.  Pt denies having an EKG and chest x ray within the last year."  Patient drop in EF and hypokinesis 02/2013 in the setting of sepsis. Low risk stress test at that time, but she never had a follow-up echo to reassess EF. She denied any CV symptoms. Reviewed above with anesthesiologist Dr. Roberts Gaudy. Patient to be evaluated on the day of surgery.   George Hugh [facility] Short Stay Center/Anesthesiology Phone [phone] 09/12/2017 5:12 PM

## 2017-09-13 ENCOUNTER — Encounter (HOSPITAL_COMMUNITY)
Admission: RE | Disposition: A | Discharge status: Home / Self Care | Payer: Self-pay | Source: Ambulatory Visit | Attending: Oral Surgery

## 2017-09-13 ENCOUNTER — Ambulatory Visit (HOSPITAL_COMMUNITY): Payer: Medicare Other | Admitting: Vascular Surgery

## 2017-09-13 ENCOUNTER — Encounter (HOSPITAL_COMMUNITY): Payer: Self-pay

## 2017-09-13 ENCOUNTER — Ambulatory Visit (HOSPITAL_COMMUNITY)
Admission: RE | Admit: 2017-09-13 | Discharge: 2017-09-13 | Disposition: A | Discharge status: Home / Self Care | Payer: Medicare Other | Source: Ambulatory Visit | Attending: Oral Surgery | Admitting: Oral Surgery

## 2017-09-13 DIAGNOSIS — K029 Dental caries, unspecified: Secondary | ICD-10-CM | POA: Diagnosis present

## 2017-09-13 DIAGNOSIS — K0530 Chronic periodontitis, unspecified: Secondary | ICD-10-CM | POA: Diagnosis not present

## 2017-09-13 DIAGNOSIS — Z79899 Other long term (current) drug therapy: Secondary | ICD-10-CM | POA: Insufficient documentation

## 2017-09-13 DIAGNOSIS — E7800 Pure hypercholesterolemia, unspecified: Secondary | ICD-10-CM | POA: Insufficient documentation

## 2017-09-13 DIAGNOSIS — I739 Peripheral vascular disease, unspecified: Secondary | ICD-10-CM | POA: Diagnosis not present

## 2017-09-13 DIAGNOSIS — G629 Polyneuropathy, unspecified: Secondary | ICD-10-CM | POA: Diagnosis not present

## 2017-09-13 DIAGNOSIS — I10 Essential (primary) hypertension: Secondary | ICD-10-CM | POA: Insufficient documentation

## 2017-09-13 DIAGNOSIS — Z7982 Long term (current) use of aspirin: Secondary | ICD-10-CM | POA: Diagnosis not present

## 2017-09-13 DIAGNOSIS — Z7984 Long term (current) use of oral hypoglycemic drugs: Secondary | ICD-10-CM | POA: Diagnosis not present

## 2017-09-13 DIAGNOSIS — K219 Gastro-esophageal reflux disease without esophagitis: Secondary | ICD-10-CM | POA: Insufficient documentation

## 2017-09-13 DIAGNOSIS — G4700 Insomnia, unspecified: Secondary | ICD-10-CM | POA: Insufficient documentation

## 2017-09-13 DIAGNOSIS — Z8673 Personal history of transient ischemic attack (TIA), and cerebral infarction without residual deficits: Secondary | ICD-10-CM | POA: Diagnosis not present

## 2017-09-13 HISTORY — DX: Depression, unspecified: F32.A

## 2017-09-13 HISTORY — DX: Headache, unspecified: R51.9

## 2017-09-13 HISTORY — DX: Pneumonia, unspecified organism: J18.9

## 2017-09-13 HISTORY — DX: Type 2 diabetes mellitus without complications: E11.9

## 2017-09-13 HISTORY — DX: Headache: R51

## 2017-09-13 HISTORY — PX: DENTAL RESTORATION/EXTRACTION WITH X-RAY: SHX5796

## 2017-09-13 HISTORY — DX: Dental caries, unspecified: K02.9

## 2017-09-13 HISTORY — DX: Major depressive disorder, single episode, unspecified: F32.9

## 2017-09-13 LAB — BASIC METABOLIC PANEL
Anion gap: 9 (ref 5–15)
BUN: 9 mg/dL (ref 6–20)
CHLORIDE: 100 mmol/L — AB (ref 101–111)
CO2: 28 mmol/L (ref 22–32)
Calcium: 8.9 mg/dL (ref 8.9–10.3)
Creatinine, Ser: 0.76 mg/dL (ref 0.44–1.00)
GFR calc Af Amer: >60 mL/min (ref >60)
GFR calc non Af Amer: >60 mL/min (ref >60)
Glucose, Bld: 112 mg/dL — ABNORMAL HIGH (ref 65–99)
POTASSIUM: 3.9 mmol/L (ref 3.5–5.1)
SODIUM: 137 mmol/L (ref 135–145)

## 2017-09-13 LAB — CBC
HEMATOCRIT: 35.5 % — AB (ref 36.0–46.0)
Hemoglobin: 11 g/dL — ABNORMAL LOW (ref 12.0–15.0)
MCH: 24.6 pg — ABNORMAL LOW (ref 26.0–34.0)
MCHC: 31 g/dL (ref 30.0–36.0)
MCV: 79.2 fL (ref 78.0–100.0)
Platelets: 353 10*3/uL (ref 150–400)
RBC: 4.48 MIL/uL (ref 3.87–5.11)
RDW: 17.1 % — AB (ref 11.5–15.5)
WBC: 12.5 10*3/uL — AB (ref 4.0–10.5)

## 2017-09-13 LAB — GLUCOSE, CAPILLARY
Glucose-Capillary: 105 mg/dL — ABNORMAL HIGH (ref 65–99)
Glucose-Capillary: 160 mg/dL — ABNORMAL HIGH (ref 65–99)

## 2017-09-13 LAB — HEMOGLOBIN A1C
Hgb A1c MFr Bld: 6.5 % — ABNORMAL HIGH (ref 4.8–5.6)
Mean Plasma Glucose: 139.85 mg/dL

## 2017-09-13 SURGERY — DENTAL RESTORATION/EXTRACTION WITH X-RAY
Anesthesia: General | Site: Mouth

## 2017-09-13 MED ORDER — SODIUM CHLORIDE 0.9 % IR SOLN
Status: DC | PRN
  Administered 2017-09-13 (×2): 1000 mL

## 2017-09-13 MED ORDER — ONDANSETRON HCL 4 MG/2ML IJ SOLN: 4.0000 mg | Freq: Once | INTRAMUSCULAR | Status: DC | PRN

## 2017-09-13 MED ORDER — MIDAZOLAM HCL 5 MG/5ML IJ SOLN
INTRAMUSCULAR | Status: DC | PRN
  Administered 2017-09-13: 2 mg via INTRAVENOUS

## 2017-09-13 MED ORDER — ROCURONIUM BROMIDE 100 MG/10ML IV SOLN
INTRAVENOUS | Status: DC | PRN
  Administered 2017-09-13: 50 mg via INTRAVENOUS

## 2017-09-13 MED ORDER — 0.9 % SODIUM CHLORIDE (POUR BTL) OPTIME
TOPICAL | Status: DC | PRN
  Administered 2017-09-13 (×2): 1000 mL

## 2017-09-13 MED ORDER — PROPOFOL 10 MG/ML IV BOLUS
INTRAVENOUS | Status: DC | PRN
  Administered 2017-09-13: 200 mg via INTRAVENOUS

## 2017-09-13 MED ORDER — LIDOCAINE-EPINEPHRINE 2 %-1:100000 IJ SOLN
INTRAMUSCULAR | Status: DC | PRN
  Administered 2017-09-13: 10 mL via INTRADERMAL

## 2017-09-13 MED ORDER — LACTATED RINGERS IV SOLN
INTRAVENOUS | Status: DC
  Administered 2017-09-13 (×2): via INTRAVENOUS

## 2017-09-13 MED ORDER — OXYMETAZOLINE HCL 0.05 % NA SOLN
NASAL | Status: DC | PRN
  Administered 2017-09-13: 1

## 2017-09-13 MED ORDER — IBUPROFEN 800 MG PO TABS: 800.0000 mg | ORAL_TABLET | Freq: Three times a day (TID) | ORAL | 0 refills | Status: DC | PRN

## 2017-09-13 MED ORDER — ONDANSETRON HCL 4 MG/2ML IJ SOLN
INTRAMUSCULAR | Status: DC | PRN
  Administered 2017-09-13: 4 mg via INTRAVENOUS

## 2017-09-13 MED ORDER — SUGAMMADEX SODIUM 200 MG/2ML IV SOLN
INTRAVENOUS | Status: DC | PRN
  Administered 2017-09-13: 339.2 mg via INTRAVENOUS

## 2017-09-13 MED ORDER — HYDROMORPHONE HCL 1 MG/ML IJ SOLN: 0.2500 mg | INTRAMUSCULAR | Status: DC | PRN

## 2017-09-13 MED ORDER — MIDAZOLAM HCL 2 MG/2ML IJ SOLN
INTRAMUSCULAR | Status: AC
  Filled 2017-09-13: qty 2

## 2017-09-13 MED ORDER — ESMOLOL HCL 100 MG/10ML IV SOLN
INTRAVENOUS | Status: DC | PRN
  Administered 2017-09-13 (×2): 20 mg via INTRAVENOUS

## 2017-09-13 MED ORDER — FENTANYL CITRATE (PF) 100 MCG/2ML IJ SOLN
INTRAMUSCULAR | Status: DC | PRN
  Administered 2017-09-13: 150 ug via INTRAVENOUS
  Administered 2017-09-13: 50 ug via INTRAVENOUS

## 2017-09-13 MED ORDER — LIDOCAINE HCL (CARDIAC) 20 MG/ML IV SOLN
INTRAVENOUS | Status: DC | PRN
  Administered 2017-09-13: 100 mg via INTRAVENOUS

## 2017-09-13 MED ORDER — FENTANYL CITRATE (PF) 250 MCG/5ML IJ SOLN
INTRAMUSCULAR | Status: AC
  Filled 2017-09-13: qty 5

## 2017-09-13 MED ORDER — CEFAZOLIN SODIUM-DEXTROSE 2-4 GM/100ML-% IV SOLN
2.0000 g | INTRAVENOUS | Status: AC
  Administered 2017-09-13: 2 g via INTRAVENOUS
  Filled 2017-09-13: qty 100

## 2017-09-13 MED ORDER — LIDOCAINE-EPINEPHRINE 2 %-1:100000 IJ SOLN
INTRAMUSCULAR | Status: AC
  Filled 2017-09-13: qty 1

## 2017-09-13 MED ORDER — MEPERIDINE HCL 25 MG/ML IJ SOLN: 6.2500 mg | INTRAMUSCULAR | Status: DC | PRN

## 2017-09-13 MED ORDER — OXYMETAZOLINE HCL 0.05 % NA SOLN
NASAL | Status: AC
  Filled 2017-09-13: qty 15

## 2017-09-13 SURGICAL SUPPLY — 34 items
BALL CTTN LRG ABS STRL LF (GAUZE/BANDAGES/DRESSINGS) ×1
BLADE SURG 15 STRL LF DISP TIS (BLADE) IMPLANT
BLADE SURG 15 STRL SS (BLADE) ×3
BUR CROSS CUT FISSURE 1.6 (BURR) ×2 IMPLANT
BUR CROSS CUT FISSURE 1.6MM (BURR) ×1
BUR EGG ELITE 4.0 (BURR) ×1 IMPLANT
BUR EGG ELITE 4.0MM (BURR) ×1
CANISTER SUCT 3000ML PPV (MISCELLANEOUS) ×3 IMPLANT
COTTONBALL LRG STERILE PKG (GAUZE/BANDAGES/DRESSINGS) ×2 IMPLANT
COVER SURGICAL LIGHT HANDLE (MISCELLANEOUS) ×3 IMPLANT
GAUZE PACKING FOLDED 2  STR (GAUZE/BANDAGES/DRESSINGS) ×2
GAUZE PACKING FOLDED 2 STR (GAUZE/BANDAGES/DRESSINGS) ×1 IMPLANT
GLOVE BIO SURGEON STRL SZ 6.5 (GLOVE) ×1 IMPLANT
GLOVE BIO SURGEON STRL SZ7 (GLOVE) IMPLANT
GLOVE BIO SURGEON STRL SZ7.5 (GLOVE) ×3 IMPLANT
GLOVE BIO SURGEONS STRL SZ 6.5 (GLOVE) ×1
GLOVE BIOGEL PI IND STRL 6.5 (GLOVE) IMPLANT
GLOVE BIOGEL PI IND STRL 7.0 (GLOVE) IMPLANT
GLOVE BIOGEL PI INDICATOR 6.5 (GLOVE) ×2
GLOVE BIOGEL PI INDICATOR 7.0 (GLOVE)
GOWN STRL REUS W/ TWL LRG LVL3 (GOWN DISPOSABLE) ×1 IMPLANT
GOWN STRL REUS W/ TWL XL LVL3 (GOWN DISPOSABLE) ×1 IMPLANT
GOWN STRL REUS W/TWL LRG LVL3 (GOWN DISPOSABLE) ×3
GOWN STRL REUS W/TWL XL LVL3 (GOWN DISPOSABLE) ×3
KIT BASIN OR (CUSTOM PROCEDURE TRAY) ×3 IMPLANT
KIT ROOM TURNOVER OR (KITS) ×3 IMPLANT
NEEDLE 22X1 1/2 (OR ONLY) (NEEDLE) ×6 IMPLANT
NS IRRIG 1000ML POUR BTL (IV SOLUTION) ×3 IMPLANT
PAD ARMBOARD 7.5X6 YLW CONV (MISCELLANEOUS) ×3 IMPLANT
SPONGE SURGIFOAM ABS GEL 12-7 (HEMOSTASIS) IMPLANT
SUT CHROMIC 3 0 PS 2 (SUTURE) ×5 IMPLANT
TRAY ENT MC OR (CUSTOM PROCEDURE TRAY) ×3 IMPLANT
TUBING IRRIGATION (MISCELLANEOUS) ×3 IMPLANT
YANKAUER SUCT BULB TIP NO VENT (SUCTIONS) ×3 IMPLANT

## 2017-09-13 NOTE — Anesthesia Preprocedure Evaluation (Signed)
Anesthesia Evaluation  Patient identified by MRN, date of birth, ID band Patient awake    Reviewed: Allergy & Precautions, NPO status , Patient's Chart, lab work & pertinent test results  Airway Mallampati: I  TM Distance: >3 FB Neck ROM: Full    Dental   Pulmonary    Pulmonary exam normal        Cardiovascular hypertension, Pt. on medications + Past MI  Normal cardiovascular exam     Neuro/Psych Depression CVA    GI/Hepatic GERD  Medicated and Controlled,  Endo/Other  diabetes  Renal/GU      Musculoskeletal   Abdominal   Peds  Hematology   Anesthesia Other Findings   Reproductive/Obstetrics                             Anesthesia Physical Anesthesia Plan  ASA: III  Anesthesia Plan: General   Post-op Pain Management:    Induction: Intravenous  PONV Risk Score and Plan: 3 and Ondansetron, Treatment may vary due to age or medical condition and Midazolam  Airway Management Planned: Nasal ETT  Additional Equipment:   Intra-op Plan:   Post-operative Plan: Extubation in OR  Informed Consent: I have reviewed the patients History and Physical, chart, labs and discussed the procedure including the risks, benefits and alternatives for the proposed anesthesia with the patient or authorized representative who has indicated his/her understanding and acceptance.     Plan Discussed with: CRNA and Surgeon  Anesthesia Plan Comments:         Anesthesia Quick Evaluation

## 2017-09-13 NOTE — Anesthesia Postprocedure Evaluation (Signed)
Anesthesia Post Note  Patient: OTILA STARN  Procedure(s) Performed: DENTAL RESTORATION/EXTRACTION (N/A Mouth)     Patient location during evaluation: PACU Anesthesia Type: General Level of consciousness: awake and alert Pain management: pain level controlled Vital Signs Assessment: post-procedure vital signs reviewed and stable Respiratory status: spontaneous breathing, nonlabored ventilation, respiratory function stable and patient connected to nasal cannula oxygen Cardiovascular status: blood pressure returned to baseline and stable Postop Assessment: no apparent nausea or vomiting Anesthetic complications: no    Last Vitals:  Vitals:   09/13/17 1045 09/13/17 1100  BP: 118/68   Pulse: 97   Resp: 18   Temp:    SpO2: 95% 97%    Last Pain:  Vitals:   09/13/17 1030  TempSrc:   PainSc: 0-No pain                 Ruvi Fullenwider DAVID

## 2017-09-13 NOTE — Op Note (Signed)
NAME:  Jordan Rivas, Jordan Rivas NO.:  [id number]  MEDICAL RECORD NO.:  [id number]  LOCATION:                                 FACILITY:  PHYSICIAN:  Gae Bon, M.D.       DATE OF BIRTH:  DATE OF PROCEDURE:  09/13/2017 DATE OF DISCHARGE:                              OPERATIVE REPORT   PREOPERATIVE DIAGNOSES:  Nonrestorable teeth numbers 5, 6, 7, 9, 10, 11, 12, 26, 27, 28 secondary to dental caries and periodontal disease.  POSTOPERATIVE DIAGNOSES:  Nonrestorable teeth numbers 5, 6, 7, 9, 10, 11, 12, 26, 27, 28 secondary to dental caries and periodontal disease.  PROCEDURE:  Extraction of teeth numbers 5, 6, 7, 9, 10, 11, 12, 26, 27, 28; alveoplasty of right and left maxilla, alveoplasty of right mandible.  SURGEON:  Gae Bon, M.D.  ANESTHESIA:  General.  Dr. Conrad Langdon attending.  Nasal intubation.  DESCRIPTION OF PROCEDURE:  The patient was taken to the operating room, placed on the table in supine position.  General anesthesia was administered intravenously and a nasal endotracheal tube was placed and secured.  The eyes were protected and the patient was draped for surgery.  Time-out was performed.  The posterior pharynx was suctioned and a throat pack was placed.  A 2% lidocaine with 1:100,000 epinephrine was infiltrated in an inferior alveolar block on the right side and then buccal and palatal infiltration in the right and left maxilla, both buccally and palatally around the teeth to be removed.  A bite block was placed in the right side of the mouth and a sweetheart retractor was used to retract the tongue.  A #15 blade used to make an incision buccally and palatally in the gingival sulcus of teeth numbers 9, 10, 11, and 12.  The periosteum was reflected with a periosteal elevator. The teeth were elevated with a 301 elevator and removed from the mouth with a dental forceps.  The sockets were curetted.  Then, crestal incision was made 1 cm proximal and  distal to the ends of the incision and the periosteum was reflected so that the alveolar crest could be exposed.  Alveoplasty was performed using the egg-shaped bur and bone file.  Then, the area was irrigated and closed with 3-0 chromic.  The bite block and sweetheart retractor were repositioned to the other side of the mouth.  A 15 blade was used to make an incision around teeth numbers 4, 5, and 6 with proximal and distal extensions along the alveolar crest as well as around teeth numbers 26, 27, 28 with proximal and distal incision extensions 1 cm on the alveolar crest.  The periosteum was reflected from these teeth.  Teeth were elevated with 301 elevator and removed from the mouth with dental forceps.  The sockets were curetted.  Then, the periosteum was reflected to expose the alveolar bone, which was irregular in contour.  Then, the egg-shaped bur and bone file were used to perform alveoplasty in the right maxilla and right mandible.  Then, the areas were irrigated and closed with 3-0 chromic.  Local anesthetic was administered and a left mandibular block to achieve anesthesia  in the anterior mandible on the left side.  Then, the oral cavity was irrigated and suctioned.  Throat pack was removed. The patient was left in care of anesthesia for extubation and transport to recovery room with plans for discharge to home through day surgery.  ESTIMATED BLOOD LOSS:  Minimal.  COMPLICATIONS:  None.  SPECIMENS:  None.     Gae Bon, M.D.     SMJ/MEDQ  D:  09/13/2017  T:  09/13/2017  Job:  [id number]

## 2017-09-13 NOTE — H&P (Signed)
H&P documentation  -History and Physical Reviewed  -Patient has been re-examined  -No change in the plan of care  Jordan Rivas  

## 2017-09-13 NOTE — Anesthesia Procedure Notes (Signed)
Procedure Name: Intubation Date/Time: 09/13/2017 9:03 AM Performed by: Clearnce Sorrel, CRNA Pre-anesthesia Checklist: Patient identified, Emergency Drugs available, Suction available, Patient being monitored and Timeout performed Patient Re-evaluated:Patient Re-evaluated prior to induction Oxygen Delivery Method: Circle system utilized Preoxygenation: Pre-oxygenation with 100% oxygen Induction Type: IV induction Ventilation: Mask ventilation without difficulty and Oral airway inserted - appropriate to patient size Laryngoscope Size: Mac and 3 Grade View: Grade II Tube type: Oral Nasal Tubes: Nasal Rae Tube size: 6.5 mm Number of attempts: 2 Placement Confirmation: ETT inserted through vocal cords under direct vision,  positive ETCO2 and breath sounds checked- equal and bilateral Tube secured with: Tape Dental Injury: Teeth and Oropharynx as per pre-operative assessment

## 2017-09-13 NOTE — Op Note (Signed)
09/13/2017  9:32 AM  PATIENT:  Ardelia Mems  57 y.o. female  PRE-OPERATIVE DIAGNOSIS:  NON RESTORABLE TEETH # 5, 6, 7, 9, 10, 11, 12, 26, 27, 28  POST-OPERATIVE DIAGNOSIS:  SAME  PROCEDURE:  Procedure(s): EXTRACTION TEETH # 5, 6, 7, 9, 10, 11, 12, 26, 27, 28, ALVEOLOPLASTY RIGHT AND LEFT MAXILLA, ALVEOLOPLASTY RIGHT MANDIBLE  SURGEON:  Surgeon(s): Diona Browner, DDS  ANESTHESIA:   local and general  EBL:  minimal  DRAINS: none   SPECIMEN:  No Specimen  COUNTS:  YES  PLAN OF CARE: Discharge to home after PACU  PATIENT DISPOSITION:  PACU - hemodynamically stable.   PROCEDURE DETAILS: Dictation # [id number] Gae Bon, DMD 09/13/2017 9:32 AM

## 2017-09-13 NOTE — Transfer of Care (Signed)
Immediate Anesthesia Transfer of Care Note  Patient: Jordan Rivas  Procedure(s) Performed: DENTAL RESTORATION/EXTRACTION (N/A Mouth)  Patient Location: PACU  Anesthesia Type:General  Level of Consciousness: awake, alert  and oriented  Airway & Oxygen Therapy: Patient Spontanous Breathing and Patient connected to nasal cannula oxygen  Post-op Assessment: Report given to RN and Post -op Vital signs reviewed and stable  Post vital signs: Reviewed and stable  Last Vitals:  Vitals:   09/13/17 0822  BP: (!) 131/57  Pulse: 80  Resp: 16  Temp: 36.6 C  SpO2: 96%    Last Pain:  Vitals:   09/13/17 0822  TempSrc: Oral      Patients Stated Pain Goal: 3 (58/59/29 2446)  Complications: No apparent anesthesia complications

## 2017-09-14 ENCOUNTER — Encounter (HOSPITAL_COMMUNITY): Payer: Self-pay | Admitting: Oral Surgery

## 2017-10-30 ENCOUNTER — Encounter: Payer: Self-pay | Admitting: Neurology

## 2017-10-30 ENCOUNTER — Telehealth: Payer: Self-pay | Admitting: Neurology

## 2017-10-30 ENCOUNTER — Telehealth: Payer: Self-pay | Admitting: *Deleted

## 2017-10-30 ENCOUNTER — Ambulatory Visit: Payer: Medicare Other | Admitting: Neurology

## 2017-10-30 NOTE — Telephone Encounter (Signed)
Canceled Botox the day of her appt.

## 2017-10-30 NOTE — Telephone Encounter (Signed)
Pt called wanting to reschedule her appt for today 2/27

## 2017-10-31 NOTE — Telephone Encounter (Signed)
I called to reschedule the patient but she did not answer so I left a VM asking her to call me back.

## 2017-11-04 ENCOUNTER — Other Ambulatory Visit: Payer: Self-pay

## 2017-11-04 MED ORDER — NAPROXEN 500 MG PO TABS: 500.0000 mg | ORAL_TABLET | Freq: Two times a day (BID) | ORAL | 0 refills | Status: DC

## 2017-11-06 NOTE — Telephone Encounter (Signed)
Pt r/s botox appt to 12/03/17.  FYI

## 2017-11-07 NOTE — Telephone Encounter (Signed)
Noted, thank you

## 2017-11-20 ENCOUNTER — Other Ambulatory Visit: Payer: Self-pay | Admitting: Neurology

## 2017-12-01 ENCOUNTER — Other Ambulatory Visit: Payer: Self-pay | Admitting: Neurology

## 2017-12-02 DIAGNOSIS — E782 Mixed hyperlipidemia: Secondary | ICD-10-CM | POA: Diagnosis not present

## 2017-12-03 ENCOUNTER — Ambulatory Visit: Payer: Medicare Other | Admitting: Neurology

## 2017-12-03 ENCOUNTER — Telehealth: Payer: Self-pay | Admitting: *Deleted

## 2017-12-03 DIAGNOSIS — E782 Mixed hyperlipidemia: Secondary | ICD-10-CM | POA: Diagnosis not present

## 2017-12-03 NOTE — Telephone Encounter (Signed)
Canceled same day as appt due to mother passing away.

## 2017-12-04 ENCOUNTER — Encounter: Payer: Self-pay | Admitting: Neurology

## 2017-12-04 DIAGNOSIS — M79602 Pain in left arm: Secondary | ICD-10-CM | POA: Diagnosis not present

## 2017-12-04 DIAGNOSIS — E782 Mixed hyperlipidemia: Secondary | ICD-10-CM | POA: Diagnosis not present

## 2017-12-04 DIAGNOSIS — M79606 Pain in leg, unspecified: Secondary | ICD-10-CM | POA: Diagnosis not present

## 2017-12-04 DIAGNOSIS — M25552 Pain in left hip: Secondary | ICD-10-CM | POA: Diagnosis not present

## 2017-12-04 DIAGNOSIS — G894 Chronic pain syndrome: Secondary | ICD-10-CM | POA: Diagnosis not present

## 2017-12-05 ENCOUNTER — Ambulatory Visit: Payer: Medicare Other | Admitting: Podiatry

## 2017-12-05 DIAGNOSIS — E782 Mixed hyperlipidemia: Secondary | ICD-10-CM | POA: Diagnosis not present

## 2017-12-06 DIAGNOSIS — E782 Mixed hyperlipidemia: Secondary | ICD-10-CM | POA: Diagnosis not present

## 2017-12-07 DIAGNOSIS — E782 Mixed hyperlipidemia: Secondary | ICD-10-CM | POA: Diagnosis not present

## 2017-12-08 DIAGNOSIS — E782 Mixed hyperlipidemia: Secondary | ICD-10-CM | POA: Diagnosis not present

## 2017-12-09 DIAGNOSIS — E782 Mixed hyperlipidemia: Secondary | ICD-10-CM | POA: Diagnosis not present

## 2017-12-10 DIAGNOSIS — E782 Mixed hyperlipidemia: Secondary | ICD-10-CM | POA: Diagnosis not present

## 2017-12-11 DIAGNOSIS — E782 Mixed hyperlipidemia: Secondary | ICD-10-CM | POA: Diagnosis not present

## 2017-12-12 DIAGNOSIS — E782 Mixed hyperlipidemia: Secondary | ICD-10-CM | POA: Diagnosis not present

## 2017-12-13 DIAGNOSIS — E782 Mixed hyperlipidemia: Secondary | ICD-10-CM | POA: Diagnosis not present

## 2017-12-14 DIAGNOSIS — E782 Mixed hyperlipidemia: Secondary | ICD-10-CM | POA: Diagnosis not present

## 2017-12-15 DIAGNOSIS — E782 Mixed hyperlipidemia: Secondary | ICD-10-CM | POA: Diagnosis not present

## 2017-12-16 DIAGNOSIS — E782 Mixed hyperlipidemia: Secondary | ICD-10-CM | POA: Diagnosis not present

## 2017-12-17 ENCOUNTER — Ambulatory Visit: Payer: Medicare Other | Admitting: Podiatry

## 2017-12-17 DIAGNOSIS — E782 Mixed hyperlipidemia: Secondary | ICD-10-CM | POA: Diagnosis not present

## 2017-12-18 DIAGNOSIS — E782 Mixed hyperlipidemia: Secondary | ICD-10-CM | POA: Diagnosis not present

## 2017-12-19 ENCOUNTER — Telehealth: Payer: Self-pay | Admitting: Neurology

## 2017-12-19 ENCOUNTER — Ambulatory Visit: Payer: Medicare Other | Admitting: Podiatry

## 2017-12-19 DIAGNOSIS — E782 Mixed hyperlipidemia: Secondary | ICD-10-CM | POA: Diagnosis not present

## 2017-12-19 NOTE — Telephone Encounter (Signed)
Pt called to r/s botox appt. She has 3 no show appts, 12/03/17  10/30/17 and 05/08/18. Can this appt be r/s?

## 2017-12-20 DIAGNOSIS — E782 Mixed hyperlipidemia: Secondary | ICD-10-CM | POA: Diagnosis not present

## 2017-12-21 DIAGNOSIS — E782 Mixed hyperlipidemia: Secondary | ICD-10-CM | POA: Diagnosis not present

## 2017-12-22 DIAGNOSIS — E782 Mixed hyperlipidemia: Secondary | ICD-10-CM | POA: Diagnosis not present

## 2017-12-23 DIAGNOSIS — E782 Mixed hyperlipidemia: Secondary | ICD-10-CM | POA: Diagnosis not present

## 2017-12-24 DIAGNOSIS — E782 Mixed hyperlipidemia: Secondary | ICD-10-CM | POA: Diagnosis not present

## 2017-12-24 NOTE — Telephone Encounter (Signed)
Noted, thank you

## 2017-12-24 NOTE — Telephone Encounter (Signed)
I called the patient and advised her this appt could be r/s one more time at this time. Botox appt scheduled for 6/19, she asked to be put on the wait list, I did so, I reminded her she needed to keep this appt. She understood and was appreciative    [phone]

## 2017-12-25 DIAGNOSIS — E782 Mixed hyperlipidemia: Secondary | ICD-10-CM | POA: Diagnosis not present

## 2017-12-26 DIAGNOSIS — E782 Mixed hyperlipidemia: Secondary | ICD-10-CM | POA: Diagnosis not present

## 2017-12-27 DIAGNOSIS — E782 Mixed hyperlipidemia: Secondary | ICD-10-CM | POA: Diagnosis not present

## 2017-12-28 DIAGNOSIS — E782 Mixed hyperlipidemia: Secondary | ICD-10-CM | POA: Diagnosis not present

## 2017-12-29 DIAGNOSIS — E782 Mixed hyperlipidemia: Secondary | ICD-10-CM | POA: Diagnosis not present

## 2017-12-30 ENCOUNTER — Other Ambulatory Visit: Payer: Self-pay | Admitting: Neurology

## 2017-12-30 DIAGNOSIS — E782 Mixed hyperlipidemia: Secondary | ICD-10-CM | POA: Diagnosis not present

## 2017-12-31 DIAGNOSIS — E782 Mixed hyperlipidemia: Secondary | ICD-10-CM | POA: Diagnosis not present

## 2018-01-03 ENCOUNTER — Ambulatory Visit (INDEPENDENT_AMBULATORY_CARE_PROVIDER_SITE_OTHER): Payer: Medicare Other | Admitting: Podiatry

## 2018-01-03 ENCOUNTER — Encounter: Payer: Self-pay | Admitting: Podiatry

## 2018-01-03 VITALS — BP 121/68 | HR 82 | Resp 16

## 2018-01-03 DIAGNOSIS — E1149 Type 2 diabetes mellitus with other diabetic neurological complication: Secondary | ICD-10-CM

## 2018-01-03 DIAGNOSIS — M21372 Foot drop, left foot: Secondary | ICD-10-CM | POA: Diagnosis not present

## 2018-01-03 DIAGNOSIS — B351 Tinea unguium: Secondary | ICD-10-CM

## 2018-01-03 DIAGNOSIS — M79675 Pain in left toe(s): Secondary | ICD-10-CM | POA: Diagnosis not present

## 2018-01-03 DIAGNOSIS — M2041 Other hammer toe(s) (acquired), right foot: Secondary | ICD-10-CM

## 2018-01-03 DIAGNOSIS — M79674 Pain in right toe(s): Secondary | ICD-10-CM | POA: Diagnosis not present

## 2018-01-03 DIAGNOSIS — M2042 Other hammer toe(s) (acquired), left foot: Secondary | ICD-10-CM | POA: Diagnosis not present

## 2018-01-05 NOTE — Progress Notes (Signed)
Subjective:   Patient ID: Jordan Rivas, female   DOB: 57 y.o.   MRN: [id number]   HPI 57 year old female presents the office today requesting diabetic shoes.  She does have history of drop and she was placed in the left side she has difficulty finding shoes to foot and this.  The shoes that she has a couple years old.  She denies any history of ulceration.  She states that she like to have her nails trimmed today as they are elongated she cannot trim them herself near causing irritation inside shoes.  PCP: Dr. Brigitte Pulse  Last A1c 6.5 (09/13/2017)   Review of Systems  All other systems reviewed and are negative.  Past Medical History:  Diagnosis Date  . Chronic pain    "in this left arm and leg that don't move"  . CVA (cerebrovascular accident) ([place]) 1995   "left side paralyzed"  . Dental caries   . Depression   . Diabetes mellitus without complication ([place])   . GERD (gastroesophageal reflux disease)   . Headache   . Hypercholesteremia   . Hypertension   . Insomnia   . Neuropathy   . Overactive bladder   . Peripheral vascular disease ([place])    left arm and leg; since stroke 1995  . Pneumonia   . Seizures ([place]) 01/27/12   "first one ever"    Past Surgical History:  Procedure Laterality Date  . CEREBRAL ANEURYSM REPAIR  11/1993   "left my left side paralyzed"  . DENTAL RESTORATION/EXTRACTION WITH X-RAY N/A 09/13/2017   Procedure: DENTAL RESTORATION/EXTRACTION;  Surgeon: Diona Browner, DDS;  Location: [facility];  Service: Oral Surgery;  Laterality: N/A;  . TUBAL LIGATION  04/1984     Current Outpatient Medications:  .  Alcohol Swabs (ALCOHOL PREP) 70 % PADS, , Disp: , Rfl:  .  amitriptyline (ELAVIL) 100 MG tablet, Take 100 mg by mouth at bedtime. , Disp: , Rfl: 0 .  AQUALANCE LANCETS 30G MISC, , Disp: , Rfl:  .  aspirin 81 MG chewable tablet, Chew 1 tablet (81 mg total) by mouth daily., Disp: 30 tablet, Rfl: 3 .  atorvastatin (LIPITOR) 40 MG tablet, Take 40 mg by mouth daily. , Disp:  , Rfl:  .  Blood Glucose Calibration (ONETOUCH VERIO) SOLN, , Disp: , Rfl:  .  CARAFATE 1 GM/10ML suspension, , Disp: , Rfl: 0 .  Cholecalciferol (VITAMIN D) 1000 UNITS capsule, Take 1,000 Units by mouth daily., Disp: , Rfl:  .  DULoxetine (CYMBALTA) 60 MG capsule, Take 60 mg by mouth daily., Disp: , Rfl:  .  esomeprazole (NEXIUM) 40 MG capsule, Take 40 mg by mouth 2 (two) times daily., Disp: , Rfl:  .  ketoconazole (NIZORAL) 2 % cream, Apply 1 application topically 2 (two) times daily as needed for irritation., Disp: , Rfl:  .  levETIRAcetam (KEPPRA) 500 MG tablet, TAKE 1 TABLET BY MOUTH TWICE A DAY, Disp: 180 tablet, Rfl: 0 .  lidocaine (XYLOCAINE) 5 % ointment, Apply 1 application topically 4 (four) times daily as needed for moderate pain. , Disp: , Rfl:  .  LYRICA 75 MG capsule, take 1 capsule by mouth three times a day (Patient taking differently: Take 75 mg by mouth 3 times daily), Disp: 270 capsule, Rfl: 0 .  meloxicam (MOBIC) 15 MG tablet, Take 15 mg by mouth daily., Disp: , Rfl:  .  metFORMIN (GLUCOPHAGE) 500 MG tablet, Take 500 mg by mouth daily., Disp: , Rfl: 1 .  metoprolol succinate (  TOPROL-XL) 25 MG 24 hr tablet, Take 1 tablet (25 mg total) by mouth daily., Disp: 30 tablet, Rfl: 3 .  naproxen (NAPROSYN) 500 MG tablet, Take 1 tablet (500 mg total) by mouth 2 (two) times daily with a meal. Use only as needed., Disp: 180 tablet, Rfl: 0 .  ONETOUCH VERIO test strip, , Disp: , Rfl:  .  oxybutynin (DITROPAN-XL) 10 MG 24 hr tablet, Take 10 mg by mouth 2 (two) times daily., Disp: , Rfl:  .  oxyCODONE (ROXICODONE) 15 MG immediate release tablet, Take 15 mg by mouth 4 (four) times daily as needed for pain. , Disp: , Rfl:   Allergies  Allergen Reactions  . Celebrex [Celecoxib] Swelling and Other (See Comments)    "lips; it was terrible"  . Baclofen Swelling  . Aspirin   . Zolpidem     Social History   Socioeconomic History  . Marital status: Divorced    Spouse name: Not on file  .  Number of children: 2  . Years of education: Not on file  . Highest education level: Not on file  Occupational History  . Not on file  Social Needs  . Financial resource strain: Not on file  . Food insecurity:    Worry: Not on file    Inability: Not on file  . Transportation needs:    Medical: Not on file    Non-medical: Not on file  Tobacco Use  . Smoking status: Never Smoker  . Smokeless tobacco: Never Used  Substance and Sexual Activity  . Alcohol use: No  . Drug use: No  . Sexual activity: Yes  Lifestyle  . Physical activity:    Days per week: Not on file    Minutes per session: Not on file  . Stress: Not on file  Relationships  . Social connections:    Talks on phone: Not on file    Gets together: Not on file    Attends religious service: Not on file    Active member of club or organization: Not on file    Attends meetings of clubs or organizations: Not on file    Relationship status: Not on file  . Intimate partner violence:    Fear of current or ex partner: Not on file    Emotionally abused: Not on file    Physically abused: Not on file    Forced sexual activity: Not on file  Other Topics Concern  . Not on file  Social History Narrative  . Not on file         Objective:  Physical Exam  General: AAO x3, NAD  Dermatological: Bilateral hallux and second digit toenails are hypertrophic, dystrophic and elongated.  There is tenderness palpation the nails 1-2 bilaterally.  There is no open lesions or pre-ulcerative lesion identified at this time.  Vascular: Dorsalis Pedis artery and Posterior Tibial artery pedal pulses are 2/4 bilateral with immedate capillary fill time.  There is no pain with calf compression, swelling, warmth, erythema.   Neruologic: Sensation absent in the left foot but appears to be intact in the right foot with Derrel Nip monofilament.  Dropfoot is present in the left foot.  Musculoskeletal: Hammertoe contractures are present.   Dropfoot deformity is present on left foot.  Muscular strength 5/5 in all groups tested bilateral.  No other areas of tenderness identified.  She has an AFO brace that she is wearing in shoes that are very worn out.     Assessment:  57 year old female presents the office requesting nail debridement as well as diabetic shoes     Plan:  -Treatment options discussed including all alternatives, risks, and complications -Etiology of symptoms were discussed -Nails debrided 4 without complications or bleeding.  The other nails do not need to be trimmed today but they were smoothed. -Paperwork was completed for precertification diabetic shoes.  Given her history of drop foot, decreased sensation left foot as well as diabetes with digital deformity of the think should benefit from a diabetic shoe.  I will have her follow-up with [facility] for molding. -Daily foot inspection -Follow-up in 3 months or sooner if any problems arise. In the meantime, encouraged to call the office with any questions, concerns, change in symptoms.   Trula Slade DPM

## 2018-01-07 ENCOUNTER — Other Ambulatory Visit: Payer: Medicare Other

## 2018-01-15 ENCOUNTER — Other Ambulatory Visit: Payer: Self-pay | Admitting: Neurology

## 2018-01-17 ENCOUNTER — Other Ambulatory Visit: Payer: Self-pay | Admitting: Neurology

## 2018-02-12 ENCOUNTER — Other Ambulatory Visit: Payer: Self-pay | Admitting: Neurology

## 2018-02-19 ENCOUNTER — Encounter

## 2018-02-19 ENCOUNTER — Ambulatory Visit (INDEPENDENT_AMBULATORY_CARE_PROVIDER_SITE_OTHER): Payer: Medicare Other | Admitting: Neurology

## 2018-02-19 ENCOUNTER — Encounter: Payer: Self-pay | Admitting: Neurology

## 2018-02-19 VITALS — BP 132/78 | HR 72 | Ht 61.0 in | Wt 187.5 lb

## 2018-02-19 DIAGNOSIS — I69354 Hemiplegia and hemiparesis following cerebral infarction affecting left non-dominant side: Secondary | ICD-10-CM

## 2018-02-19 NOTE — Progress Notes (Signed)
PATIENT: Jordan Rivas DOB: 09-18-1960  HISTORICAL  Jordan Rivas is on 57 years old African American female, accompanied by her daughter for evaluation of seizure, left spastic hemiparesis  She had a history of right hemisphere hemorrhagic stroke due to brain aneurysm per patient at age 17, was residual severe left spastic hemiparesis, left visual field cut, profound gait difficulty,  She presented with her only seizure in Jan 26 2013, she woke up in the middle of the night using at home, while walking towards the bathroom, she fell, with witnessed seizure-like activity, tongue biting, woke up confused, at the hospital  MRI of the brain demonstrate extensive encephalomalacia involving right basal ganglion, perisylvian fissure region, no acute lesions, she has been treated with Keppra 500 mg twice a day, she has no recurrent seizure,  She continues to complain deep body aching pain at the left side, involving her left upper and lower extremity, specificity, difficulty straightening up her left finger, left leg spasm, she reported improvement of her left lower extremity spasm after receiving Botox injection in the past, but it was many years ago,  She also complains of couple years history of frequent headaches, getting worse almost daily basis, she has been taking frequent Excedrin migraines, left retro-orbital area severe pounding headache was associated light, noise sensitivity,  UPDATE July 1st 2015: Patient returned for EMG guided Botox injection for her spastic left upper, and the lower extremity, potential side effects explained, she wants the injection to emphasize on her left ankle plantarflexion, and left finger flexion  UPDATE Nov 26th 2015: She responded very well to her first EMG guided Botox injection in July first 2015, she received 400 units, she reported less spasticity of her left arm, left foot, ambulate better, less left leg pain, no significant side effect  noticed  UPDATE Feb 24th 2016: She responded to previous injection in November 2015, especially her left upper extremity, no significant side effect,  She has no recurrent seizure, taking Keppra 500 mg twice a day She has chronic insomnia, taking Ambien 5 mg 2 tablets every night  UPDATE February 24 2015: Last injection was in October 27 2014, she responded very well, she can loosen her left shoulder much better, no significant side effect  Update September 21 2015: She responded very well to previous EMG guided Botox injection, she can straight out her finger better, no significant side effect notice, no recurrent seizure taking Keppra 500 mg twice a day  Update December 22 2015: She responded very well to previous injection significant side effect noticed, she now complains left knee hyperextension left knee pain, finger flexion vomiting position, she has no recurrent seizure, now taking Keppra 500 mg twice a day,  Update April 24 2016: She had no recurrent seizure, taking Keppra 500 mg twice a day, previous Botox ingestion has helped relaxed her shoulder, wrist, she complains of left  knee pain, tends to hyperextend her knee  UPDATE Nov 29th 2017: She responded well to previous injection but continue have significant left shoulder tightness, left foot tightness, especially left toes crunch underneath while ambulating  Update December 26 2016: She responded very well to previous Botox injection but his 5 month out from previous injection in November 2017, she noticed significant tightness of left shoulder, left forearm in position, left ankle plantarflexion, she had no recurrent seizure, tizanidine as needed has been helpful  UPDATE Jul 18 2017: She is accompanied by her son at today's clinic visit last injection was  in April, no she has significant spasticity of left arm shoulder, painful with passive movement  UPDATE February 19 2018: She is doing well with previous injection  REVIEW OF SYSTEMS:  Full 14 system review of systems performed and notable only for: Fatigue, hearing loss, ringing in ears, runny nose, eye itching, light sensitivity, blurred vision, cold intolerance, heat intolerance, excessive thirst, swollen abdomen, abdominal pain, constipation, diarrhea, insomnia, daytime sleepiness, sleep talking, frequency of urination, joint pain, back neck stiffness, rash, itching, bruise easily, dizziness, numbness, weakness, decreased concentration, depression, anxiety achy muscles, walking difficulty,   ALLERGIES: Allergies  Allergen Reactions  . Celebrex [Celecoxib] Swelling and Other (See Comments)    "lips; it was terrible"  . Baclofen Swelling  . Aspirin   . Zolpidem     HOME MEDICATIONS: Current Outpatient Medications on File Prior to Visit  Medication Sig Dispense Refill  . Alcohol Swabs (ALCOHOL PREP) 70 % PADS     . amitriptyline (ELAVIL) 100 MG tablet Take 100 mg by mouth at bedtime.   0  . AQUALANCE LANCETS 30G MISC     . aspirin 81 MG chewable tablet Chew 1 tablet (81 mg total) by mouth daily. 30 tablet 3  . atorvastatin (LIPITOR) 40 MG tablet Take 40 mg by mouth daily.     . Blood Glucose Calibration (ONETOUCH VERIO) SOLN     . botulinum toxin Type A (BOTOX) 100 units SOLR injection Inject 500 Units into the muscle every 3 (three) months.    Marland Kitchen CARAFATE 1 GM/10ML suspension   0  . Cholecalciferol (VITAMIN D) 1000 UNITS capsule Take 1,000 Units by mouth daily.    . DULoxetine (CYMBALTA) 60 MG capsule Take 60 mg by mouth daily.    Marland Kitchen esomeprazole (NEXIUM) 40 MG capsule Take 40 mg by mouth 2 (two) times daily.    Marland Kitchen ketoconazole (NIZORAL) 2 % cream Apply 1 application topically 2 (two) times daily as needed for irritation.    . levETIRAcetam (KEPPRA) 500 MG tablet TAKE 1 TABLET BY MOUTH TWICE A DAY 180 tablet 0  . lidocaine (XYLOCAINE) 5 % ointment Apply 1 application topically 4 (four) times daily as needed for moderate pain.     . meloxicam (MOBIC) 15 MG tablet Take  15 mg by mouth daily.    . metFORMIN (GLUCOPHAGE) 500 MG tablet Take 500 mg by mouth daily.  1  . metoprolol succinate (TOPROL-XL) 25 MG 24 hr tablet Take 1 tablet (25 mg total) by mouth daily. 30 tablet 3  . naproxen (NAPROSYN) 500 MG tablet Take 1 tablet (500 mg total) by mouth 2 (two) times daily with a meal. Use only as needed. 180 tablet 0  . ONETOUCH VERIO test strip     . oxybutynin (DITROPAN-XL) 10 MG 24 hr tablet Take 10 mg by mouth 2 (two) times daily.    Marland Kitchen oxyCODONE (ROXICODONE) 15 MG immediate release tablet Take 15 mg by mouth 4 (four) times daily as needed for pain.     . pregabalin (LYRICA) 75 MG capsule Take 75 mg by mouth 3 times daily 270 capsule 0  . tiZANidine (ZANAFLEX) 2 MG tablet TAKE 2 TABLETS BY MOUTH THREE TIMES A DAY 180 tablet 0   No current facility-administered medications on file prior to visit.     PAST MEDICAL HISTORY: Past Medical History:  Diagnosis Date  . Chronic pain    "in this left arm and leg that don't move"  . CVA (cerebrovascular accident) [facility]) 1995   "left  side paralyzed"  . Dental caries   . Depression   . Diabetes mellitus without complication ([place])   . GERD (gastroesophageal reflux disease)   . Headache   . Hypercholesteremia   . Hypertension   . Insomnia   . Neuropathy   . Overactive bladder   . Peripheral vascular disease ([place])    left arm and leg; since stroke 1995  . Pneumonia   . Seizures ([place]) 01/27/12   "first one ever"    PAST SURGICAL HISTORY: Past Surgical History:  Procedure Laterality Date  . CEREBRAL ANEURYSM REPAIR  11/1993   "left my left side paralyzed"  . DENTAL RESTORATION/EXTRACTION WITH X-RAY N/A 09/13/2017   Procedure: DENTAL RESTORATION/EXTRACTION;  Surgeon: Diona Browner, DDS;  Location: [facility];  Service: Oral Surgery;  Laterality: N/A;  . TUBAL LIGATION  04/1984    FAMILY HISTORY: Family History  Problem Relation Age of Onset  . Dementia Mother   . Diabetes type II Mother   . Cancer Mother   .  Diabetes Mother   . Heart attack Father     SOCIAL HISTORY:  Social History   Socioeconomic History  . Marital status: Divorced    Spouse name: Not on file  . Number of children: 2  . Years of education: Not on file  . Highest education level: Not on file  Occupational History  . Not on file  Social Needs  . Financial resource strain: Not on file  . Food insecurity:    Worry: Not on file    Inability: Not on file  . Transportation needs:    Medical: Not on file    Non-medical: Not on file  Tobacco Use  . Smoking status: Never Smoker  . Smokeless tobacco: Never Used  Substance and Sexual Activity  . Alcohol use: No  . Drug use: No  . Sexual activity: Yes  Lifestyle  . Physical activity:    Days per week: Not on file    Minutes per session: Not on file  . Stress: Not on file  Relationships  . Social connections:    Talks on phone: Not on file    Gets together: Not on file    Attends religious service: Not on file    Active member of club or organization: Not on file    Attends meetings of clubs or organizations: Not on file    Relationship status: Not on file  . Intimate partner violence:    Fear of current or ex partner: Not on file    Emotionally abused: Not on file    Physically abused: Not on file    Forced sexual activity: Not on file  Other Topics Concern  . Not on file  Social History Narrative  . Not on file     PHYSICAL EXAM   Vitals:   02/19/18 1339  BP: 132/78  Pulse: 72  Weight: 187 lb 8 oz (85 kg)  Height: 5\' 1"  (1.549 m)    Not recorded      Body mass index is 35.43 kg/m.   Generalized: In no acute distress  Neck: Supple, no carotid bruits   Cardiac: Regular rate rhythm  Pulmonary: Clear to auscultation bilaterally  Musculoskeletal: No deformity  Neurological examination  Mentation: Alert oriented to time, place, history taking, and causual conversation  Cranial nerve II-XII: Pupils were equal round reactive to light.  Extraocular movements were full.  Left visual field cut.  Left lower face weakness. Hearing was intact to finger  rubbing bilaterally. Uvula tongue midline.  Head turning were normal and symmetric.Tongue protrusion into cheek strength was normal.  Motor: Dense spastic left hemiparesis, left hand thumb in position, proximal and distal strength is 1 out of 5, left lower extremity 1 out of 5, left knee extension, ankle plantarflexion, when she ambulate, she complains of painful left toe flexion, left ankle plantarflexion  Sensory: Intact to fine touch, pinprick, preserved vibratory sensation, and proprioception at toes.  Coordination: Normal finger to nose, heel-to-shin bilaterally there was no truncal ataxia  Gait: Spastic left hemi-circumferential gait, unsteady  Deep tendon reflexes: Left hyperreflexia   DIAGNOSTIC DATA (LABS, IMAGING, TESTING) - I reviewed patient records, labs, notes, testing and imaging myself where available.  Lab Results  Component Value Date   WBC 12.5 (H) 09/13/2017   HGB 11.0 (L) 09/13/2017   HCT 35.5 (L) 09/13/2017   MCV 79.2 09/13/2017   PLT 353 09/13/2017      Component Value Date/Time   NA 137 09/13/2017 0814   K 3.9 09/13/2017 0814   CL 100 (L) 09/13/2017 0814   CO2 28 09/13/2017 0814   GLUCOSE 112 (H) 09/13/2017 0814   BUN 9 09/13/2017 0814   CREATININE 0.76 09/13/2017 0814   CALCIUM 8.9 09/13/2017 0814   PROT 6.3 02/22/2013 0430   ALBUMIN 2.6 (L) 02/22/2013 0430   AST 21 02/22/2013 0430   ALT 23 02/22/2013 0430   ALKPHOS 93 02/22/2013 0430   BILITOT 0.1 (L) 02/22/2013 0430   GFRNONAA >60 09/13/2017 0814   GFRAA >60 09/13/2017 0814   Lab Results  Component Value Date   CHOL 133 01/28/2012   HDL 38 (L) 01/28/2012   LDLCALC 73 01/28/2012   TRIG 109 01/28/2012   CHOLHDL 3.5 01/28/2012   Lab Results  Component Value Date   HGBA1C 6.5 (H) 09/13/2017   ASSESSMENT AND PLAN  Jordan Rivas is a 57 y.o. female complains of  right hemisphere  hemorrhagic stroke, at age 47, in 2003  complex partial seizure,   doing well with Keppra 500 mg twice a day,   Spastic left hemiparesis EMG guided guided Botox injection, used 500 units of Botox A    Left palmaris longus 50 Left flexor digitorum profundi 50 units Left opponens 25 units  Left flexor carpi ulnaris 25  unit Left pronator teres 50  units   Left pectoralis major 100 units Left teres major 25 x2=50 units Left latissimus dorsi 25x2=50 units  Left brachialis 100 units   She tolerated the injection well, will return to clinic in 3 months for repeat injection, need 500 units of BOTOX A     Marcial Pacas, M.D. Ph.D.  [facility] [address], [place],  [address] [phone]

## 2018-02-19 NOTE — Progress Notes (Signed)
**  Botox 100 units x 5 vials, NDC 1834-3735-78, Lot [id number], Exp 09/2019, specialty pharmacy.//mck,rn**

## 2018-02-20 ENCOUNTER — Telehealth: Payer: Self-pay | Admitting: Podiatry

## 2018-02-20 NOTE — Telephone Encounter (Signed)
Pt left voicemail asking about r/s her appt for shoes/braces..  I returned call and we have not gotten the shoes back yet and I would schedule an appt when they come in and at that time we could mold for brace also.  I called [employer] and they are checking status and will let me know what is going on.

## 2018-02-21 NOTE — Telephone Encounter (Signed)
Called pt to discuss last time pt got the brace and for some reason Betha only cast one foot and was verifying with pt. We may get her in next Tuesday to see Benjie Karvonen if pt is able to make it.

## 2018-02-21 NOTE — Telephone Encounter (Signed)
Pt is always in a brace on left foot and it has been about 8 yrs since she got the brace. I told pt I will call when the shoes come in.  I notified [employer] to go ahead and process the inserts for the one foot since pt is in a brace.Marland Kitchen

## 2018-03-10 ENCOUNTER — Other Ambulatory Visit: Payer: Self-pay | Admitting: Neurology

## 2018-03-12 ENCOUNTER — Ambulatory Visit: Payer: Medicare Other | Admitting: Orthotics

## 2018-03-13 ENCOUNTER — Ambulatory Visit (INDEPENDENT_AMBULATORY_CARE_PROVIDER_SITE_OTHER): Payer: Medicare Other | Admitting: Orthotics

## 2018-03-13 DIAGNOSIS — E1149 Type 2 diabetes mellitus with other diabetic neurological complication: Secondary | ICD-10-CM | POA: Diagnosis not present

## 2018-03-13 DIAGNOSIS — M2041 Other hammer toe(s) (acquired), right foot: Secondary | ICD-10-CM | POA: Diagnosis not present

## 2018-03-13 DIAGNOSIS — M2042 Other hammer toe(s) (acquired), left foot: Secondary | ICD-10-CM | POA: Diagnosis not present

## 2018-03-13 DIAGNOSIS — M21372 Foot drop, left foot: Secondary | ICD-10-CM | POA: Diagnosis not present

## 2018-03-13 NOTE — Progress Notes (Signed)
Patient came in today to pick up diabetic shoes and custom inserts.  Same was well pleased with fit and function.   The patient could ambulate without any discomfort; there were no signs of any quality issues. The foot ortheses offered full contact with plantar surface and contoured the arch well.   The shoes fit well with no heel slippage and areas of pressure concern.   Patient advised to contact us if any problems arise.  Patient also advised on how to report any issues.  Patient was also evaluated for custom AFO brace.  What she needs is really out of our scope of practice due to her severe foot drop from past CVA event.  She is being referred out to [employer].

## 2018-03-27 ENCOUNTER — Ambulatory Visit: Payer: Medicare Other | Admitting: Orthotics

## 2018-03-29 ENCOUNTER — Other Ambulatory Visit: Payer: Self-pay | Admitting: Neurology

## 2018-04-07 ENCOUNTER — Ambulatory Visit: Payer: Medicare Other | Admitting: Orthotics

## 2018-04-07 DIAGNOSIS — E1149 Type 2 diabetes mellitus with other diabetic neurological complication: Secondary | ICD-10-CM

## 2018-04-07 DIAGNOSIS — M21372 Foot drop, left foot: Secondary | ICD-10-CM

## 2018-04-07 NOTE — Progress Notes (Signed)
Re ordered brace side shoe 10.5 W to accommodate AFO

## 2018-04-14 ENCOUNTER — Ambulatory Visit: Payer: Medicare Other

## 2018-04-17 ENCOUNTER — Other Ambulatory Visit: Payer: Self-pay | Admitting: Neurology

## 2018-04-23 ENCOUNTER — Ambulatory Visit: Payer: Medicare Other | Admitting: Orthotics

## 2018-04-23 DIAGNOSIS — E1149 Type 2 diabetes mellitus with other diabetic neurological complication: Secondary | ICD-10-CM

## 2018-04-23 DIAGNOSIS — B351 Tinea unguium: Secondary | ICD-10-CM

## 2018-04-23 DIAGNOSIS — M21372 Foot drop, left foot: Secondary | ICD-10-CM

## 2018-04-23 NOTE — Progress Notes (Signed)
Picked up replacement shoe..pleased.

## 2018-04-24 ENCOUNTER — Telehealth: Payer: Self-pay | Admitting: Podiatry

## 2018-04-24 NOTE — Telephone Encounter (Signed)
Pt left message stating the shoes she just got are still too small.  I returned call and offered pt an appt to see Liliane Channel to get remeasured for a different size shoe and pt stated she does not think it is the shoes she thinks it is the brace, She said it is too bulky and she is going to the person that made the brace to see if they can do anything. If needed she will call us to make an appt about the shoes.

## 2018-05-09 ENCOUNTER — Telehealth: Payer: Self-pay | Admitting: Neurology

## 2018-05-09 MED ORDER — TIZANIDINE HCL 2 MG PO TABS: ORAL_TABLET | ORAL | 1 refills | Status: DC

## 2018-05-09 NOTE — Telephone Encounter (Signed)
Patient called and requested to speak with someone regarding an rx for a muscle relaxer. She would like to know if Dr. Krista Blue will increase the dosage for her. Please call and advise.

## 2018-05-09 NOTE — Telephone Encounter (Signed)
She is currently taking tizanidine 2mg , 2 tablets TID.  Per Dr. Krista Blue, she can increase to tizanidine 2mg , 2 tablets QID.  Patient is agreeable.  New rx sent to the pharmacy.

## 2018-05-26 ENCOUNTER — Other Ambulatory Visit: Payer: Self-pay | Admitting: Neurology

## 2018-05-28 ENCOUNTER — Ambulatory Visit (INDEPENDENT_AMBULATORY_CARE_PROVIDER_SITE_OTHER): Payer: Medicare Other | Admitting: Neurology

## 2018-05-28 ENCOUNTER — Encounter: Payer: Self-pay | Admitting: Neurology

## 2018-05-28 VITALS — BP 140/84 | HR 92 | Ht 61.0 in | Wt 187.0 lb

## 2018-05-28 DIAGNOSIS — I69354 Hemiplegia and hemiparesis following cerebral infarction affecting left non-dominant side: Secondary | ICD-10-CM

## 2018-05-28 NOTE — Progress Notes (Signed)
**  Botox 100 units x 5 vials, NDC 5501-5868-25, Lot [id number], Exp 11/2020, specialty pharmacy.//mck,rn**

## 2018-05-28 NOTE — Progress Notes (Signed)
PATIENT: MYLEEN BRAILSFORD DOB: 09-18-1960  HISTORICAL  CASSANDRE OLEKSY is on 57 years old African American female, accompanied by her daughter for evaluation of seizure, left spastic hemiparesis  She had a history of right hemisphere hemorrhagic stroke due to brain aneurysm per patient at age 17, was residual severe left spastic hemiparesis, left visual field cut, profound gait difficulty,  She presented with her only seizure in Jan 26 2013, she woke up in the middle of the night using at home, while walking towards the bathroom, she fell, with witnessed seizure-like activity, tongue biting, woke up confused, at the hospital  MRI of the brain demonstrate extensive encephalomalacia involving right basal ganglion, perisylvian fissure region, no acute lesions, she has been treated with Keppra 500 mg twice a day, she has no recurrent seizure,  She continues to complain deep body aching pain at the left side, involving her left upper and lower extremity, specificity, difficulty straightening up her left finger, left leg spasm, she reported improvement of her left lower extremity spasm after receiving Botox injection in the past, but it was many years ago,  She also complains of couple years history of frequent headaches, getting worse almost daily basis, she has been taking frequent Excedrin migraines, left retro-orbital area severe pounding headache was associated light, noise sensitivity,  UPDATE July 1st 2015: Patient returned for EMG guided Botox injection for her spastic left upper, and the lower extremity, potential side effects explained, she wants the injection to emphasize on her left ankle plantarflexion, and left finger flexion  UPDATE Nov 26th 2015: She responded very well to her first EMG guided Botox injection in July first 2015, she received 400 units, she reported less spasticity of her left arm, left foot, ambulate better, less left leg pain, no significant side effect  noticed  UPDATE Feb 24th 2016: She responded to previous injection in November 2015, especially her left upper extremity, no significant side effect,  She has no recurrent seizure, taking Keppra 500 mg twice a day She has chronic insomnia, taking Ambien 5 mg 2 tablets every night  UPDATE February 24 2015: Last injection was in October 27 2014, she responded very well, she can loosen her left shoulder much better, no significant side effect  Update September 21 2015: She responded very well to previous EMG guided Botox injection, she can straight out her finger better, no significant side effect notice, no recurrent seizure taking Keppra 500 mg twice a day  Update December 22 2015: She responded very well to previous injection significant side effect noticed, she now complains left knee hyperextension left knee pain, finger flexion vomiting position, she has no recurrent seizure, now taking Keppra 500 mg twice a day,  Update April 24 2016: She had no recurrent seizure, taking Keppra 500 mg twice a day, previous Botox ingestion has helped relaxed her shoulder, wrist, she complains of left  knee pain, tends to hyperextend her knee  UPDATE Nov 29th 2017: She responded well to previous injection but continue have significant left shoulder tightness, left foot tightness, especially left toes crunch underneath while ambulating  Update December 26 2016: She responded very well to previous Botox injection but his 5 month out from previous injection in November 2017, she noticed significant tightness of left shoulder, left forearm in position, left ankle plantarflexion, she had no recurrent seizure, tizanidine as needed has been helpful  UPDATE Jul 18 2017: She is accompanied by her son at today's clinic visit last injection was  in April, no she has significant spasticity of left arm shoulder, painful with passive movement  UPDATE February 19 2018: She is doing well with previous injection  Update May 28, 2018: She did well with previous injection on February 19, 2018.  REVIEW OF SYSTEMS: Full 14 system review of systems performed and notable only for: Fatigue, hearing loss, ringing in ears, runny nose, eye itching, light sensitivity, blurred vision, cold intolerance, heat intolerance, excessive thirst, swollen abdomen, abdominal pain, constipation, diarrhea, insomnia, daytime sleepiness, sleep talking, frequency of urination, joint pain, back neck stiffness, rash, itching, bruise easily, dizziness, numbness, weakness, decreased concentration, depression, anxiety achy muscles, walking difficulty,   ALLERGIES: Allergies  Allergen Reactions  . Celebrex [Celecoxib] Swelling and Other (See Comments)    "lips; it was terrible"  . Baclofen Swelling  . Aspirin   . Zolpidem     HOME MEDICATIONS: Current Outpatient Medications on File Prior to Visit  Medication Sig Dispense Refill  . Alcohol Swabs (ALCOHOL PREP) 70 % PADS     . amitriptyline (ELAVIL) 100 MG tablet Take 100 mg by mouth at bedtime.   0  . AQUALANCE LANCETS 30G MISC     . aspirin 81 MG chewable tablet Chew 1 tablet (81 mg total) by mouth daily. 30 tablet 3  . atorvastatin (LIPITOR) 40 MG tablet Take 40 mg by mouth daily.     . Blood Glucose Calibration (ONETOUCH VERIO) SOLN     . botulinum toxin Type A (BOTOX) 100 units SOLR injection Inject 500 Units into the muscle every 3 (three) months.    Marland Kitchen CARAFATE 1 GM/10ML suspension   0  . Cholecalciferol (VITAMIN D) 1000 UNITS capsule Take 1,000 Units by mouth daily.    . DULoxetine (CYMBALTA) 60 MG capsule Take 60 mg by mouth daily.    Marland Kitchen esomeprazole (NEXIUM) 40 MG capsule Take 40 mg by mouth 2 (two) times daily.    Marland Kitchen ketoconazole (NIZORAL) 2 % cream Apply 1 application topically 2 (two) times daily as needed for irritation.    . levETIRAcetam (KEPPRA) 500 MG tablet TAKE 1 TABLET BY MOUTH 2 TIMES DAILY 180 tablet 0  . lidocaine (XYLOCAINE) 5 % ointment Apply 1 application topically 4  (four) times daily as needed for moderate pain.     . meloxicam (MOBIC) 15 MG tablet Take 15 mg by mouth daily.    . metFORMIN (GLUCOPHAGE) 500 MG tablet Take 500 mg by mouth daily.  1  . metoprolol succinate (TOPROL-XL) 25 MG 24 hr tablet Take 1 tablet (25 mg total) by mouth daily. 30 tablet 3  . naproxen (NAPROSYN) 500 MG tablet TAKE 1 TABLET BY MOUTH TWICE A DAY WITH MEALS IF NEEDED 180 tablet 0  . ONETOUCH VERIO test strip     . oxybutynin (DITROPAN-XL) 10 MG 24 hr tablet Take 10 mg by mouth 2 (two) times daily.    Marland Kitchen oxyCODONE (ROXICODONE) 15 MG immediate release tablet Take 15 mg by mouth 4 (four) times daily as needed for pain.     . pregabalin (LYRICA) 75 MG capsule TAKE ONE CAPSULE BY MOUTH THREE TIMES DAILY 270 capsule 1  . tiZANidine (ZANAFLEX) 2 MG tablet Take 2 tablets four times daily as needed for muscle spasms. 720 tablet 1   No current facility-administered medications on file prior to visit.     PAST MEDICAL HISTORY: Past Medical History:  Diagnosis Date  . Chronic pain    "in this left arm and leg that don't move"  .  CVA (cerebrovascular accident) ([place]) 1995   "left side paralyzed"  . Dental caries   . Depression   . Diabetes mellitus without complication ([place])   . GERD (gastroesophageal reflux disease)   . Headache   . Hypercholesteremia   . Hypertension   . Insomnia   . Neuropathy   . Overactive bladder   . Peripheral vascular disease ([place])    left arm and leg; since stroke 1995  . Pneumonia   . Seizures ([place]) 01/27/12   "first one ever"    PAST SURGICAL HISTORY: Past Surgical History:  Procedure Laterality Date  . CEREBRAL ANEURYSM REPAIR  11/1993   "left my left side paralyzed"  . DENTAL RESTORATION/EXTRACTION WITH X-RAY N/A 09/13/2017   Procedure: DENTAL RESTORATION/EXTRACTION;  Surgeon: Diona Browner, DDS;  Location: [facility];  Service: Oral Surgery;  Laterality: N/A;  . TUBAL LIGATION  04/1984    FAMILY HISTORY: Family History  Problem Relation Age  of Onset  . Dementia Mother   . Diabetes type II Mother   . Cancer Mother   . Diabetes Mother   . Heart attack Father     SOCIAL HISTORY:  Social History   Socioeconomic History  . Marital status: Divorced    Spouse name: Not on file  . Number of children: 2  . Years of education: Not on file  . Highest education level: Not on file  Occupational History  . Not on file  Social Needs  . Financial resource strain: Not on file  . Food insecurity:    Worry: Not on file    Inability: Not on file  . Transportation needs:    Medical: Not on file    Non-medical: Not on file  Tobacco Use  . Smoking status: Never Smoker  . Smokeless tobacco: Never Used  Substance and Sexual Activity  . Alcohol use: No  . Drug use: No  . Sexual activity: Yes  Lifestyle  . Physical activity:    Days per week: Not on file    Minutes per session: Not on file  . Stress: Not on file  Relationships  . Social connections:    Talks on phone: Not on file    Gets together: Not on file    Attends religious service: Not on file    Active member of club or organization: Not on file    Attends meetings of clubs or organizations: Not on file    Relationship status: Not on file  . Intimate partner violence:    Fear of current or ex partner: Not on file    Emotionally abused: Not on file    Physically abused: Not on file    Forced sexual activity: Not on file  Other Topics Concern  . Not on file  Social History Narrative  . Not on file     PHYSICAL EXAM   Vitals:   05/28/18 1413  BP: 140/84  Pulse: 92  Weight: 187 lb (84.8 kg)  Height: 5\' 1"  (1.549 m)    Not recorded      Body mass index is 35.33 kg/m.   Generalized: In no acute distress  Neck: Supple, no carotid bruits   Cardiac: Regular rate rhythm  Pulmonary: Clear to auscultation bilaterally  Musculoskeletal: No deformity  Neurological examination  Mentation: Alert oriented to time, place, history taking, and causual  conversation  Cranial nerve II-XII: Pupils were equal round reactive to light. Extraocular movements were full.  Left visual field cut.  Left lower face  weakness. Hearing was intact to finger rubbing bilaterally. Uvula tongue midline.  Head turning were normal and symmetric.Tongue protrusion into cheek strength was normal.  Motor: Dense spastic left hemiparesis, left hand thumb in position, proximal and distal strength is 1 out of 5, left lower extremity 1 out of 5, left knee extension, ankle plantarflexion, when she ambulate, she complains of painful left toe flexion, left ankle plantarflexion  Sensory: Intact to fine touch, pinprick, preserved vibratory sensation, and proprioception at toes.  Coordination: Normal finger to nose, heel-to-shin bilaterally there was no truncal ataxia  Gait: Spastic left hemi-circumferential gait, unsteady  Deep tendon reflexes: Left hyperreflexia   DIAGNOSTIC DATA (LABS, IMAGING, TESTING) - I reviewed patient records, labs, notes, testing and imaging myself where available.  Lab Results  Component Value Date   WBC 12.5 (H) 09/13/2017   HGB 11.0 (L) 09/13/2017   HCT 35.5 (L) 09/13/2017   MCV 79.2 09/13/2017   PLT 353 09/13/2017      Component Value Date/Time   NA 137 09/13/2017 0814   K 3.9 09/13/2017 0814   CL 100 (L) 09/13/2017 0814   CO2 28 09/13/2017 0814   GLUCOSE 112 (H) 09/13/2017 0814   BUN 9 09/13/2017 0814   CREATININE 0.76 09/13/2017 0814   CALCIUM 8.9 09/13/2017 0814   PROT 6.3 02/22/2013 0430   ALBUMIN 2.6 (L) 02/22/2013 0430   AST 21 02/22/2013 0430   ALT 23 02/22/2013 0430   ALKPHOS 93 02/22/2013 0430   BILITOT 0.1 (L) 02/22/2013 0430   GFRNONAA >60 09/13/2017 0814   GFRAA >60 09/13/2017 0814   Lab Results  Component Value Date   CHOL 133 01/28/2012   HDL 38 (L) 01/28/2012   LDLCALC 73 01/28/2012   TRIG 109 01/28/2012   CHOLHDL 3.5 01/28/2012   Lab Results  Component Value Date   HGBA1C 6.5 (H) 09/13/2017    ASSESSMENT AND PLAN  KADESHA VIRRUETA is a 56 y.o. female complains of  right hemisphere hemorrhagic stroke, at age 64, in 2003  complex partial seizure,   doing well with Keppra 500 mg twice a day,   Spastic left hemiparesis EMG guided guided Botox injection, used 500 units of Botox A    Left palmaris longus 50 Left flexor digitorum profundi 50 units Left flexor digitorum superficialis 50 units  Left flexor carpi ulnaris 50  unit Left pronator teres 50  units  Left teres major 50 units Left pectoralis major 100 units   Left brachialis 100 units   She tolerated the injection well, will return to clinic in 3 months for repeat injection, need 500 units of BOTOX A     Marcial Pacas, M.D. Ph.D.  [facility] [address] [place], [place] [address] [phone]

## 2018-07-03 ENCOUNTER — Other Ambulatory Visit: Payer: Self-pay | Admitting: Neurology

## 2018-07-17 ENCOUNTER — Telehealth: Payer: Self-pay | Admitting: Neurology

## 2018-07-17 NOTE — Telephone Encounter (Signed)
Botox letter regarding Specialty Pharmacy mailed to patient °

## 2018-08-21 ENCOUNTER — Ambulatory Visit (INDEPENDENT_AMBULATORY_CARE_PROVIDER_SITE_OTHER): Payer: Medicare Other | Admitting: Neurology

## 2018-08-21 ENCOUNTER — Encounter: Payer: Self-pay | Admitting: Neurology

## 2018-08-21 VITALS — BP 101/59 | HR 55 | Ht 61.0 in | Wt 191.5 lb

## 2018-08-21 DIAGNOSIS — I69354 Hemiplegia and hemiparesis following cerebral infarction affecting left non-dominant side: Secondary | ICD-10-CM

## 2018-08-21 NOTE — Progress Notes (Signed)
PATIENT: Jordan Rivas DOB: 09-18-1960  HISTORICAL  CASSANDRE OLEKSY is on 57 years old African American female, accompanied by her daughter for evaluation of seizure, left spastic hemiparesis  She had a history of right hemisphere hemorrhagic stroke due to brain aneurysm per patient at age 17, was residual severe left spastic hemiparesis, left visual field cut, profound gait difficulty,  She presented with her only seizure in Jan 26 2013, she woke up in the middle of the night using at home, while walking towards the bathroom, she fell, with witnessed seizure-like activity, tongue biting, woke up confused, at the hospital  MRI of the brain demonstrate extensive encephalomalacia involving right basal ganglion, perisylvian fissure region, no acute lesions, she has been treated with Keppra 500 mg twice a day, she has no recurrent seizure,  She continues to complain deep body aching pain at the left side, involving her left upper and lower extremity, specificity, difficulty straightening up her left finger, left leg spasm, she reported improvement of her left lower extremity spasm after receiving Botox injection in the past, but it was many years ago,  She also complains of couple years history of frequent headaches, getting worse almost daily basis, she has been taking frequent Excedrin migraines, left retro-orbital area severe pounding headache was associated light, noise sensitivity,  UPDATE July 1st 2015: Patient returned for EMG guided Botox injection for her spastic left upper, and the lower extremity, potential side effects explained, she wants the injection to emphasize on her left ankle plantarflexion, and left finger flexion  UPDATE Nov 26th 2015: She responded very well to her first EMG guided Botox injection in July first 2015, she received 400 units, she reported less spasticity of her left arm, left foot, ambulate better, less left leg pain, no significant side effect  noticed  UPDATE Feb 24th 2016: She responded to previous injection in November 2015, especially her left upper extremity, no significant side effect,  She has no recurrent seizure, taking Keppra 500 mg twice a day She has chronic insomnia, taking Ambien 5 mg 2 tablets every night  UPDATE February 24 2015: Last injection was in October 27 2014, she responded very well, she can loosen her left shoulder much better, no significant side effect  Update September 21 2015: She responded very well to previous EMG guided Botox injection, she can straight out her finger better, no significant side effect notice, no recurrent seizure taking Keppra 500 mg twice a day  Update December 22 2015: She responded very well to previous injection significant side effect noticed, she now complains left knee hyperextension left knee pain, finger flexion vomiting position, she has no recurrent seizure, now taking Keppra 500 mg twice a day,  Update April 24 2016: She had no recurrent seizure, taking Keppra 500 mg twice a day, previous Botox ingestion has helped relaxed her shoulder, wrist, she complains of left  knee pain, tends to hyperextend her knee  UPDATE Nov 29th 2017: She responded well to previous injection but continue have significant left shoulder tightness, left foot tightness, especially left toes crunch underneath while ambulating  Update December 26 2016: She responded very well to previous Botox injection but his 5 month out from previous injection in November 2017, she noticed significant tightness of left shoulder, left forearm in position, left ankle plantarflexion, she had no recurrent seizure, tizanidine as needed has been helpful  UPDATE Jul 18 2017: She is accompanied by her son at today's clinic visit last injection was  in April, no she has significant spasticity of left arm shoulder, painful with passive movement  UPDATE February 19 2018: She is doing well with previous injection  Update May 28, 2018: She did well with previous injection on February 19, 2018.  UPDATE Aug 21 2018: She responded very well to previous injection.  REVIEW OF SYSTEMS: Full 14 system review of systems performed and notable only for: as above. All rest review of systems were negative.  ALLERGIES: Allergies  Allergen Reactions  . Celebrex [Celecoxib] Swelling and Other (See Comments)    "lips; it was terrible"  . Baclofen Swelling  . Aspirin   . Zolpidem     HOME MEDICATIONS: Current Outpatient Medications on File Prior to Visit  Medication Sig Dispense Refill  . Alcohol Swabs (ALCOHOL PREP) 70 % PADS     . amitriptyline (ELAVIL) 100 MG tablet Take 100 mg by mouth at bedtime.   0  . AQUALANCE LANCETS 30G MISC     . aspirin 81 MG chewable tablet Chew 1 tablet (81 mg total) by mouth daily. 30 tablet 3  . atorvastatin (LIPITOR) 40 MG tablet Take 40 mg by mouth daily.     . Blood Glucose Calibration (ONETOUCH VERIO) SOLN     . botulinum toxin Type A (BOTOX) 100 units SOLR injection Inject 500 Units into the muscle every 3 (three) months.    Marland Kitchen CARAFATE 1 GM/10ML suspension   0  . Cholecalciferol (VITAMIN D) 1000 UNITS capsule Take 1,000 Units by mouth daily.    . DULoxetine (CYMBALTA) 60 MG capsule Take 60 mg by mouth daily.    Marland Kitchen esomeprazole (NEXIUM) 40 MG capsule Take 40 mg by mouth 2 (two) times daily.    Marland Kitchen ketoconazole (NIZORAL) 2 % cream Apply 1 application topically 2 (two) times daily as needed for irritation.    . levETIRAcetam (KEPPRA) 500 MG tablet TAKE 1 TABLET BY MOUTH 2 TIMES DAILY 180 tablet 3  . lidocaine (XYLOCAINE) 5 % ointment Apply 1 application topically 4 (four) times daily as needed for moderate pain.     . meloxicam (MOBIC) 15 MG tablet Take 15 mg by mouth daily.    . metFORMIN (GLUCOPHAGE) 500 MG tablet Take 500 mg by mouth daily.  1  . metoprolol succinate (TOPROL-XL) 25 MG 24 hr tablet Take 1 tablet (25 mg total) by mouth daily. 30 tablet 3  . naproxen (NAPROSYN) 500 MG  tablet TAKE 1 TABLET BY MOUTH TWICE A DAY WITH MEALS IF NEEDED 180 tablet 0  . ONETOUCH VERIO test strip     . oxybutynin (DITROPAN-XL) 10 MG 24 hr tablet Take 10 mg by mouth 2 (two) times daily.    Marland Kitchen oxyCODONE (ROXICODONE) 15 MG immediate release tablet Take 15 mg by mouth 4 (four) times daily as needed for pain.     . pregabalin (LYRICA) 75 MG capsule TAKE ONE CAPSULE BY MOUTH THREE TIMES DAILY 270 capsule 1  . tiZANidine (ZANAFLEX) 2 MG tablet Take 2 tablets four times daily as needed for muscle spasms. 720 tablet 1   No current facility-administered medications on file prior to visit.     PAST MEDICAL HISTORY: Past Medical History:  Diagnosis Date  . Chronic pain    "in this left arm and leg that don't move"  . CVA (cerebrovascular accident) ([place]) 1995   "left side paralyzed"  . Dental caries   . Depression   . Diabetes mellitus without complication ([place])   . GERD (gastroesophageal reflux  disease)   . Headache   . Hypercholesteremia   . Hypertension   . Insomnia   . Neuropathy   . Overactive bladder   . Peripheral vascular disease ([place])    left arm and leg; since stroke 1995  . Pneumonia   . Seizures ([place]) 01/27/12   "first one ever"    PAST SURGICAL HISTORY: Past Surgical History:  Procedure Laterality Date  . CEREBRAL ANEURYSM REPAIR  11/1993   "left my left side paralyzed"  . DENTAL RESTORATION/EXTRACTION WITH X-RAY N/A 09/13/2017   Procedure: DENTAL RESTORATION/EXTRACTION;  Surgeon: Diona Browner, DDS;  Location: [facility];  Service: Oral Surgery;  Laterality: N/A;  . TUBAL LIGATION  04/1984    FAMILY HISTORY: Family History  Problem Relation Age of Onset  . Dementia Mother   . Diabetes type II Mother   . Cancer Mother   . Diabetes Mother   . Heart attack Father     SOCIAL HISTORY:  Social History   Socioeconomic History  . Marital status: Divorced    Spouse name: Not on file  . Number of children: 2  . Years of education: Not on file  . Highest  education level: Not on file  Occupational History  . Not on file  Social Needs  . Financial resource strain: Not on file  . Food insecurity:    Worry: Not on file    Inability: Not on file  . Transportation needs:    Medical: Not on file    Non-medical: Not on file  Tobacco Use  . Smoking status: Never Smoker  . Smokeless tobacco: Never Used  Substance and Sexual Activity  . Alcohol use: No  . Drug use: No  . Sexual activity: Yes  Lifestyle  . Physical activity:    Days per week: Not on file    Minutes per session: Not on file  . Stress: Not on file  Relationships  . Social connections:    Talks on phone: Not on file    Gets together: Not on file    Attends religious service: Not on file    Active member of club or organization: Not on file    Attends meetings of clubs or organizations: Not on file    Relationship status: Not on file  . Intimate partner violence:    Fear of current or ex partner: Not on file    Emotionally abused: Not on file    Physically abused: Not on file    Forced sexual activity: Not on file  Other Topics Concern  . Not on file  Social History Narrative  . Not on file     PHYSICAL EXAM   Vitals:   08/21/18 1417  BP: (!) 101/59  Pulse: (!) 55  Weight: 191 lb 8 oz (86.9 kg)  Height: 5\' 1"  (1.549 m)    Not recorded      Body mass index is 36.18 kg/m.   Neurological examination  Mentation: Alert oriented to time, place, history taking, and causual conversation  Cranial nerve II-XII: Pupils were equal round reactive to light. Extraocular movements were full.  Left visual field cut.  Left lower face weakness. Hearing was intact to finger rubbing bilaterally. Uvula tongue midline.  Head turning were normal and symmetric.Tongue protrusion into cheek strength was normal.  Motor: Dense spastic left hemiparesis, left hand thumb in position, proximal and distal strength is 1 out of 5, left lower extremity 1 out of 5, left knee extension,  ankle plantarflexion,  when she ambulate, she complains of painful left toe flexion, left ankle plantarflexion  Sensory: Intact to fine touch, pinprick, preserved vibratory sensation, and proprioception at toes.  Coordination: Normal finger to nose, heel-to-shin bilaterally there was no truncal ataxia  Gait: Spastic left hemi-circumferential gait, unsteady  Deep tendon reflexes: Left hyperreflexia   DIAGNOSTIC DATA (LABS, IMAGING, TESTING) - I reviewed patient records, labs, notes, testing and imaging myself where available.  Lab Results  Component Value Date   WBC 12.5 (H) 09/13/2017   HGB 11.0 (L) 09/13/2017   HCT 35.5 (L) 09/13/2017   MCV 79.2 09/13/2017   PLT 353 09/13/2017      Component Value Date/Time   NA 137 09/13/2017 0814   K 3.9 09/13/2017 0814   CL 100 (L) 09/13/2017 0814   CO2 28 09/13/2017 0814   GLUCOSE 112 (H) 09/13/2017 0814   BUN 9 09/13/2017 0814   CREATININE 0.76 09/13/2017 0814   CALCIUM 8.9 09/13/2017 0814   PROT 6.3 02/22/2013 0430   ALBUMIN 2.6 (L) 02/22/2013 0430   AST 21 02/22/2013 0430   ALT 23 02/22/2013 0430   ALKPHOS 93 02/22/2013 0430   BILITOT 0.1 (L) 02/22/2013 0430   GFRNONAA >60 09/13/2017 0814   GFRAA >60 09/13/2017 0814   Lab Results  Component Value Date   CHOL 133 01/28/2012   HDL 38 (L) 01/28/2012   LDLCALC 73 01/28/2012   TRIG 109 01/28/2012   CHOLHDL 3.5 01/28/2012   Lab Results  Component Value Date   HGBA1C 6.5 (H) 09/13/2017   ASSESSMENT AND PLAN  KAHLEY LEIB is a 57 y.o. female complains of  right hemisphere hemorrhagic stroke, at age 89, in 2003  complex partial seizure,   doing well with Keppra 500 mg twice a day,   Spastic left hemiparesis EMG guided guided Botox injection, used 500 units of Botox A    Left palmaris longus 50 Left flexor digitorum profundi 50 units Left flexor digitorum superficialis 100 units Left lumbricas 25 units Left opponent 25  Left flexor carpi ulnaris 50  unit Left  pronator teres 50  units     Left pectoralis major 100 units   Left brachialis 100 units   She tolerated the injection well, will return to clinic in 3 months for repeat injection, need 500 units of BOTOX A     Marcial Pacas, M.D. Ph.D.  [facility] [address], [place], [place] [address] [phone]

## 2018-08-21 NOTE — Progress Notes (Signed)
**  Botox 100 units x 5 vials, NDC 8891-6945-03, Lot [id number], Exp 5.2022, specialty pharmacy.//mck,rn**

## 2018-09-15 ENCOUNTER — Other Ambulatory Visit: Payer: Self-pay

## 2018-09-15 NOTE — Telephone Encounter (Signed)
Patient called requesting a refill

## 2018-09-16 ENCOUNTER — Encounter: Payer: Self-pay | Admitting: *Deleted

## 2018-09-16 ENCOUNTER — Other Ambulatory Visit: Payer: Self-pay | Admitting: *Deleted

## 2018-09-16 NOTE — Telephone Encounter (Signed)
Pt has called to report that she has checked with pharmacy and has yet to be able to get her naproxen (NAPROSYN) 500 MG tablet

## 2018-10-12 ENCOUNTER — Other Ambulatory Visit: Payer: Self-pay | Admitting: Neurology

## 2018-11-02 ENCOUNTER — Other Ambulatory Visit: Payer: Self-pay | Admitting: Neurology

## 2018-11-12 ENCOUNTER — Telehealth: Payer: Self-pay | Admitting: Neurology

## 2018-11-12 NOTE — Telephone Encounter (Signed)
I called the patients pharmacy [facility] at [phone] to request a refill on the patients medication. I called and started PA request.

## 2018-11-13 NOTE — Telephone Encounter (Signed)
Patient's PA was denied. I have started appeal process.

## 2018-11-13 NOTE — Telephone Encounter (Signed)
I sent fax over to the patient's insurance.

## 2018-11-17 ENCOUNTER — Telehealth: Payer: Self-pay | Admitting: *Deleted

## 2018-11-17 NOTE — Telephone Encounter (Signed)
Patient called in to CX her Botox apt for this week until next week. I relayed to Patient Dr. Krista Blue does not have any open slots . Sharyn Lull please advise ? I will be glad to call patient. Thanks Hinton Dyer.

## 2018-11-17 NOTE — Telephone Encounter (Signed)
Spoke to patient - she has been rescheduled for 12/25/2018.

## 2018-11-20 ENCOUNTER — Ambulatory Visit: Payer: Self-pay | Admitting: Neurology

## 2018-11-26 ENCOUNTER — Ambulatory Visit: Payer: Self-pay | Admitting: Neurology

## 2018-12-04 ENCOUNTER — Telehealth: Payer: Self-pay | Admitting: Neurology

## 2018-12-04 NOTE — Telephone Encounter (Signed)
I called and spoke with patient regarding [id number] office policy. Patient has chosen to wait to receive injections and requested a call back in May. I have added her to our list.

## 2018-12-25 ENCOUNTER — Ambulatory Visit: Payer: Self-pay | Admitting: Neurology

## 2019-01-03 ENCOUNTER — Other Ambulatory Visit: Payer: Self-pay | Admitting: Neurology

## 2019-01-21 ENCOUNTER — Telehealth: Payer: Self-pay | Admitting: Neurology

## 2019-01-21 NOTE — Telephone Encounter (Signed)
Jordan Rivas form [facility] calling to set up delivery of Botox. Can speak to anyone at [phone]

## 2019-01-22 ENCOUNTER — Telehealth: Payer: Self-pay | Admitting: Neurology

## 2019-01-22 NOTE — Telephone Encounter (Signed)
Botox will be TBD 01/29/2019 . opt um  RX Fabio Asa .

## 2019-01-27 NOTE — Telephone Encounter (Signed)
Dana C scheduled delivery.

## 2019-01-27 NOTE — Telephone Encounter (Signed)
Noted, thank you. DW  °

## 2019-02-20 ENCOUNTER — Other Ambulatory Visit: Payer: Self-pay | Admitting: Neurology

## 2019-05-14 ENCOUNTER — Telehealth: Payer: Self-pay | Admitting: Neurology

## 2019-05-14 ENCOUNTER — Telehealth: Payer: Self-pay | Admitting: *Deleted

## 2019-05-14 ENCOUNTER — Ambulatory Visit: Payer: Medicare Other | Admitting: Neurology

## 2019-05-14 ENCOUNTER — Encounter: Payer: Self-pay | Admitting: Neurology

## 2019-05-14 NOTE — Telephone Encounter (Signed)
I called the patient back.  Reports new onset of neck pain that she feels is causing some headaches.  She is unsure of what caused the neck pain. She has never had this issue evaluated.  She is going to call her PCP for an initial evaluation to see if she will require a visit with a specialist and if so, which type of specialist would be best for problem (neuro vs ortho).

## 2019-05-14 NOTE — Telephone Encounter (Signed)
Pt is asking for a call from RN to discuss headaches in the back if her head near her neck

## 2019-05-14 NOTE — Telephone Encounter (Signed)
Canceled same day due to lack of transportation.

## 2019-06-17 ENCOUNTER — Encounter: Payer: Self-pay | Admitting: Neurology

## 2019-06-17 ENCOUNTER — Ambulatory Visit (INDEPENDENT_AMBULATORY_CARE_PROVIDER_SITE_OTHER): Payer: Medicare Other | Admitting: Neurology

## 2019-06-17 ENCOUNTER — Other Ambulatory Visit: Payer: Self-pay

## 2019-06-17 VITALS — BP 132/75 | HR 90 | Temp 97.9°F | Ht 61.0 in | Wt 184.0 lb

## 2019-06-17 DIAGNOSIS — I69354 Hemiplegia and hemiparesis following cerebral infarction affecting left non-dominant side: Secondary | ICD-10-CM

## 2019-06-17 NOTE — Progress Notes (Signed)
**  Botox 100 units x 5 vials, NDC [id number], Lot [id number], Exp 05/2021, specialty pharmacy.//mck,rn**

## 2019-06-17 NOTE — Progress Notes (Signed)
PATIENT: Jordan Rivas DOB: September 08, 1960  HISTORICAL  Jordan Rivas is on 58 years old African American female, accompanied by her daughter for evaluation of seizure, left spastic hemiparesis  She had a history of right hemisphere hemorrhagic stroke due to brain aneurysm per patient at age 72, was residual severe left spastic hemiparesis, left visual field cut, profound gait difficulty,  She presented with her only seizure in Jan 26 2013, she woke up in the middle of the night using at home, while walking towards the bathroom, she fell, with witnessed seizure-like activity, tongue biting, woke up confused, at the hospital  MRI of the brain demonstrate extensive encephalomalacia involving right basal ganglion, perisylvian fissure region, no acute lesions, she has been treated with Keppra 500 mg twice a day, she has no recurrent seizure,  She continues to complain deep body aching pain at the left side, involving her left upper and lower extremity, specificity, difficulty straightening up her left finger, left leg spasm, she reported improvement of her left lower extremity spasm after receiving Botox injection in the past, but it was many years ago,  She also complains of couple years history of frequent headaches, getting worse almost daily basis, she has been taking frequent Excedrin migraines, left retro-orbital area severe pounding headache was associated light, noise sensitivity,  UPDATE July 1st 2015: Patient returned for EMG guided Botox injection for her spastic left upper, and the lower extremity, potential side effects explained, she wants the injection to emphasize on her left ankle plantarflexion, and left finger flexion  UPDATE Nov 26th 2015: She responded very well to her first EMG guided Botox injection in July first 2015, she received 400 units, she reported less spasticity of her left arm, left foot, ambulate better, less left leg pain, no significant side effect noticed   UPDATE Feb 24th 2016: She responded to previous injection in November 2015, especially her left upper extremity, no significant side effect,  She has no recurrent seizure, taking Keppra 500 mg twice a day She has chronic insomnia, taking Ambien 5 mg 2 tablets every night  UPDATE February 24 2015: Last injection was in October 27 2014, she responded very well, she can loosen her left shoulder much better, no significant side effect  Update September 21 2015: She responded very well to previous EMG guided Botox injection, she can straight out her finger better, no significant side effect notice, no recurrent seizure taking Keppra 500 mg twice a day  Update December 22 2015: She responded very well to previous injection significant side effect noticed, she now complains left knee hyperextension left knee pain, finger flexion vomiting position, she has no recurrent seizure, now taking Keppra 500 mg twice a day,  Update April 24 2016: She had no recurrent seizure, taking Keppra 500 mg twice a day, previous Botox ingestion has helped relaxed her shoulder, wrist, she complains of left  knee pain, tends to hyperextend her knee  UPDATE Nov 29th 2017: She responded well to previous injection but continue have significant left shoulder tightness, left foot tightness, especially left toes crunch underneath while ambulating  Update December 26 2016: She responded very well to previous Botox injection but his 5 month out from previous injection in November 2017, she noticed significant tightness of left shoulder, left forearm in position, left ankle plantarflexion, she had no recurrent seizure, tizanidine as needed has been helpful  UPDATE Jul 18 2017: She is accompanied by her son at today's clinic visit last injection was  in April, no she has significant spasticity of left arm shoulder, painful with passive movement  UPDATE February 19 2018: She is doing well with previous injection  Update May 28, 2018:  She did well with previous injection on February 19, 2018.  UPDATE Aug 21 2018: She responded very well to previous injection.  UPDATE Jun 17 2019: She responded well to previous injection,  REVIEW OF SYSTEMS: Full 14 system review of systems performed and notable only for: as above. All rest review of systems were negative.  ALLERGIES: Allergies  Allergen Reactions  . Celebrex [Celecoxib] Swelling and Other (See Comments)    "lips; it was terrible"  . Baclofen Swelling  . Aspirin   . Zolpidem     HOME MEDICATIONS: Current Outpatient Medications on File Prior to Visit  Medication Sig Dispense Refill  . Alcohol Swabs (ALCOHOL PREP) 70 % PADS     . amitriptyline (ELAVIL) 100 MG tablet Take 100 mg by mouth at bedtime.   0  . AQUALANCE LANCETS 30G MISC     . aspirin 81 MG chewable tablet Chew 1 tablet (81 mg total) by mouth daily. 30 tablet 3  . atorvastatin (LIPITOR) 40 MG tablet Take 40 mg by mouth daily.     . Blood Glucose Calibration (ONETOUCH VERIO) SOLN     . botulinum toxin Type A (BOTOX) 100 units SOLR injection Inject 500 Units into the muscle every 3 (three) months.    Marland Kitchen CARAFATE 1 GM/10ML suspension   0  . Cholecalciferol (VITAMIN D) 1000 UNITS capsule Take 1,000 Units by mouth daily.    . DULoxetine (CYMBALTA) 60 MG capsule Take 60 mg by mouth daily.    Marland Kitchen esomeprazole (NEXIUM) 40 MG capsule Take 40 mg by mouth 2 (two) times daily.    Marland Kitchen ketoconazole (NIZORAL) 2 % cream Apply 1 application topically 2 (two) times daily as needed for irritation.    . levETIRAcetam (KEPPRA) 500 MG tablet TAKE 1 TABLET BY MOUTH 2 TIMES DAILY 180 tablet 3  . lidocaine (XYLOCAINE) 5 % ointment Apply 1 application topically 4 (four) times daily as needed for moderate pain.     . meloxicam (MOBIC) 15 MG tablet Take 15 mg by mouth daily.    . metFORMIN (GLUCOPHAGE) 500 MG tablet Take 500 mg by mouth daily.  1  . metoprolol succinate (TOPROL-XL) 25 MG 24 hr tablet Take 1 tablet (25 mg total) by  mouth daily. 30 tablet 3  . ONETOUCH VERIO test strip     . oxybutynin (DITROPAN-XL) 10 MG 24 hr tablet Take 10 mg by mouth 2 (two) times daily.    Marland Kitchen oxyCODONE (ROXICODONE) 15 MG immediate release tablet Take 15 mg by mouth 4 (four) times daily as needed for pain.     . pregabalin (LYRICA) 75 MG capsule TAKE 1 CAPSULE BY MOUTH THREE TIMES DAILY 270 capsule 1  . tiZANidine (ZANAFLEX) 2 MG tablet TAKE 2 TABLETS BY MOUTH 4 TIMES DAILY AS NEEDED FOR MUSCLE SPASMS 720 tablet 1   No current facility-administered medications on file prior to visit.     PAST MEDICAL HISTORY: Past Medical History:  Diagnosis Date  . Chronic pain    "in this left arm and leg that don't move"  . CVA (cerebrovascular accident) ([place]) 1995   "left side paralyzed"  . Dental caries   . Depression   . Diabetes mellitus without complication ([place])   . GERD (gastroesophageal reflux disease)   . Headache   . Hypercholesteremia   .  Hypertension   . Insomnia   . Neuropathy   . Overactive bladder   . Peripheral vascular disease ([place])    left arm and leg; since stroke 1995  . Pneumonia   . Seizures ([place]) 01/27/12   "first one ever"    PAST SURGICAL HISTORY: Past Surgical History:  Procedure Laterality Date  . CEREBRAL ANEURYSM REPAIR  11/1993   "left my left side paralyzed"  . DENTAL RESTORATION/EXTRACTION WITH X-RAY N/A 09/13/2017   Procedure: DENTAL RESTORATION/EXTRACTION;  Surgeon: Diona Browner, DDS;  Location: [facility];  Service: Oral Surgery;  Laterality: N/A;  . TUBAL LIGATION  04/1984    FAMILY HISTORY: Family History  Problem Relation Age of Onset  . Dementia Mother   . Diabetes type II Mother   . Cancer Mother   . Diabetes Mother   . Heart attack Father     SOCIAL HISTORY:  Social History   Socioeconomic History  . Marital status: Divorced    Spouse name: Not on file  . Number of children: 2  . Years of education: Not on file  . Highest education level: Not on file  Occupational History  .  Not on file  Social Needs  . Financial resource strain: Not on file  . Food insecurity    Worry: Not on file    Inability: Not on file  . Transportation needs    Medical: Not on file    Non-medical: Not on file  Tobacco Use  . Smoking status: Never Smoker  . Smokeless tobacco: Never Used  Substance and Sexual Activity  . Alcohol use: No  . Drug use: No  . Sexual activity: Yes  Lifestyle  . Physical activity    Days per week: Not on file    Minutes per session: Not on file  . Stress: Not on file  Relationships  . Social Herbalist on phone: Not on file    Gets together: Not on file    Attends religious service: Not on file    Active member of club or organization: Not on file    Attends meetings of clubs or organizations: Not on file    Relationship status: Not on file  . Intimate partner violence    Fear of current or ex partner: Not on file    Emotionally abused: Not on file    Physically abused: Not on file    Forced sexual activity: Not on file  Other Topics Concern  . Not on file  Social History Narrative  . Not on file     PHYSICAL EXAM   Vitals:   06/17/19 1433  BP: 132/75  Pulse: 90  Temp: 97.9 F (36.6 C)  Weight: 184 lb (83.5 kg)  Height: 5\' 1"  (1.549 m)    Not recorded      Body mass index is 34.77 kg/m.   Neurological examination   Motor: Dense spastic left hemiparesis, left hand thumb in position, proximal and distal strength is 1 out of 5, left lower extremity 1 out of 5, left knee extension, ankle plantarflexion, when she ambulate, she complains of painful left toe flexion, left ankle plantarflexion  DIAGNOSTIC DATA (LABS, IMAGING, TESTING) - I reviewed patient records, labs, notes, testing and imaging myself where available.  Lab Results  Component Value Date   WBC 12.5 (H) 09/13/2017   HGB 11.0 (L) 09/13/2017   HCT 35.5 (L) 09/13/2017   MCV 79.2 09/13/2017   PLT 353 09/13/2017  Component Value Date/Time   NA  137 09/13/2017 0814   K 3.9 09/13/2017 0814   CL 100 (L) 09/13/2017 0814   CO2 28 09/13/2017 0814   GLUCOSE 112 (H) 09/13/2017 0814   BUN 9 09/13/2017 0814   CREATININE 0.76 09/13/2017 0814   CALCIUM 8.9 09/13/2017 0814   PROT 6.3 02/22/2013 0430   ALBUMIN 2.6 (L) 02/22/2013 0430   AST 21 02/22/2013 0430   ALT 23 02/22/2013 0430   ALKPHOS 93 02/22/2013 0430   BILITOT 0.1 (L) 02/22/2013 0430   GFRNONAA >60 09/13/2017 0814   GFRAA >60 09/13/2017 0814   Lab Results  Component Value Date   CHOL 133 01/28/2012   HDL 38 (L) 01/28/2012   LDLCALC 73 01/28/2012   TRIG 109 01/28/2012   CHOLHDL 3.5 01/28/2012   Lab Results  Component Value Date   HGBA1C 6.5 (H) 09/13/2017   ASSESSMENT AND PLAN  DRAYAH BARCENA is a 58 y.o. female complains of  right hemisphere hemorrhagic stroke, at age 57, in 2003  complex partial seizure,   doing well with Keppra 500 mg twice a day,   Spastic left hemiparesis EMG guided guided Botox injection, used 500 units of Botox A    Left palmaris longus 50 Left flexor digitorum profundi 50 units Left flexor digitorum superficialis 100 units Left opponent 25  Left flexor carpi ulnaris 50  unit Left pronator teres 50  units     Left pectoralis major 50 units Left latissimus dorsi 75 units   Left brachialis 50 units   She tolerated the injection well, will return to clinic in 3 months for repeat injection, need 500 units of BOTOX A     Marcial Pacas, M.D. Ph.D.  [facility] [address], [place], [place] [address] [phone]

## 2019-06-20 ENCOUNTER — Other Ambulatory Visit: Payer: Self-pay | Admitting: Neurology

## 2019-06-23 ENCOUNTER — Encounter: Payer: Self-pay | Admitting: Neurology

## 2019-06-23 ENCOUNTER — Ambulatory Visit (INDEPENDENT_AMBULATORY_CARE_PROVIDER_SITE_OTHER): Payer: Medicare Other | Admitting: Neurology

## 2019-06-23 ENCOUNTER — Other Ambulatory Visit: Payer: Self-pay

## 2019-06-23 VITALS — BP 128/82 | HR 76 | Temp 98.0°F | Ht 61.0 in | Wt 184.0 lb

## 2019-06-23 DIAGNOSIS — G40909 Epilepsy, unspecified, not intractable, without status epilepticus: Secondary | ICD-10-CM

## 2019-06-23 DIAGNOSIS — I69354 Hemiplegia and hemiparesis following cerebral infarction affecting left non-dominant side: Secondary | ICD-10-CM

## 2019-06-23 MED ORDER — LEVETIRACETAM 750 MG PO TABS: 750.0000 mg | ORAL_TABLET | Freq: Two times a day (BID) | ORAL | 4 refills | Status: DC

## 2019-06-23 MED ORDER — LEVETIRACETAM 500 MG PO TABS: 500.0000 mg | ORAL_TABLET | Freq: Two times a day (BID) | ORAL | 4 refills | Status: DC

## 2019-06-23 NOTE — Progress Notes (Signed)
PATIENT: Jordan Rivas DOB: Apr 23, 1961  HISTORICAL  Jordan Rivas is on 58 years old African American female, accompanied by her daughter for evaluation of seizure, left spastic hemiparesis  She had a history of right hemisphere hemorrhagic stroke due to brain aneurysm per patient at age 80, was residual severe left spastic hemiparesis, left visual field cut, profound gait difficulty,  She presented with her only seizure in Jan 26 2013, she woke up in the middle of the night using at home, while walking towards the bathroom, she fell, with witnessed seizure-like activity, tongue biting, woke up confused, at the hospital  MRI of the brain demonstrate extensive encephalomalacia involving right basal ganglion, perisylvian fissure region, no acute lesions, she has been treated with Keppra 500 mg twice a day, she has no recurrent seizure,  She continues to complain deep body aching pain at the left side, involving her left upper and lower extremity, specificity, difficulty straightening up her left finger, left leg spasm, she reported improvement of her left lower extremity spasm after receiving Botox injection in the past, but it was many years ago,  She also complains of couple years history of frequent headaches, getting worse almost daily basis, she has been taking frequent Excedrin migraines, left retro-orbital area severe pounding headache was associated light, noise sensitivity,  She has been receiving EMG guided botulinum toxin injection for her spastic left hemiparesis every 3 months since July 2015.  She is referred back by her primary care today for evaluation of possible recurrent seizure, October 17, 18, she woke up, her body was 90 degree from her usual sleeping position, 1 night she fell out of the bed do not know why, she denies tongue biting, no incontinence, she has been compliant with her Keppra 500 mg twice daily.  We personally reviewed MRI of the brain in 2013, large  infarction within the right basal ganglion, insular, right frontoparietal region,  REVIEW OF SYSTEMS: Full 14 system review of systems performed and notable only for: as above. All rest review of systems were negative.  ALLERGIES: Allergies  Allergen Reactions  . Celebrex [Celecoxib] Swelling and Other (See Comments)    "lips; it was terrible"  . Baclofen Swelling  . Aspirin   . Zolpidem     HOME MEDICATIONS: Current Outpatient Medications on File Prior to Visit  Medication Sig Dispense Refill  . Alcohol Swabs (ALCOHOL PREP) 70 % PADS     . amitriptyline (ELAVIL) 100 MG tablet Take 100 mg by mouth at bedtime.   0  . AQUALANCE LANCETS 30G MISC     . aspirin 81 MG chewable tablet Chew 1 tablet (81 mg total) by mouth daily. 30 tablet 3  . atorvastatin (LIPITOR) 40 MG tablet Take 40 mg by mouth daily.     . Blood Glucose Calibration (ONETOUCH VERIO) SOLN     . botulinum toxin Type A (BOTOX) 100 units SOLR injection Inject 500 Units into the muscle every 3 (three) months.    Marland Kitchen CARAFATE 1 GM/10ML suspension   0  . Cholecalciferol (VITAMIN D) 1000 UNITS capsule Take 1,000 Units by mouth daily.    . DULoxetine (CYMBALTA) 60 MG capsule Take 60 mg by mouth daily.    Marland Kitchen esomeprazole (NEXIUM) 40 MG capsule Take 40 mg by mouth 2 (two) times daily.    Marland Kitchen ketoconazole (NIZORAL) 2 % cream Apply 1 application topically 2 (two) times daily as needed for irritation.    . levETIRAcetam (KEPPRA) 500 MG tablet  TAKE 1 TABLET BY MOUTH 2 TIMES DAILY 180 tablet 3  . lidocaine (XYLOCAINE) 5 % ointment Apply 1 application topically 4 (four) times daily as needed for moderate pain.     . meloxicam (MOBIC) 15 MG tablet Take 15 mg by mouth daily.    . metFORMIN (GLUCOPHAGE) 500 MG tablet Take 500 mg by mouth daily.  1  . metoprolol succinate (TOPROL-XL) 25 MG 24 hr tablet Take 1 tablet (25 mg total) by mouth daily. 30 tablet 3  . ONETOUCH VERIO test strip     . oxybutynin (DITROPAN-XL) 10 MG 24 hr tablet Take 10  mg by mouth 2 (two) times daily.    Marland Kitchen oxyCODONE (ROXICODONE) 15 MG immediate release tablet Take 15 mg by mouth 4 (four) times daily as needed for pain.     . pregabalin (LYRICA) 75 MG capsule TAKE 1 CAPSULE BY MOUTH THREE TIMES DAILY 270 capsule 1  . tiZANidine (ZANAFLEX) 2 MG tablet TAKE 2 TABLETS BY MOUTH 4 TIMES DAILY AS NEEDED FOR MUSCLE SPASMS 720 tablet 1   No current facility-administered medications on file prior to visit.     PAST MEDICAL HISTORY: Past Medical History:  Diagnosis Date  . Chronic pain    "in this left arm and leg that don't move"  . CVA (cerebrovascular accident) ([place]) 1995   "left side paralyzed"  . Dental caries   . Depression   . Diabetes mellitus without complication ([place])   . GERD (gastroesophageal reflux disease)   . Headache   . Hypercholesteremia   . Hypertension   . Insomnia   . Neuropathy   . Overactive bladder   . Peripheral vascular disease ([place])    left arm and leg; since stroke 1995  . Pneumonia   . Seizures ([place]) 01/27/12   "first one ever"    PAST SURGICAL HISTORY: Past Surgical History:  Procedure Laterality Date  . CEREBRAL ANEURYSM REPAIR  11/1993   "left my left side paralyzed"  . DENTAL RESTORATION/EXTRACTION WITH X-RAY N/A 09/13/2017   Procedure: DENTAL RESTORATION/EXTRACTION;  Surgeon: Diona Browner, DDS;  Location: [facility];  Service: Oral Surgery;  Laterality: N/A;  . TUBAL LIGATION  04/1984    FAMILY HISTORY: Family History  Problem Relation Age of Onset  . Dementia Mother   . Diabetes type II Mother   . Diabetes Mother   . Breast cancer Mother   . Heart attack Father     SOCIAL HISTORY:  Social History   Socioeconomic History  . Marital status: Divorced    Spouse name: Not on file  . Number of children: 2  . Years of education: 38  . Highest education level: High school graduate  Occupational History  . Occupation: Disabled  Social Needs  . Financial resource strain: Not on file  . Food insecurity     Worry: Not on file    Inability: Not on file  . Transportation needs    Medical: Not on file    Non-medical: Not on file  Tobacco Use  . Smoking status: Never Smoker  . Smokeless tobacco: Never Used  Substance and Sexual Activity  . Alcohol use: No  . Drug use: No  . Sexual activity: Yes  Lifestyle  . Physical activity    Days per week: Not on file    Minutes per session: Not on file  . Stress: Not on file  Relationships  . Social connections    Talks on phone: Not on file  Gets together: Not on file    Attends religious service: Not on file    Active member of club or organization: Not on file    Attends meetings of clubs or organizations: Not on file    Relationship status: Not on file  . Intimate partner violence    Fear of current or ex partner: Not on file    Emotionally abused: Not on file    Physically abused: Not on file    Forced sexual activity: Not on file  Other Topics Concern  . Not on file  Social History Narrative   Lives at home with her grandson.   Right-handed.   No daily caffeine use.     PHYSICAL EXAM   Vitals:   06/23/19 1033  BP: 128/82  Pulse: 76  Temp: 98 F (36.7 C)  Weight: 184 lb (83.5 kg)  Height: 5\' 1"  (1.549 m)    Not recorded      Body mass index is 34.77 kg/m.   Neurological examination   PHYSICAL EXAMNIATION:  Gen: NAD, conversant, well nourised, well groomed                     Cardiovascular: Regular rate rhythm, no peripheral edema, warm, nontender. Eyes: Conjunctivae clear without exudates or hemorrhage Neck: Supple, no carotid bruits. Pulmonary: Clear to auscultation bilaterally   NEUROLOGICAL EXAM:  MENTAL STATUS: Speech:    Speech is normal; fluent and spontaneous with normal comprehension.  Cognition:     Orientation to time, place and person     Normal recent and remote memory     Normal Attention span and concentration     Normal Language, naming, repeating,spontaneous speech     Fund of  knowledge   CRANIAL NERVES: CN II: Left hemivisual field deficit, pupils are round equal and briskly reactive to light. CN III, IV, VI: extraocular movement are normal. No ptosis. CN V: Facial sensation is intact to pinprick in all 3 divisions bilaterally. Corneal responses are intact.  CN VII: Face is symmetric with normal eye closure and smile. CN VIII: Hearing is normal to casual conversation CN IX, X: Palate elevates symmetrically. Phonation is normal. CN XI: Head turning and shoulder shrug are intact CN XII: Tongue is midline with normal movements and no atrophy.  MOTOR: Dense spastic left hemiparesis, left hand thumb in position, proximal and distal strength is 1 out of 5, left lower extremity 1 out of 5, left knee extension, ankle plantarflexion,     REFLEXES: Hyperreflexia on the left side   SENSORY: Intact to light touch,   COORDINATION:  There is no dysmetria on finger-to-nose and heel-knee-shin.    GAIT/STANCE: She needs push-up to get up from seated position, spastic left hemiparesis, dragging her left leg  Motor:  DIAGNOSTIC DATA (LABS, IMAGING, TESTING) - I reviewed patient records, labs, notes, testing and imaging myself where available.  Lab Results  Component Value Date   WBC 12.5 (H) 09/13/2017   HGB 11.0 (L) 09/13/2017   HCT 35.5 (L) 09/13/2017   MCV 79.2 09/13/2017   PLT 353 09/13/2017      Component Value Date/Time   NA 137 09/13/2017 0814   K 3.9 09/13/2017 0814   CL 100 (L) 09/13/2017 0814   CO2 28 09/13/2017 0814   GLUCOSE 112 (H) 09/13/2017 0814   BUN 9 09/13/2017 0814   CREATININE 0.76 09/13/2017 0814   CALCIUM 8.9 09/13/2017 0814   PROT 6.3 02/22/2013 0430   ALBUMIN 2.6 (  L) 02/22/2013 0430   AST 21 02/22/2013 0430   ALT 23 02/22/2013 0430   ALKPHOS 93 02/22/2013 0430   BILITOT 0.1 (L) 02/22/2013 0430   GFRNONAA >60 09/13/2017 0814   GFRAA >60 09/13/2017 0814   Lab Results  Component Value Date   CHOL 133 01/28/2012   HDL 38 (L)  01/28/2012   LDLCALC 73 01/28/2012   TRIG 109 01/28/2012   CHOLHDL 3.5 01/28/2012   Lab Results  Component Value Date   HGBA1C 6.5 (H) 09/13/2017   ASSESSMENT AND PLAN  ANNYA SEGNER is a 58 y.o. female complains of  right hemisphere hemorrhagic stroke, at age 61, in 2003 Spastic left hemiparesis Complex partial seizure  Possible recurrent seizure on October 17 and October 18  MRI of the brain in 2013 showed old infarction within the right basal ganglion, insula, right frontoparietal region  EEG, keppra level  Increase Keppra to 750 mg twice a day   Marcial Pacas, M.D. Ph.D.  [facility] [address] [place], [place] [address] [phone]

## 2019-06-25 ENCOUNTER — Telehealth: Payer: Self-pay | Admitting: *Deleted

## 2019-06-25 LAB — LEVETIRACETAM LEVEL: Levetiracetam Lvl: 33.5 ug/mL (ref 10.0–40.0)

## 2019-06-25 NOTE — Telephone Encounter (Signed)
-----   Message from Marcial Pacas, MD sent at 06/25/2019 10:50 AM EDT ----- Please call patient for normal Keppra level 33.5

## 2019-06-25 NOTE — Telephone Encounter (Signed)
I spoke to the patient and she verbalized understanding of the results. 

## 2019-07-04 ENCOUNTER — Other Ambulatory Visit: Payer: Self-pay | Admitting: Neurology

## 2019-07-08 ENCOUNTER — Telehealth: Payer: Self-pay | Admitting: Neurology

## 2019-07-08 ENCOUNTER — Other Ambulatory Visit: Payer: Self-pay | Admitting: *Deleted

## 2019-07-08 MED ORDER — TIZANIDINE HCL 2 MG PO TABS: ORAL_TABLET | ORAL | 1 refills | Status: DC

## 2019-07-08 NOTE — Telephone Encounter (Signed)
She was calling to inquire about her tizanidine refills.  She is aware a 90-day supply x 1 was sent to [facility] on 07/06/2019.  She will call and request they get the prescription ready for pick up.

## 2019-07-08 NOTE — Telephone Encounter (Signed)
Pt is asking for a call from RN to discuss why Dr Krista Blue will not prescribe her muscle relaxers

## 2019-07-09 ENCOUNTER — Other Ambulatory Visit: Payer: Medicare Other

## 2019-07-27 ENCOUNTER — Other Ambulatory Visit: Payer: Medicare Other

## 2019-08-06 ENCOUNTER — Other Ambulatory Visit: Payer: Self-pay

## 2019-08-06 ENCOUNTER — Ambulatory Visit (INDEPENDENT_AMBULATORY_CARE_PROVIDER_SITE_OTHER): Payer: Medicare Other | Admitting: Neurology

## 2019-08-06 DIAGNOSIS — I69354 Hemiplegia and hemiparesis following cerebral infarction affecting left non-dominant side: Secondary | ICD-10-CM

## 2019-08-06 DIAGNOSIS — G40909 Epilepsy, unspecified, not intractable, without status epilepticus: Secondary | ICD-10-CM | POA: Diagnosis not present

## 2019-08-13 NOTE — Procedures (Signed)
   HISTORY: 58 year old female, with history of stroke, with residual spastic left hemiparesis, seizure  TECHNIQUE:  This is a routine 16 channel EEG recording with one channel devoted to a limited EKG recording.  It was performed during wakefulness, drowsiness and asleep.  Hyperventilation and photic stimulation were performed as activating procedures.  There are minimum muscle and movement artifact noted.  Upon maximum arousal, posterior dominant waking rhythm consistent of dysrhythmic theta range activity, moderate background slowing, 5 to 6 Hz, reactive to eye opening and closure.  Hyperventilation produced mild/moderate buildup with higher amplitude and the slower activities noted.  Photic stimulation did not alter the tracing.  During EEG recording, patient developed drowsiness and no deeper stage of sleep was achieved During EEG recording, there was no epileptiform discharge noted.  EKG demonstrate sinus rhythm, with heart rate of 84 bpm.  CONCLUSION: This is an abnormal awake EEG.  There is electrodiagnostic evidence of moderate background slowing, consistent with moderate bihemisphere malfunction.  There is no evidence of epileptiform discharge.   Marcial Pacas, M.D. Ph.D.  [facility] [place], [place] [address] Phone: [phone] Fax:      [phone]

## 2019-08-19 ENCOUNTER — Telehealth: Payer: Self-pay | Admitting: Neurology

## 2019-08-19 DIAGNOSIS — G43709 Chronic migraine without aura, not intractable, without status migrainosus: Secondary | ICD-10-CM

## 2019-08-19 DIAGNOSIS — I69354 Hemiplegia and hemiparesis following cerebral infarction affecting left non-dominant side: Secondary | ICD-10-CM

## 2019-08-19 DIAGNOSIS — IMO0002 Reserved for concepts with insufficient information to code with codable children: Secondary | ICD-10-CM | POA: Insufficient documentation

## 2019-08-19 MED ORDER — ONDANSETRON HCL 4 MG PO TABS: ORAL_TABLET | ORAL | 11 refills | Status: DC

## 2019-08-19 MED ORDER — AIMOVIG 70 MG/ML ~~LOC~~ SOAJ: 70.0000 mg | SUBCUTANEOUS | 11 refills | Status: DC

## 2019-08-19 NOTE — Telephone Encounter (Signed)
Patient called wanting to know if she could be prescribed something for her headaches that her insurance would cover. Please follow up.

## 2019-08-19 NOTE — Addendum Note (Signed)
Addended by: Noberto Retort C on: 08/19/2019 11:29 AM   Modules accepted: Orders

## 2019-08-19 NOTE — Telephone Encounter (Signed)
Reports an increase in the frequency of headaches.  She has one nearly everyday that varies in the severity.  She will sometimes have nausea.  Excedrin Migraine works at times but she knows that is not a good medication long term.  Also, says the caffeine keeps her up.  She is asking for other options.

## 2019-08-19 NOTE — Telephone Encounter (Signed)
She has a history of stroke, seizure, on polypharmacy treatment already, she reported a history of allergic reaction to Celebrex, please check to see if she can take NSAIDs.  1, I will give her CGRP antagonism as preventive medications, Aimovig 70 mg every months 2.  Cambia mixed with muscle relaxant tizanidine, heating pad as needed for abortive treatment, if she has a lot of nausea, requiring Zofran 4 mg as needed.  3. If she could not take NSAIDs, will call in tramadol as needed.

## 2019-08-19 NOTE — Telephone Encounter (Addendum)
I spoke to the patient.  She would like to try the Aimovig and ondansetron offered.  Her daughter will be able to give her the monthly injections. NSAIDS tend to bother her stomach.  She has tizanidine at home.  She has oxycodone at home, if needed.

## 2019-08-20 ENCOUNTER — Ambulatory Visit: Payer: Self-pay | Admitting: Neurology

## 2019-08-26 NOTE — Telephone Encounter (Signed)
Pt has called re: her Zofran .  Pt states Dr Krista Blue called in the refill and the pharmacy told her that they gave it to her.  Pt states when she got home and opened the bag the medication was not there, please call

## 2019-08-26 NOTE — Telephone Encounter (Signed)
Call the patient back. Patient states that she had contacted the pharmacy and they told her she picked the medication up. I informed her that since I don't work at the pharmacy I am unable to address that concern as that would have to be taken up with them. Advised the patient that I can see where the script was sent and confirmed that pharmacy received it. But that is all that I can do. Patient verbalized understanding.

## 2019-09-17 ENCOUNTER — Ambulatory Visit: Payer: Self-pay | Admitting: Neurology

## 2019-10-14 ENCOUNTER — Other Ambulatory Visit: Payer: Self-pay

## 2019-10-14 ENCOUNTER — Ambulatory Visit (INDEPENDENT_AMBULATORY_CARE_PROVIDER_SITE_OTHER): Payer: Medicare Other | Admitting: Neurology

## 2019-10-14 ENCOUNTER — Encounter: Payer: Self-pay | Admitting: Neurology

## 2019-10-14 VITALS — BP 142/79 | HR 77 | Temp 97.3°F | Ht 61.0 in | Wt 190.5 lb

## 2019-10-14 DIAGNOSIS — I69354 Hemiplegia and hemiparesis following cerebral infarction affecting left non-dominant side: Secondary | ICD-10-CM

## 2019-10-14 DIAGNOSIS — G43709 Chronic migraine without aura, not intractable, without status migrainosus: Secondary | ICD-10-CM

## 2019-10-14 DIAGNOSIS — G40909 Epilepsy, unspecified, not intractable, without status epilepticus: Secondary | ICD-10-CM

## 2019-10-14 DIAGNOSIS — IMO0002 Reserved for concepts with insufficient information to code with codable children: Secondary | ICD-10-CM

## 2019-10-14 MED ORDER — ONDANSETRON HCL 4 MG PO TABS: ORAL_TABLET | ORAL | 11 refills | Status: DC

## 2019-10-14 NOTE — Progress Notes (Signed)
**  Botox 100 units x 5 vials, NDC [id number], Lot [id number], Exp 08/2021, specialty pharmacy.//mck,rn**

## 2019-10-14 NOTE — Progress Notes (Signed)
PATIENT: Jordan Rivas DOB: 1960-12-24  HISTORICAL  Jordan Rivas is on 59 years old African American female, accompanied by her daughter for evaluation of seizure, left spastic hemiparesis  She had a history of right hemisphere hemorrhagic stroke due to brain aneurysm per patient at age 52, was residual severe left spastic hemiparesis, left visual field cut, profound gait difficulty,  She presented with her only seizure on Jan 26 2013, she woke up in the middle of the night using bathroom, while walking towards the bathroom, she fell, with witnessed seizure-like activity, tongue biting, woke up confused, at the hospital  MRI of the brain demonstrate extensive encephalomalacia involving right basal ganglion, perisylvian fissure region, no acute lesions, she has been treated with Keppra 500 mg twice a day, she has no recurrent seizure,  She continues to complain deep body aching pain at the left side, involving her left upper and lower extremity, specificity, difficulty straightening up her left finger, left leg spasm, she reported improvement of her left lower extremity spasm after receiving Botox injection in the past, but it was many years ago,  She also complains of couple years history of frequent headaches, getting worse almost daily basis, she has been taking frequent Excedrin migraines, left retro-orbital area severe pounding headache was associated light, noise sensitivity,  She has been receiving EMG guided botulinum toxin injection for her spastic left hemiparesis every 3 months since July 2015.  She is referred back by her primary care today for evaluation of possible recurrent seizure, October 17, 18, she woke up, her body was 90 degree from her usual sleeping position, 1 night she fell out of the bed do not know why, she denies tongue biting, no incontinence, she has been compliant with her Keppra 500 mg twice daily.  We personally reviewed MRI of the brain in 2013, large  infarction within the right basal ganglion, insular, right frontoparietal region,  UPDATE Oct 14 2019: She had no recurrent seizure, responded very well to previous, electrical stimulation guided Botox injection for spastic left upper extremity, she can move her shoulder, open up her finger much better  REVIEW OF SYSTEMS: Full 14 system review of systems performed and notable only for: as above. All rest review of systems were negative.  ALLERGIES: Allergies  Allergen Reactions  . Celebrex [Celecoxib] Swelling and Other (See Comments)    "lips; it was terrible"  . Baclofen Swelling  . Aspirin   . Zolpidem     HOME MEDICATIONS: Current Outpatient Medications on File Prior to Visit  Medication Sig Dispense Refill  . Alcohol Swabs (ALCOHOL PREP) 70 % PADS     . amitriptyline (ELAVIL) 100 MG tablet Take 100 mg by mouth at bedtime.   0  . AQUALANCE LANCETS 30G MISC     . aspirin 81 MG chewable tablet Chew 1 tablet (81 mg total) by mouth daily. 30 tablet 3  . atorvastatin (LIPITOR) 40 MG tablet Take 40 mg by mouth daily.     . Blood Glucose Calibration (ONETOUCH VERIO) SOLN     . botulinum toxin Type A (BOTOX) 100 units SOLR injection Inject 500 Units into the muscle every 3 (three) months.    Marland Kitchen CARAFATE 1 GM/10ML suspension   0  . Cholecalciferol (VITAMIN D) 1000 UNITS capsule Take 1,000 Units by mouth daily.    . DULoxetine (CYMBALTA) 60 MG capsule Take 60 mg by mouth daily.    Jordan Rivas (AIMOVIG) 70 MG/ML SOAJ Inject 70 mg into  the skin every 30 (thirty) days. 1 pen 11  . esomeprazole (NEXIUM) 40 MG capsule Take 40 mg by mouth 2 (two) times daily.    Marland Kitchen ketoconazole (NIZORAL) 2 % cream Apply 1 application topically 2 (two) times daily as needed for irritation.    . levETIRAcetam (KEPPRA) 750 MG tablet Take 1 tablet (750 mg total) by mouth 2 (two) times daily. 180 tablet 4  . lidocaine (XYLOCAINE) 5 % ointment Apply 1 application topically 4 (four) times daily as needed for  moderate pain.     . meloxicam (MOBIC) 15 MG tablet Take 15 mg by mouth daily.    . metFORMIN (GLUCOPHAGE) 500 MG tablet Take 500 mg by mouth daily.  1  . metoprolol succinate (TOPROL-XL) 25 MG 24 hr tablet Take 1 tablet (25 mg total) by mouth daily. 30 tablet 3  . ondansetron (ZOFRAN) 4 MG tablet Take one tab every 8 hours prn for nausea.  #20 per month. 20 tablet 11  . ONETOUCH VERIO test strip     . oxybutynin (DITROPAN-XL) 10 MG 24 hr tablet Take 10 mg by mouth 2 (two) times daily.    Marland Kitchen oxyCODONE (ROXICODONE) 15 MG immediate release tablet Take 15 mg by mouth 4 (four) times daily as needed for pain.     . pregabalin (LYRICA) 75 MG capsule TAKE 1 CAPSULE BY MOUTH THREE TIMES DAILY 270 capsule 1  . tiZANidine (ZANAFLEX) 2 MG tablet TAKE 2 TABLETS BY MOUTH FOUR TIMES DAILY AS NEEDED FOR MUSCLE SPASMS 720 tablet 1   No current facility-administered medications on file prior to visit.    PAST MEDICAL HISTORY: Past Medical History:  Diagnosis Date  . Chronic pain    "in this left arm and leg that don't move"  . CVA (cerebrovascular accident) ([place]) 1995   "left side paralyzed"  . Dental caries   . Depression   . Diabetes mellitus without complication ([place])   . GERD (gastroesophageal reflux disease)   . Headache   . Hypercholesteremia   . Hypertension   . Insomnia   . Neuropathy   . Overactive bladder   . Peripheral vascular disease ([place])    left arm and leg; since stroke 1995  . Pneumonia   . Seizures ([place]) 01/27/12   "first one ever"    PAST SURGICAL HISTORY: Past Surgical History:  Procedure Laterality Date  . CEREBRAL ANEURYSM REPAIR  11/1993   "left my left side paralyzed"  . DENTAL RESTORATION/EXTRACTION WITH X-RAY N/A 09/13/2017   Procedure: DENTAL RESTORATION/EXTRACTION;  Surgeon: Diona Browner, DDS;  Location: [facility];  Service: Oral Surgery;  Laterality: N/A;  . TUBAL LIGATION  04/1984    FAMILY HISTORY: Family History  Problem Relation Age of Onset  . Dementia  Mother   . Diabetes type II Mother   . Diabetes Mother   . Breast cancer Mother   . Heart attack Father     SOCIAL HISTORY:  Social History   Socioeconomic History  . Marital status: Divorced    Spouse name: Not on file  . Number of children: 2  . Years of education: 31  . Highest education level: High school graduate  Occupational History  . Occupation: Disabled  Tobacco Use  . Smoking status: Never Smoker  . Smokeless tobacco: Never Used  Substance and Sexual Activity  . Alcohol use: No  . Drug use: No  . Sexual activity: Yes  Other Topics Concern  . Not on file  Social History Narrative  Lives at home with her grandson.   Right-handed.   No daily caffeine use.   Social Determinants of Health   Financial Resource Strain:   . Difficulty of Paying Living Expenses: Not on file  Food Insecurity:   . Worried About Charity fundraiser in the Last Year: Not on file  . Ran Out of Food in the Last Year: Not on file  Transportation Needs:   . Lack of Transportation (Medical): Not on file  . Lack of Transportation (Non-Medical): Not on file  Physical Activity:   . Days of Exercise per Week: Not on file  . Minutes of Exercise per Session: Not on file  Stress:   . Feeling of Stress : Not on file  Social Connections:   . Frequency of Communication with Friends and Family: Not on file  . Frequency of Social Gatherings with Friends and Family: Not on file  . Attends Religious Services: Not on file  . Active Member of Clubs or Organizations: Not on file  . Attends Archivist Meetings: Not on file  . Marital Status: Not on file  Intimate Partner Violence:   . Fear of Current or Ex-Partner: Not on file  . Emotionally Abused: Not on file  . Physically Abused: Not on file  . Sexually Abused: Not on file     PHYSICAL EXAM   Vitals:   10/14/19 1409  BP: (!) 142/79  Pulse: 77  Temp: (!) 97.3 F (36.3 C)  Weight: 190 lb 8 oz (86.4 kg)  Height: 5\' 1"  (1.549  m)    Not recorded      Body mass index is 35.99 kg/m.   Neurological examination   PHYSICAL EXAMNIATION:  Spastic left hemiparesis, left hand thumb in position, proximal and distal strength is 1 out of 5, left lower extremity 1 out of 5, left knee extension, ankle plantarflexion, she has noticeable left shoulder pain with passive movement,    She needs push-up to get up from seated position, spastic left hemiparesis, dragging her left leg  ASSESSMENT AND PLAN  LEANI ELSTAD is a 59 y.o. female complains of  right hemisphere hemorrhagic stroke, at age 30, in 2003 Spastic left hemiparesis  MRI of the brain in 2013 showed old infarction within the right basal ganglion, insula, right frontoparietal region  Electrical stimulation guided Botox injection for spastic left upper extremity, we used Botox a 500 units  Left brachialis 100 units Left pronator teres 25 units Left flexor digitorum profundus 50 units Left flexor digitorum superficialis 50 units Left palmaris longus 25 units Left flexor carpi ulnaris 50 units  Left pectoralis major 100 units Left latissimus dorsi 100 units   Marcial Pacas, M.D. Ph.D.  [facility] [address] [place], [place] [address] [phone]

## 2019-11-23 ENCOUNTER — Other Ambulatory Visit: Payer: Self-pay | Admitting: *Deleted

## 2019-11-23 MED ORDER — PREGABALIN 75 MG PO CAPS: ORAL_CAPSULE | ORAL | 1 refills | Status: DC

## 2019-12-30 ENCOUNTER — Telehealth: Payer: Self-pay | Admitting: *Deleted

## 2019-12-30 MED ORDER — BOTOX 100 UNITS IJ SOLR: 500.0000 [IU] | INTRAMUSCULAR | 3 refills | Status: AC

## 2019-12-30 NOTE — Telephone Encounter (Signed)
Rx sent to requested pharmacy

## 2019-12-30 NOTE — Telephone Encounter (Signed)
Please send Botox prescription to [facility]. Patient has a Botox appointment on 01/13/2020.  I called [employer] and spoke to [place].  She states that [id number] and [phone] are valid and billable and do not require PA.  Ref# for this call is [id number].  I called [facility] to check the status of the prescription and they states the prescription on file has expired.

## 2020-01-04 NOTE — Telephone Encounter (Signed)
I called [facility] [phone] to check status of prescription and try to schedule delivery.  They state that patient is not eligible to get her prescription with them.  She will need to be B/B.

## 2020-01-04 NOTE — Telephone Encounter (Signed)
I called [facility] [phone] again because it looks like patient has always obtained her prescription through them.  I spoke to May who states she is eligible but a PA is required through [place] .  She transferred me to [phone].  I spoke to [place] and then the phone was disconnected.  I called back and spoke to [place]  to initiate PA. He states a form needs to be filled out and faxed back. Form completed and faxed back with instructions to please expedite.

## 2020-01-06 NOTE — Telephone Encounter (Signed)
I called [facility] to check status of the PA request sent via fax on 01/04/2020.  I spoke to [place]. She states she does not see that they received it.  I told her I have a fax confirmation. She transferred me to the PA dept to try to initiate over the phone.  I spoke to [facility] who took information over the phone and we were able to obtain the approval.  PA# is [id number] Valid from 01/06/2020-04/07/2020.  I then called [facility] again and spoke to [facility].  She sees the approval but there is a hold on the account.  She will have to speak to a pharmacist to take the hold off.  Once this is taken care of she will call me back to schedule.

## 2020-01-07 NOTE — Telephone Encounter (Signed)
I called [facility] [phone] and spoke to [place].  She transferred me to the pharmacy department, Magda Paganini, who states she will have to email a pharmacist who can verify a [place] prescription.  I let her know patient is scheduled on 01/13/2020.  She verbalized understanding and states she will be in touch with [place] today to schedule delivery.

## 2020-01-07 NOTE — Telephone Encounter (Signed)
Toye with [facility] called this morning to state the hold was taken off the prescription and delivery was scheduled for 01/12/2020.

## 2020-01-13 ENCOUNTER — Other Ambulatory Visit: Payer: Self-pay

## 2020-01-13 ENCOUNTER — Ambulatory Visit (INDEPENDENT_AMBULATORY_CARE_PROVIDER_SITE_OTHER): Payer: Medicare Other | Admitting: Neurology

## 2020-01-13 ENCOUNTER — Encounter: Payer: Self-pay | Admitting: Neurology

## 2020-01-13 VITALS — BP 152/90 | Temp 97.6°F

## 2020-01-13 DIAGNOSIS — I69354 Hemiplegia and hemiparesis following cerebral infarction affecting left non-dominant side: Secondary | ICD-10-CM

## 2020-01-13 DIAGNOSIS — IMO0002 Reserved for concepts with insufficient information to code with codable children: Secondary | ICD-10-CM

## 2020-01-13 NOTE — Progress Notes (Signed)
PATIENT: Jordan Rivas DOB: 09-20-1960  HISTORICAL  Jordan Rivas is on 59 years old African American female, accompanied by her daughter for evaluation of seizure, left spastic hemiparesis  She had a history of right hemisphere hemorrhagic stroke due to brain aneurysm per patient at age 23, was residual severe left spastic hemiparesis, left visual field cut, profound gait difficulty,  She presented with her only seizure on Jan 26 2013, she woke up in the middle of the night using bathroom, while walking towards the bathroom, she fell, with witnessed seizure-like activity, tongue biting, woke up confused, at the hospital  MRI of the brain demonstrate extensive encephalomalacia involving right basal ganglion, perisylvian fissure region, no acute lesions, she has been treated with Keppra 500 mg twice a day, she has no recurrent seizure,  She continues to complain deep body aching pain at the left side, involving her left upper and lower extremity, specificity, difficulty straightening up her left finger, left leg spasm, she reported improvement of her left lower extremity spasm after receiving Botox injection in the past, but it was many years ago,  She also complains of couple years history of frequent headaches, getting worse almost daily basis, she has been taking frequent Excedrin migraines, left retro-orbital area severe pounding headache was associated light, noise sensitivity,  She has been receiving EMG guided botulinum toxin injection for her spastic left hemiparesis every 3 months since July 2015.  She is referred back by her primary care today for evaluation of possible recurrent seizure, October 17, 18, she woke up, her body was 90 degree from her usual sleeping position, 1 night she fell out of the bed do not know why, she denies tongue biting, no incontinence, she has been compliant with her Keppra 500 mg twice daily.  We personally reviewed MRI of the brain in 2013, large  infarction within the right basal ganglion, insular, right frontoparietal region,  UPDATE Oct 14 2019: She had no recurrent seizure, responded very well to previous, electrical stimulation guided Botox injection for spastic left upper extremity, she can move her shoulder, open up her finger much better  REVIEW OF SYSTEMS: Full 14 system review of systems performed and notable only for: as above. All rest review of systems were negative.  ALLERGIES: Allergies  Allergen Reactions  . Celebrex [Celecoxib] Swelling and Other (See Comments)    "lips; it was terrible"  . Baclofen Swelling  . Aspirin   . Zolpidem     HOME MEDICATIONS: Current Outpatient Medications on File Prior to Visit  Medication Sig Dispense Refill  . Alcohol Swabs (ALCOHOL PREP) 70 % PADS     . amitriptyline (ELAVIL) 100 MG tablet Take 100 mg by mouth at bedtime.   0  . AQUALANCE LANCETS 30G MISC     . aspirin 81 MG chewable tablet Chew 1 tablet (81 mg total) by mouth daily. 30 tablet 3  . atorvastatin (LIPITOR) 40 MG tablet Take 40 mg by mouth daily.     . Blood Glucose Calibration (ONETOUCH VERIO) SOLN     . botulinum toxin Type A (BOTOX) 100 units SOLR injection Inject 500 Units into the muscle every 3 (three) months. 5 each 3  . CARAFATE 1 GM/10ML suspension   0  . Cholecalciferol (VITAMIN D) 1000 UNITS capsule Take 1,000 Units by mouth daily.    . DULoxetine (CYMBALTA) 60 MG capsule Take 60 mg by mouth daily.    Eduard Roux (AIMOVIG) 70 MG/ML SOAJ Inject 70 mg  into the skin every 30 (thirty) days. 1 pen 11  . esomeprazole (NEXIUM) 40 MG capsule Take 40 mg by mouth 2 (two) times daily.    Marland Kitchen ketoconazole (NIZORAL) 2 % cream Apply 1 application topically 2 (two) times daily as needed for irritation.    . levETIRAcetam (KEPPRA) 750 MG tablet Take 1 tablet (750 mg total) by mouth 2 (two) times daily. 180 tablet 4  . lidocaine (XYLOCAINE) 5 % ointment Apply 1 application topically 4 (four) times daily as needed for  moderate pain.     . meloxicam (MOBIC) 15 MG tablet Take 15 mg by mouth daily.    . metFORMIN (GLUCOPHAGE) 500 MG tablet Take 500 mg by mouth daily.  1  . metoprolol succinate (TOPROL-XL) 25 MG 24 hr tablet Take 1 tablet (25 mg total) by mouth daily. 30 tablet 3  . ondansetron (ZOFRAN) 4 MG tablet Take one tab every 8 hours prn for nausea.  #20 per month. 20 tablet 11  . ONETOUCH VERIO test strip     . oxybutynin (DITROPAN-XL) 10 MG 24 hr tablet Take 10 mg by mouth 2 (two) times daily.    Marland Kitchen oxyCODONE (ROXICODONE) 15 MG immediate release tablet Take 15 mg by mouth 4 (four) times daily as needed for pain.     . pregabalin (LYRICA) 75 MG capsule TAKE 1 CAPSULE BY MOUTH THREE TIMES DAILY 270 capsule 1  . tiZANidine (ZANAFLEX) 2 MG tablet TAKE 2 TABLETS BY MOUTH FOUR TIMES DAILY AS NEEDED FOR MUSCLE SPASMS 720 tablet 1   No current facility-administered medications on file prior to visit.    PAST MEDICAL HISTORY: Past Medical History:  Diagnosis Date  . Chronic pain    "in this left arm and leg that don't move"  . CVA (cerebrovascular accident) ([place]) 1995   "left side paralyzed"  . Dental caries   . Depression   . Diabetes mellitus without complication ([place])   . GERD (gastroesophageal reflux disease)   . Headache   . Hypercholesteremia   . Hypertension   . Insomnia   . Neuropathy   . Overactive bladder   . Peripheral vascular disease ([place])    left arm and leg; since stroke 1995  . Pneumonia   . Seizures ([place]) 01/27/12   "first one ever"    PAST SURGICAL HISTORY: Past Surgical History:  Procedure Laterality Date  . CEREBRAL ANEURYSM REPAIR  11/1993   "left my left side paralyzed"  . DENTAL RESTORATION/EXTRACTION WITH X-RAY N/A 09/13/2017   Procedure: DENTAL RESTORATION/EXTRACTION;  Surgeon: Diona Browner, DDS;  Location: [facility];  Service: Oral Surgery;  Laterality: N/A;  . TUBAL LIGATION  04/1984    FAMILY HISTORY: Family History  Problem Relation Age of Onset  . Dementia  Mother   . Diabetes type II Mother   . Diabetes Mother   . Breast cancer Mother   . Heart attack Father     SOCIAL HISTORY:  Social History   Socioeconomic History  . Marital status: Divorced    Spouse name: Not on file  . Number of children: 2  . Years of education: 69  . Highest education level: High school graduate  Occupational History  . Occupation: Disabled  Tobacco Use  . Smoking status: Never Smoker  . Smokeless tobacco: Never Used  Substance and Sexual Activity  . Alcohol use: No  . Drug use: No  . Sexual activity: Yes  Other Topics Concern  . Not on file  Social History Narrative  Lives at home with her grandson.   Right-handed.   No daily caffeine use.   Social Determinants of Health   Financial Resource Strain:   . Difficulty of Paying Living Expenses:   Food Insecurity:   . Worried About Charity fundraiser in the Last Year:   . Arboriculturist in the Last Year:   Transportation Needs:   . Film/video editor (Medical):   Marland Kitchen Lack of Transportation (Non-Medical):   Physical Activity:   . Days of Exercise per Week:   . Minutes of Exercise per Session:   Stress:   . Feeling of Stress :   Social Connections:   . Frequency of Communication with Friends and Family:   . Frequency of Social Gatherings with Friends and Family:   . Attends Religious Services:   . Active Member of Clubs or Organizations:   . Attends Archivist Meetings:   Marland Kitchen Marital Status:   Intimate Partner Violence:   . Fear of Current or Ex-Partner:   . Emotionally Abused:   Marland Kitchen Physically Abused:   . Sexually Abused:      PHYSICAL EXAM   There were no vitals filed for this visit.  Not recorded      There is no height or weight on file to calculate BMI.   Neurological examination   PHYSICAL EXAMNIATION:  Spastic left hemiparesis, left hand thumb in position, proximal and distal strength is 1 out of 5, left lower extremity 1 out of 5, tightness of left  shoulder, limited range of motion with passive stretch, fairly normal range of motion of left elbow,  Left knee extension, ankle plantarflexion, she has noticeable left shoulder pain with passive movement,    She needs push-up to get up from seated position, spastic left hemiparesis, dragging her left leg  ASSESSMENT AND PLAN  Jordan Rivas is a 59 y.o. female complains of  right hemisphere hemorrhagic stroke, at age 50, in 2003 Spastic left hemiparesis  MRI of the brain in 2013 showed old infarction within the right basal ganglion, insula, right frontoparietal region  Electrical stimulation guided Botox injection for spastic left upper extremity, we used Botox A 500 units   Left pronator teres 50 units Left flexor digitorum profundus 50 units Left flexor digitorum superficialis 50 units Left palmaris longus 25 units Left opponents 25 units   Left pectoralis major 150 units Left latissimus dorsi 150 units   Marcial Pacas, M.D. Ph.D.  [facility] [address], [place], [place] [address] [phone]

## 2020-01-13 NOTE — Progress Notes (Signed)
Botox- 100 units x 2 vials Lot: [id number] Expiration: 06/2022 NDC: [id number]    Specialty Pharm[facility]

## 2020-03-20 ENCOUNTER — Other Ambulatory Visit: Payer: Self-pay | Admitting: Neurology

## 2020-03-27 ENCOUNTER — Other Ambulatory Visit: Payer: Self-pay

## 2020-03-27 DIAGNOSIS — E1140 Type 2 diabetes mellitus with diabetic neuropathy, unspecified: Secondary | ICD-10-CM | POA: Diagnosis present

## 2020-03-27 DIAGNOSIS — A419 Sepsis, unspecified organism: Principal | ICD-10-CM | POA: Diagnosis present

## 2020-03-27 DIAGNOSIS — G9341 Metabolic encephalopathy: Secondary | ICD-10-CM | POA: Diagnosis not present

## 2020-03-27 DIAGNOSIS — E781 Pure hyperglyceridemia: Secondary | ICD-10-CM | POA: Diagnosis present

## 2020-03-27 DIAGNOSIS — K5900 Constipation, unspecified: Secondary | ICD-10-CM | POA: Diagnosis present

## 2020-03-27 DIAGNOSIS — J189 Pneumonia, unspecified organism: Secondary | ICD-10-CM | POA: Diagnosis present

## 2020-03-27 DIAGNOSIS — K449 Diaphragmatic hernia without obstruction or gangrene: Secondary | ICD-10-CM | POA: Diagnosis present

## 2020-03-27 DIAGNOSIS — G40909 Epilepsy, unspecified, not intractable, without status epilepticus: Secondary | ICD-10-CM | POA: Diagnosis present

## 2020-03-27 DIAGNOSIS — M1990 Unspecified osteoarthritis, unspecified site: Secondary | ICD-10-CM | POA: Diagnosis present

## 2020-03-27 DIAGNOSIS — G894 Chronic pain syndrome: Secondary | ICD-10-CM | POA: Diagnosis present

## 2020-03-27 DIAGNOSIS — K219 Gastro-esophageal reflux disease without esophagitis: Secondary | ICD-10-CM | POA: Diagnosis present

## 2020-03-27 DIAGNOSIS — Z803 Family history of malignant neoplasm of breast: Secondary | ICD-10-CM

## 2020-03-27 DIAGNOSIS — E1151 Type 2 diabetes mellitus with diabetic peripheral angiopathy without gangrene: Secondary | ICD-10-CM | POA: Diagnosis present

## 2020-03-27 DIAGNOSIS — E7800 Pure hypercholesterolemia, unspecified: Secondary | ICD-10-CM | POA: Diagnosis present

## 2020-03-27 DIAGNOSIS — E872 Acidosis: Secondary | ICD-10-CM | POA: Diagnosis not present

## 2020-03-27 DIAGNOSIS — E669 Obesity, unspecified: Secondary | ICD-10-CM | POA: Diagnosis present

## 2020-03-27 DIAGNOSIS — Z7984 Long term (current) use of oral hypoglycemic drugs: Secondary | ICD-10-CM

## 2020-03-27 DIAGNOSIS — Z6837 Body mass index (BMI) 37.0-37.9, adult: Secondary | ICD-10-CM

## 2020-03-27 DIAGNOSIS — Z79899 Other long term (current) drug therapy: Secondary | ICD-10-CM

## 2020-03-27 DIAGNOSIS — Z833 Family history of diabetes mellitus: Secondary | ICD-10-CM

## 2020-03-27 DIAGNOSIS — I10 Essential (primary) hypertension: Secondary | ICD-10-CM | POA: Diagnosis present

## 2020-03-27 DIAGNOSIS — G43909 Migraine, unspecified, not intractable, without status migrainosus: Secondary | ICD-10-CM | POA: Diagnosis present

## 2020-03-27 DIAGNOSIS — E785 Hyperlipidemia, unspecified: Secondary | ICD-10-CM | POA: Diagnosis present

## 2020-03-27 DIAGNOSIS — I69359 Hemiplegia and hemiparesis following cerebral infarction affecting unspecified side: Secondary | ICD-10-CM

## 2020-03-27 DIAGNOSIS — E876 Hypokalemia: Secondary | ICD-10-CM | POA: Diagnosis not present

## 2020-03-27 DIAGNOSIS — Z8249 Family history of ischemic heart disease and other diseases of the circulatory system: Secondary | ICD-10-CM

## 2020-03-27 DIAGNOSIS — Z818 Family history of other mental and behavioral disorders: Secondary | ICD-10-CM

## 2020-03-27 DIAGNOSIS — R509 Fever, unspecified: Secondary | ICD-10-CM | POA: Diagnosis present

## 2020-03-27 DIAGNOSIS — D62 Acute posthemorrhagic anemia: Secondary | ICD-10-CM | POA: Diagnosis present

## 2020-03-27 DIAGNOSIS — J9601 Acute respiratory failure with hypoxia: Secondary | ICD-10-CM | POA: Diagnosis present

## 2020-03-27 DIAGNOSIS — K2950 Unspecified chronic gastritis without bleeding: Secondary | ICD-10-CM | POA: Diagnosis present

## 2020-03-27 DIAGNOSIS — K920 Hematemesis: Secondary | ICD-10-CM | POA: Diagnosis present

## 2020-03-27 DIAGNOSIS — R6521 Severe sepsis with septic shock: Secondary | ICD-10-CM | POA: Diagnosis present

## 2020-03-27 DIAGNOSIS — Z20822 Contact with and (suspected) exposure to covid-19: Secondary | ICD-10-CM | POA: Diagnosis present

## 2020-03-28 ENCOUNTER — Inpatient Hospital Stay: Payer: Self-pay

## 2020-03-28 ENCOUNTER — Inpatient Hospital Stay (HOSPITAL_COMMUNITY)
Admission: EM | Admit: 2020-03-28 | Discharge: 2020-04-02 | DRG: 871 | Disposition: A | Discharge status: Home Health | Payer: Medicare (Managed Care) | Attending: Internal Medicine | Admitting: Internal Medicine

## 2020-03-28 ENCOUNTER — Emergency Department (HOSPITAL_COMMUNITY): Payer: Medicare (Managed Care)

## 2020-03-28 ENCOUNTER — Inpatient Hospital Stay (HOSPITAL_COMMUNITY)
Admit: 2020-03-28 | Discharge: 2020-03-28 | Disposition: A | Discharge status: Home / Self Care | Payer: Medicare (Managed Care) | Attending: Pulmonary Disease | Admitting: Pulmonary Disease

## 2020-03-28 ENCOUNTER — Encounter (HOSPITAL_COMMUNITY)
Admission: EM | Disposition: A | Discharge status: Home Health | Payer: Self-pay | Source: Home / Self Care | Attending: Internal Medicine

## 2020-03-28 ENCOUNTER — Other Ambulatory Visit: Payer: Self-pay

## 2020-03-28 ENCOUNTER — Encounter (HOSPITAL_COMMUNITY): Payer: Self-pay | Admitting: Emergency Medicine

## 2020-03-28 ENCOUNTER — Inpatient Hospital Stay (HOSPITAL_COMMUNITY): Payer: Medicare (Managed Care)

## 2020-03-28 DIAGNOSIS — K2950 Unspecified chronic gastritis without bleeding: Secondary | ICD-10-CM | POA: Diagnosis present

## 2020-03-28 DIAGNOSIS — R401 Stupor: Secondary | ICD-10-CM

## 2020-03-28 DIAGNOSIS — J9601 Acute respiratory failure with hypoxia: Secondary | ICD-10-CM | POA: Diagnosis present

## 2020-03-28 DIAGNOSIS — E1151 Type 2 diabetes mellitus with diabetic peripheral angiopathy without gangrene: Secondary | ICD-10-CM | POA: Diagnosis present

## 2020-03-28 DIAGNOSIS — G40909 Epilepsy, unspecified, not intractable, without status epilepticus: Secondary | ICD-10-CM

## 2020-03-28 DIAGNOSIS — G9341 Metabolic encephalopathy: Secondary | ICD-10-CM | POA: Diagnosis present

## 2020-03-28 DIAGNOSIS — D649 Anemia, unspecified: Secondary | ICD-10-CM | POA: Diagnosis not present

## 2020-03-28 DIAGNOSIS — I10 Essential (primary) hypertension: Secondary | ICD-10-CM | POA: Diagnosis present

## 2020-03-28 DIAGNOSIS — R569 Unspecified convulsions: Secondary | ICD-10-CM

## 2020-03-28 DIAGNOSIS — I69359 Hemiplegia and hemiparesis following cerebral infarction affecting unspecified side: Secondary | ICD-10-CM | POA: Diagnosis not present

## 2020-03-28 DIAGNOSIS — K219 Gastro-esophageal reflux disease without esophagitis: Secondary | ICD-10-CM | POA: Diagnosis present

## 2020-03-28 DIAGNOSIS — E872 Acidosis, unspecified: Secondary | ICD-10-CM | POA: Diagnosis present

## 2020-03-28 DIAGNOSIS — K922 Gastrointestinal hemorrhage, unspecified: Secondary | ICD-10-CM

## 2020-03-28 DIAGNOSIS — D62 Acute posthemorrhagic anemia: Secondary | ICD-10-CM | POA: Diagnosis present

## 2020-03-28 DIAGNOSIS — R6521 Severe sepsis with septic shock: Secondary | ICD-10-CM | POA: Diagnosis present

## 2020-03-28 DIAGNOSIS — G894 Chronic pain syndrome: Secondary | ICD-10-CM | POA: Diagnosis present

## 2020-03-28 DIAGNOSIS — A419 Sepsis, unspecified organism: Secondary | ICD-10-CM | POA: Diagnosis present

## 2020-03-28 DIAGNOSIS — J189 Pneumonia, unspecified organism: Secondary | ICD-10-CM

## 2020-03-28 DIAGNOSIS — G8114 Spastic hemiplegia affecting left nondominant side: Secondary | ICD-10-CM | POA: Diagnosis present

## 2020-03-28 DIAGNOSIS — E669 Obesity, unspecified: Secondary | ICD-10-CM | POA: Diagnosis present

## 2020-03-28 DIAGNOSIS — E876 Hypokalemia: Secondary | ICD-10-CM | POA: Diagnosis not present

## 2020-03-28 DIAGNOSIS — Z20822 Contact with and (suspected) exposure to covid-19: Secondary | ICD-10-CM | POA: Diagnosis present

## 2020-03-28 DIAGNOSIS — G43709 Chronic migraine without aura, not intractable, without status migrainosus: Secondary | ICD-10-CM | POA: Diagnosis not present

## 2020-03-28 DIAGNOSIS — E1140 Type 2 diabetes mellitus with diabetic neuropathy, unspecified: Secondary | ICD-10-CM | POA: Diagnosis present

## 2020-03-28 DIAGNOSIS — IMO0002 Reserved for concepts with insufficient information to code with codable children: Secondary | ICD-10-CM | POA: Diagnosis present

## 2020-03-28 DIAGNOSIS — K449 Diaphragmatic hernia without obstruction or gangrene: Secondary | ICD-10-CM | POA: Diagnosis present

## 2020-03-28 DIAGNOSIS — J9600 Acute respiratory failure, unspecified whether with hypoxia or hypercapnia: Secondary | ICD-10-CM | POA: Diagnosis present

## 2020-03-28 DIAGNOSIS — E781 Pure hyperglyceridemia: Secondary | ICD-10-CM | POA: Diagnosis present

## 2020-03-28 DIAGNOSIS — G43909 Migraine, unspecified, not intractable, without status migrainosus: Secondary | ICD-10-CM | POA: Diagnosis present

## 2020-03-28 DIAGNOSIS — R509 Fever, unspecified: Secondary | ICD-10-CM | POA: Diagnosis present

## 2020-03-28 DIAGNOSIS — E7800 Pure hypercholesterolemia, unspecified: Secondary | ICD-10-CM | POA: Diagnosis present

## 2020-03-28 DIAGNOSIS — E785 Hyperlipidemia, unspecified: Secondary | ICD-10-CM | POA: Diagnosis present

## 2020-03-28 DIAGNOSIS — K920 Hematemesis: Secondary | ICD-10-CM | POA: Diagnosis present

## 2020-03-28 HISTORY — PX: ESOPHAGOGASTRODUODENOSCOPY: SHX5428

## 2020-03-28 LAB — CBC WITH DIFFERENTIAL/PLATELET
Abs Immature Granulocytes: 0.04 10*3/uL (ref 0.00–0.07)
Basophils Absolute: 0 10*3/uL (ref 0.0–0.1)
Basophils Relative: 0 %
Eosinophils Absolute: 0.1 10*3/uL (ref 0.0–0.5)
Eosinophils Relative: 1 %
HCT: 21.4 % — ABNORMAL LOW (ref 36.0–46.0)
Hemoglobin: 6 g/dL — CL (ref 12.0–15.0)
Immature Granulocytes: 0 %
Lymphocytes Relative: 8 %
Lymphs Abs: 0.8 10*3/uL (ref 0.7–4.0)
MCH: 24.4 pg — ABNORMAL LOW (ref 26.0–34.0)
MCHC: 28 g/dL — ABNORMAL LOW (ref 30.0–36.0)
MCV: 87 fL (ref 80.0–100.0)
Monocytes Absolute: 0.6 10*3/uL (ref 0.1–1.0)
Monocytes Relative: 6 %
Neutro Abs: 8.2 10*3/uL — ABNORMAL HIGH (ref 1.7–7.7)
Neutrophils Relative %: 85 %
Platelets: 144 10*3/uL — ABNORMAL LOW (ref 150–400)
RBC: 2.46 MIL/uL — ABNORMAL LOW (ref 3.87–5.11)
RDW: 17.5 % — ABNORMAL HIGH (ref 11.5–15.5)
WBC: 9.6 10*3/uL (ref 4.0–10.5)
nRBC: 0 % (ref 0.0–0.2)

## 2020-03-28 LAB — BLOOD GAS, ARTERIAL
Acid-Base Excess: 0.9 mmol/L (ref 0.0–2.0)
Allens test (pass/fail): POSITIVE — AB
Bicarbonate: 27.8 mmol/L (ref 20.0–28.0)
Drawn by: 11249
FIO2: 100
MECHVT: 380 mL
O2 Saturation: 98.3 %
PEEP: 5 cmH2O
Patient temperature: 94.2
RATE: 16 resp/min
pCO2 arterial: 53.6 mmHg — ABNORMAL HIGH (ref 32.0–48.0)
pH, Arterial: 7.319 — ABNORMAL LOW (ref 7.350–7.450)
pO2, Arterial: 120 mmHg — ABNORMAL HIGH (ref 83.0–108.0)

## 2020-03-28 LAB — COMPREHENSIVE METABOLIC PANEL
ALT: 17 U/L (ref 0–44)
AST: 13 U/L — ABNORMAL LOW (ref 15–41)
Albumin: 1.5 g/dL — ABNORMAL LOW (ref 3.5–5.0)
Alkaline Phosphatase: 49 U/L (ref 38–126)
Anion gap: 14 (ref 5–15)
BUN: 9 mg/dL (ref 8–23)
CO2: 16 mmol/L — ABNORMAL LOW (ref 22–32)
Calcium: 7 mg/dL — ABNORMAL LOW (ref 8.9–10.3)
Chloride: 105 mmol/L (ref 98–111)
Creatinine, Ser: 0.38 mg/dL — ABNORMAL LOW (ref 0.44–1.00)
GFR calc Af Amer: >60 mL/min (ref >60)
GFR calc non Af Amer: >60 mL/min (ref >60)
Glucose, Bld: 77 mg/dL (ref 70–99)
Potassium: 4 mmol/L (ref 3.5–5.1)
Sodium: 135 mmol/L (ref 135–145)
Total Bilirubin: 0.6 mg/dL (ref 0.3–1.2)
Total Protein: 3.1 g/dL — ABNORMAL LOW (ref 6.5–8.1)

## 2020-03-28 LAB — RAPID URINE DRUG SCREEN, HOSP PERFORMED
Amphetamines: NOT DETECTED
Barbiturates: NOT DETECTED
Benzodiazepines: NOT DETECTED
Cocaine: NOT DETECTED
Opiates: POSITIVE — AB
Tetrahydrocannabinol: NOT DETECTED

## 2020-03-28 LAB — CBC
HCT: 34.2 % — ABNORMAL LOW (ref 36.0–46.0)
HCT: 33 % — ABNORMAL LOW (ref 36.0–46.0)
Hemoglobin: 10 g/dL — ABNORMAL LOW (ref 12.0–15.0)
Hemoglobin: 9.7 g/dL — ABNORMAL LOW (ref 12.0–15.0)
MCH: 24.5 pg — ABNORMAL LOW (ref 26.0–34.0)
MCH: 24.1 pg — ABNORMAL LOW (ref 26.0–34.0)
MCHC: 29.2 g/dL — ABNORMAL LOW (ref 30.0–36.0)
MCHC: 29.4 g/dL — ABNORMAL LOW (ref 30.0–36.0)
MCV: 83.8 fL (ref 80.0–100.0)
MCV: 82.1 fL (ref 80.0–100.0)
Platelets: 257 10*3/uL (ref 150–400)
Platelets: 64 10*3/uL — ABNORMAL LOW (ref 150–400)
RBC: 4.08 MIL/uL (ref 3.87–5.11)
RBC: 4.02 MIL/uL (ref 3.87–5.11)
RDW: 17.4 % — ABNORMAL HIGH (ref 11.5–15.5)
RDW: 17.3 % — ABNORMAL HIGH (ref 11.5–15.5)
WBC: 20.8 10*3/uL — ABNORMAL HIGH (ref 4.0–10.5)
WBC: 19.8 10*3/uL — ABNORMAL HIGH (ref 4.0–10.5)
nRBC: 0 % (ref 0.0–0.2)
nRBC: 0 % (ref 0.0–0.2)

## 2020-03-28 LAB — URINALYSIS, ROUTINE W REFLEX MICROSCOPIC
Bilirubin Urine: NEGATIVE
Glucose, UA: NEGATIVE mg/dL
Hgb urine dipstick: NEGATIVE
Ketones, ur: NEGATIVE mg/dL
Leukocytes,Ua: NEGATIVE
Nitrite: NEGATIVE
Protein, ur: NEGATIVE mg/dL
Specific Gravity, Urine: 1.017 (ref 1.005–1.030)
pH: 5 (ref 5.0–8.0)

## 2020-03-28 LAB — PROTIME-INR
INR: 1.6 — ABNORMAL HIGH (ref 0.8–1.2)
Prothrombin Time: 18 seconds — ABNORMAL HIGH (ref 11.4–15.2)

## 2020-03-28 LAB — BRAIN NATRIURETIC PEPTIDE: B Natriuretic Peptide: 42.1 pg/mL (ref 0.0–100.0)

## 2020-03-28 LAB — ABO/RH: ABO/RH(D): O NEG

## 2020-03-28 LAB — GLUCOSE, CAPILLARY
Glucose-Capillary: 122 mg/dL — ABNORMAL HIGH (ref 70–99)
Glucose-Capillary: 115 mg/dL — ABNORMAL HIGH (ref 70–99)
Glucose-Capillary: 91 mg/dL (ref 70–99)
Glucose-Capillary: 98 mg/dL (ref 70–99)

## 2020-03-28 LAB — CREATININE, SERUM
Creatinine, Ser: 0.68 mg/dL (ref 0.44–1.00)
GFR calc Af Amer: >60 mL/min (ref >60)
GFR calc non Af Amer: >60 mL/min (ref >60)

## 2020-03-28 LAB — PREPARE RBC (CROSSMATCH)

## 2020-03-28 LAB — PROCALCITONIN: Procalcitonin: 2.4 ng/mL

## 2020-03-28 LAB — HEMOGLOBIN A1C
Hgb A1c MFr Bld: 7.1 % — ABNORMAL HIGH (ref 4.8–5.6)
Mean Plasma Glucose: 157.07 mg/dL

## 2020-03-28 LAB — LACTIC ACID, PLASMA
Lactic Acid, Venous: >11 mmol/L (ref 0.5–1.9)
Lactic Acid, Venous: 6.1 mmol/L (ref 0.5–1.9)
Lactic Acid, Venous: 1.2 mmol/L (ref 0.5–1.9)
Lactic Acid, Venous: 1.9 mmol/L (ref 0.5–1.9)

## 2020-03-28 LAB — TROPONIN I (HIGH SENSITIVITY): Troponin I (High Sensitivity): 3 ng/L (ref <18)

## 2020-03-28 LAB — APTT: aPTT: 41 seconds — ABNORMAL HIGH (ref 24–36)

## 2020-03-28 LAB — HIV ANTIBODY (ROUTINE TESTING W REFLEX): HIV Screen 4th Generation wRfx: NONREACTIVE

## 2020-03-28 LAB — MRSA PCR SCREENING: MRSA by PCR: NEGATIVE

## 2020-03-28 LAB — SARS CORONAVIRUS 2 BY RT PCR (HOSPITAL ORDER, PERFORMED IN ~~LOC~~ HOSPITAL LAB): SARS Coronavirus 2: NEGATIVE

## 2020-03-28 SURGERY — EGD (ESOPHAGOGASTRODUODENOSCOPY)
Anesthesia: Moderate Sedation

## 2020-03-28 MED ORDER — POLYETHYLENE GLYCOL 3350 17 G PO PACK: 17.0000 g | PACK | Freq: Every day | ORAL | Status: DC | PRN

## 2020-03-28 MED ORDER — ORAL CARE MOUTH RINSE
15.0000 mL | OROMUCOSAL | Status: DC
  Administered 2020-03-28 – 2020-03-29 (×10): 15 mL via OROMUCOSAL

## 2020-03-28 MED ORDER — SODIUM CHLORIDE 0.9 % IV SOLN
2.0000 g | Freq: Three times a day (TID) | INTRAVENOUS | Status: DC
  Administered 2020-03-28 – 2020-03-30 (×6): 2 g via INTRAVENOUS
  Filled 2020-03-28 (×6): qty 2

## 2020-03-28 MED ORDER — LACTATED RINGERS IV SOLN: INTRAVENOUS | Status: DC

## 2020-03-28 MED ORDER — PANTOPRAZOLE SODIUM 40 MG IV SOLR: 40.0000 mg | Freq: Two times a day (BID) | INTRAVENOUS | Status: DC

## 2020-03-28 MED ORDER — AMITRIPTYLINE HCL 25 MG PO TABS
100.0000 mg | ORAL_TABLET | Freq: Every day | ORAL | Status: DC
  Administered 2020-03-30 – 2020-04-01 (×3): 100 mg via ORAL
  Filled 2020-03-28 (×3): qty 4

## 2020-03-28 MED ORDER — CHLORHEXIDINE GLUCONATE 0.12% ORAL RINSE (MEDLINE KIT)
15.0000 mL | Freq: Two times a day (BID) | OROMUCOSAL | Status: DC
  Administered 2020-03-28 (×2): 15 mL via OROMUCOSAL

## 2020-03-28 MED ORDER — SODIUM CHLORIDE 0.9 % IV SOLN
750.0000 mg | Freq: Two times a day (BID) | INTRAVENOUS | Status: DC
  Administered 2020-03-28 (×2): 750 mg via INTRAVENOUS
  Filled 2020-03-28 (×6): qty 7.5

## 2020-03-28 MED ORDER — LACTATED RINGERS IV BOLUS
1000.0000 mL | Freq: Once | INTRAVENOUS | Status: AC
  Administered 2020-03-28: 1000 mL via INTRAVENOUS

## 2020-03-28 MED ORDER — ENOXAPARIN SODIUM 40 MG/0.4ML ~~LOC~~ SOLN: 40.0000 mg | SUBCUTANEOUS | Status: DC

## 2020-03-28 MED ORDER — SODIUM CHLORIDE 0.9% FLUSH
10.0000 mL | Freq: Two times a day (BID) | INTRAVENOUS | Status: DC
  Administered 2020-03-28: 10 mL
  Administered 2020-03-29: 30 mL
  Administered 2020-03-29 – 2020-04-01 (×7): 10 mL

## 2020-03-28 MED ORDER — VANCOMYCIN HCL IN DEXTROSE 1-5 GM/200ML-% IV SOLN
1000.0000 mg | Freq: Two times a day (BID) | INTRAVENOUS | Status: DC
  Administered 2020-03-28 – 2020-03-30 (×5): 1000 mg via INTRAVENOUS
  Filled 2020-03-28 (×5): qty 200

## 2020-03-28 MED ORDER — ACETAMINOPHEN 650 MG RE SUPP
650.0000 mg | Freq: Once | RECTAL | Status: AC
  Administered 2020-03-28: 650 mg via RECTAL
  Filled 2020-03-28: qty 1

## 2020-03-28 MED ORDER — ROCURONIUM BROMIDE 10 MG/ML (PF) SYRINGE
PREFILLED_SYRINGE | INTRAVENOUS | Status: AC
  Administered 2020-03-28: 100 mg
  Filled 2020-03-28: qty 10

## 2020-03-28 MED ORDER — VANCOMYCIN HCL 1500 MG/300ML IV SOLN
1500.0000 mg | Freq: Once | INTRAVENOUS | Status: AC
  Administered 2020-03-28: 1500 mg via INTRAVENOUS
  Filled 2020-03-28: qty 300

## 2020-03-28 MED ORDER — METRONIDAZOLE IN NACL 5-0.79 MG/ML-% IV SOLN
500.0000 mg | Freq: Three times a day (TID) | INTRAVENOUS | Status: DC
  Administered 2020-03-28 – 2020-03-30 (×6): 500 mg via INTRAVENOUS
  Filled 2020-03-28 (×6): qty 100

## 2020-03-28 MED ORDER — METRONIDAZOLE IN NACL 5-0.79 MG/ML-% IV SOLN
500.0000 mg | Freq: Once | INTRAVENOUS | Status: AC
  Administered 2020-03-28: 500 mg via INTRAVENOUS
  Filled 2020-03-28: qty 100

## 2020-03-28 MED ORDER — SODIUM CHLORIDE 0.9 % IV SOLN
2.0000 g | Freq: Once | INTRAVENOUS | Status: AC
  Administered 2020-03-28: 2 g via INTRAVENOUS
  Filled 2020-03-28: qty 2

## 2020-03-28 MED ORDER — SODIUM CHLORIDE 0.9% FLUSH: 10.0000 mL | INTRAVENOUS | Status: DC | PRN

## 2020-03-28 MED ORDER — DOCUSATE SODIUM 100 MG PO CAPS: 100.0000 mg | ORAL_CAPSULE | Freq: Two times a day (BID) | ORAL | Status: DC | PRN

## 2020-03-28 MED ORDER — LACTATED RINGERS IV BOLUS (SEPSIS)
500.0000 mL | Freq: Once | INTRAVENOUS | Status: AC
  Administered 2020-03-28: 500 mL via INTRAVENOUS

## 2020-03-28 MED ORDER — SODIUM CHLORIDE 0.9% FLUSH
10.0000 mL | Freq: Two times a day (BID) | INTRAVENOUS | Status: DC
  Administered 2020-03-28 – 2020-03-30 (×4): 10 mL

## 2020-03-28 MED ORDER — IOHEXOL 350 MG/ML SOLN
100.0000 mL | Freq: Once | INTRAVENOUS | Status: AC | PRN
  Administered 2020-03-28: 100 mL via INTRAVENOUS

## 2020-03-28 MED ORDER — SODIUM CHLORIDE 0.9 % IV SOLN
80.0000 mg | Freq: Once | INTRAVENOUS | Status: AC
  Administered 2020-03-28: 05:00:00 80 mg via INTRAVENOUS
  Filled 2020-03-28: qty 80

## 2020-03-28 MED ORDER — INSULIN ASPART 100 UNIT/ML ~~LOC~~ SOLN
0.0000 [IU] | SUBCUTANEOUS | Status: DC
  Administered 2020-03-28 – 2020-03-31 (×6): 2 [IU] via SUBCUTANEOUS
  Filled 2020-03-28: qty 0.15

## 2020-03-28 MED ORDER — PROPOFOL 1000 MG/100ML IV EMUL
5.0000 ug/kg/min | INTRAVENOUS | Status: DC
  Administered 2020-03-28: 60 ug/kg/min via INTRAVENOUS
  Administered 2020-03-28: 50 ug/kg/min via INTRAVENOUS
  Administered 2020-03-28 – 2020-03-29 (×2): 60 ug/kg/min via INTRAVENOUS
  Administered 2020-03-29: 70 ug/kg/min via INTRAVENOUS
  Filled 2020-03-28 (×6): qty 100

## 2020-03-28 MED ORDER — PROPOFOL 1000 MG/100ML IV EMUL
INTRAVENOUS | Status: AC
  Administered 2020-03-28: 5 ug/kg/min via INTRAVENOUS
  Filled 2020-03-28: qty 100

## 2020-03-28 MED ORDER — PROPOFOL BOLUS VIA INFUSION
0.5000 mg/kg | Freq: Once | INTRAVENOUS | Status: AC
  Administered 2020-03-28: 43.5 mg via INTRAVENOUS
  Filled 2020-03-28: qty 44

## 2020-03-28 MED ORDER — MIDAZOLAM HCL (PF) 5 MG/ML IJ SOLN
INTRAMUSCULAR | Status: AC
  Filled 2020-03-28: qty 1

## 2020-03-28 MED ORDER — FENTANYL CITRATE (PF) 100 MCG/2ML IJ SOLN
INTRAMUSCULAR | Status: AC
  Filled 2020-03-28: qty 2

## 2020-03-28 MED ORDER — SODIUM CHLORIDE 0.9 % IV SOLN
8.0000 mg/h | INTRAVENOUS | Status: DC
  Administered 2020-03-28 – 2020-03-29 (×3): 8 mg/h via INTRAVENOUS
  Filled 2020-03-28 (×4): qty 80

## 2020-03-28 MED ORDER — VANCOMYCIN HCL IN DEXTROSE 1-5 GM/200ML-% IV SOLN: 1000.0000 mg | Freq: Once | INTRAVENOUS | Status: DC

## 2020-03-28 MED ORDER — LACTATED RINGERS IV BOLUS (SEPSIS)
1000.0000 mL | Freq: Once | INTRAVENOUS | Status: AC
  Administered 2020-03-28: 1000 mL via INTRAVENOUS

## 2020-03-28 MED ORDER — SODIUM CHLORIDE 0.9 % IV SOLN: INTRAVENOUS | Status: DC

## 2020-03-28 MED ORDER — CHLORHEXIDINE GLUCONATE CLOTH 2 % EX PADS
6.0000 | MEDICATED_PAD | Freq: Every day | CUTANEOUS | Status: DC
  Administered 2020-03-28 – 2020-04-01 (×6): 6 via TOPICAL

## 2020-03-28 NOTE — Progress Notes (Signed)
Peripherally Inserted Central Catheter Placement  The IV Nurse has discussed with the patient and/or persons authorized to consent for the patient, the purpose of this procedure and the potential benefits and risks involved with this procedure.  The benefits include less needle sticks, lab draws from the catheter, and the patient may be discharged home with the catheter. Risks include, but not limited to, infection, bleeding, blood clot (thrombus formation), and puncture of an artery; nerve damage and irregular heartbeat and possibility to perform a PICC exchange if needed/ordered by physician.  Alternatives to this procedure were also discussed.  Bard Power PICC patient education guide, fact sheet on infection prevention and patient information card has been provided to patient /or left at bedside.Placed by Carolee Rota RN.   PICC Placement Documentation  PICC Triple Lumen 03/28/20 PICC Right Brachial 36 cm 0 cm (Active)  Indication for Insertion or Continuance of Line Vasoactive infusions 03/28/20 2229  Exposed Catheter (cm) 0 cm 03/28/20 2229  Site Assessment Clean;Dry;Intact 03/28/20 2229  Lumen #1 Status Blood return noted;Flushed;Saline locked 03/28/20 2229  Lumen #2 Status Blood return noted;Flushed;Saline locked 03/28/20 2229  Lumen #3 Status Blood return noted;Flushed;Saline locked 03/28/20 2229  Dressing Type Transparent;Occlusive;Securing device 03/28/20 2229  Dressing Status Clean;Dry;Intact;Antimicrobial disc in place 03/28/20 2229  Line Adjustment (NICU/IV Team Only) No 03/28/20 2229  Dressing Intervention New dressing 03/28/20 2229  Dressing Change Due 04/04/20 03/28/20 2229       Jordan Rivas 03/28/2020, 10:32 PM

## 2020-03-28 NOTE — Procedures (Signed)
Patient Name: Jordan Rivas  MRN: [id number]  Epilepsy Attending: Lora Havens  Referring Physician/Provider: Dr. Marshell Garfinkel Date: 03/28/2020 Duration: 23.34 minutes  Patient history: 59 year old female with right hemispheric for stroke due to brain aneurysm, seizure disorder who presented with altered mental status.  EEG evaluate for seizures.  Level of alertness: Comatose  AEDs during EEG study: Keppra, propofol  Technical aspects: This EEG study was done with scalp electrodes positioned according to the 10-20 International system of electrode placement. Electrical activity was acquired at a sampling rate of 500Hz  and reviewed with a high frequency filter of 70Hz  and a low frequency filter of 1Hz . EEG data were recorded continuously and digitally stored.   Description: EEG showed continuous generalized 3 to 5 Hz theta-delta slowing.  Hyperventilation and photic stimulation were not performed.     ABNORMALITY -Continuous slow, generalized  IMPRESSION: This study is suggestive of severe diffuse encephalopathy, nonspecific etiology but could be related to sedation.  No seizures or epileptiform discharges were seen throughout the recording.  [name] [name]

## 2020-03-28 NOTE — H&P (Addendum)
NAME:  Jordan Rivas, MRN:  [id number], DOB:  02-12-1961, LOS: 0 ADMISSION DATE:  03/28/2020, CONSULTATION DATE: 03/28/2020 REFERRING MD: EDP, CHIEF COMPLAINT: Altered mental status, fevers, GI bleed  Brief History   59 year old with history as noted below Presenting with altered mental status, hypoxic to the 50s at home, fevers of 102.  Intubated in the ED Coffee-ground emesis noted in the posterior pharynx with hemoglobin of 6. PCCM consulted for admission.  Past Medical History    has a past medical history of Chronic pain, CVA (cerebrovascular accident) ([place]) (1995), Dental caries, Depression, Diabetes mellitus without complication ([place]), GERD (gastroesophageal reflux disease), Headache, Hypercholesteremia, Hypertension, Insomnia, Neuropathy, Overactive bladder, Peripheral vascular disease ([place]), Pneumonia, and Seizures ([place]) (01/27/12).   History of right hemispheric stroke due to brain aneurysm at age 87 with residual left spastic hemiparesis, gait difficulty.  Seizure disorder with breakthrough seizures in 2018.  Significant Hospital Events   7/26-admit  Consults:    Procedures:  ET tube 7/26  Significant Diagnostic Tests:  CT head 7/26-no acute intracranial abnormality, old MCA territory infarct, meningioma.  Micro Data:  Blood cultures 7/26  Antimicrobials:  Vanco 7/26 Cefepime 7/26 Flagyl 7/26  Interim history/subjective:    Objective   Blood pressure (!) 114/60, pulse 99, temperature 99.5 F (37.5 C), resp. rate 16, height 5\' 1"  (1.549 m), weight 87 kg, last menstrual period 09/30/2011, SpO2 100 %.    Vent Mode: PRVC FiO2 (%):  [100 %] 100 % Vt Set:  [380 mL] 380 mL PEEP:  [5 cmH20] 5 cmH20 Plateau Pressure:  [27 cmH20] 27 cmH20   Intake/Output Summary (Last 24 hours) at 03/28/2020 0825 Last data filed at 03/28/2020 0421 Gross per 24 hour  Intake 1649.63 ml  Output 700 ml  Net 949.63 ml   Filed Weights   03/28/20 0027  Weight: 87 kg     Examination: Blood pressure (!) 114/60, pulse 99, temperature 99.5 F (37.5 C), resp. rate 16, height 5\' 1"  (1.549 m), weight 87 kg, last menstrual period 09/30/2011, SpO2 100 %. Gen:      No acute distress HEENT:  EOMI, sclera anicteric, ETT Neck:     No masses; no thyromegaly Lungs:    Clear to auscultation bilaterally; normal respiratory effort CV:         Regular rate and rhythm; no murmurs Abd:      + bowel sounds; soft, non-tender; no palpable masses, no distension Ext:    No edema; adequate peripheral perfusion Skin:      Warm and dry; no rash Neuro: [facility] Problem list     Assessment & Plan:  Altered mental status History of CVA, hemiparesis, seizures No acute changes on CT scan EEG, consider MRI brain and EEG Continue Keppra and Elavil  Low hemoglobin, possible GI bleed Continue Protonix drip. GI consult Transfuse PRBC  Fevers, lactic acidosis Unclear if she is actually infected. Continue broad antibiotic coverage for now and follow cultures  Acute respiratory failure, hypoxia Get CT chest, CT abdomen pelvis to look for sources of infection and rule out PE Follow ABG and chest x-ray  Diabetes mellitus Hold Metformin given lactic acidosis SSI coverage  Goals of care Discussed with son.  Patient had previously told him that she would not want CPR in case of cardiac arrest Limited code order entered.  Best practice:  Diet: NPO Pain/Anxiety/Delirium protocol (if indicated): propofol VAP protocol (if indicated): Ordered DVT prophylaxis: SCDs GI prophylaxis: PPI Glucose control: [place] Mobility: Bed  Code Status: Full Family Communication: Son updated Disposition: ICU  Labs   CBC: Recent Labs  Lab 03/28/20 0220  WBC 9.6  NEUTROABS 8.2*  HGB 6.0*  HCT 21.4*  MCV 87.0  PLT 144*    Basic Metabolic Panel: Recent Labs  Lab 03/28/20 0220  NA 135  K 4.0  CL 105  CO2 16*  GLUCOSE 77  BUN 9  CREATININE 0.38*  CALCIUM  7.0*   GFR: Estimated Creatinine Clearance: 74 mL/min (A) (by C-G formula based on SCr of 0.38 mg/dL (L)). Recent Labs  Lab 03/28/20 0220 03/28/20 0430  WBC 9.6  --   LATICACIDVEN >11.0* 6.1*    Liver Function Tests: Recent Labs  Lab 03/28/20 0220  AST 13*  ALT 17  ALKPHOS 49  BILITOT 0.6  PROT 3.1*  ALBUMIN 1.5*   No results for input(s): LIPASE, AMYLASE in the last 168 hours. No results for input(s): AMMONIA in the last 168 hours.  ABG    Component Value Date/Time   PHART 7.319 (L) 03/28/2020 0440   PCO2ART 53.6 (H) 03/28/2020 0440   PO2ART 120 (H) 03/28/2020 0440   HCO3 27.8 03/28/2020 0440   O2SAT 98.3 03/28/2020 0440     Coagulation Profile: Recent Labs  Lab 03/28/20 0220  INR 1.6*    Cardiac Enzymes: No results for input(s): CKTOTAL, CKMB, CKMBINDEX, TROPONINI in the last 168 hours.  HbA1C: Hgb A1c MFr Bld  Date/Time Value Ref Range Status  09/13/2017 08:13 AM 6.5 (H) 4.8 - 5.6 % Final    Comment:    (NOTE) Pre diabetes:          5.7%-6.4% Diabetes:              >6.4% Glycemic control for   <7.0% adults with diabetes   01/28/2012 04:45 AM 6.2 (H) <5.7 % Final    Comment:    (NOTE)                                                                       According to the ADA Clinical Practice Recommendations for 2011, when HbA1c is used as a screening test:  >=6.5%   Diagnostic of Diabetes Mellitus           (if abnormal result is confirmed) 5.7-6.4%   Increased risk of developing Diabetes Mellitus References:Diagnosis and Classification of Diabetes Mellitus,Diabetes ZOXW,9604,54(UJWJX 1):S62-S69 and Standards of Medical Care in         Diabetes - 2011,Diabetes Care,2011,34 (Suppl 1):S11-S61.    CBG: No results for input(s): GLUCAP in the last 168 hours.  Review of Systems:   Unable to obtain due to altered mental status  Past Medical History  She,  has a past medical history of Chronic pain, CVA (cerebrovascular accident) ([place]) (1995),  Dental caries, Depression, Diabetes mellitus without complication ([place]), GERD (gastroesophageal reflux disease), Headache, Hypercholesteremia, Hypertension, Insomnia, Neuropathy, Overactive bladder, Peripheral vascular disease ([place]), Pneumonia, and Seizures ([place]) (01/27/12).   Surgical History    Past Surgical History:  Procedure Laterality Date  . CEREBRAL ANEURYSM REPAIR  11/1993   "left my left side paralyzed"  . DENTAL RESTORATION/EXTRACTION WITH X-RAY N/A 09/13/2017   Procedure: DENTAL RESTORATION/EXTRACTION;  Surgeon: Diona Browner, DDS;  Location: [facility];  Service:  Oral Surgery;  Laterality: N/A;  . TUBAL LIGATION  04/1984     Social History   reports that she has never smoked. She has never used smokeless tobacco. She reports that she does not drink alcohol and does not use drugs.   Family History   Her family history includes Breast cancer in her mother; Dementia in her mother; Diabetes in her mother; Diabetes type II in her mother; Heart attack in her father.   Allergies Allergies  Allergen Reactions  . Celebrex [Celecoxib] Swelling and Other (See Comments)    "lips; it was terrible"  . Baclofen Swelling  . Aspirin   . Zolpidem      Home Medications  Prior to Admission medications   Medication Sig Start Date End Date Taking? Authorizing Provider  Alcohol Swabs (ALCOHOL PREP) 70 % PADS  10/30/17   [provider]  amitriptyline (ELAVIL) 100 MG tablet Take 100 mg by mouth at bedtime.  02/17/15   [provider]  AQUALANCE LANCETS 30G [place]  10/30/17   [provider]  aspirin 81 MG chewable tablet Chew 1 tablet (81 mg total) by mouth daily. 03/03/13   Rai, Vernelle Emerald, MD  atorvastatin (LIPITOR) 40 MG tablet Take 40 mg by mouth daily.  02/23/15   [provider]  Blood Glucose Calibration (ONETOUCH VERIO) SOLN  10/30/17   [provider]  botulinum toxin Type A (BOTOX) 100 units SOLR injection Inject 500 Units into the muscle every 3  (three) months. 12/30/19   Marcial Pacas, MD  CARAFATE 1 GM/10ML suspension  09/30/17   [provider]  Cholecalciferol (VITAMIN D) 1000 UNITS capsule Take 1,000 Units by mouth daily.    [provider]  DULoxetine (CYMBALTA) 60 MG capsule Take 60 mg by mouth daily.    [provider]  Erenumab-aooe (AIMOVIG) 70 MG/ML SOAJ Inject 70 mg into the skin every 30 (thirty) days. 08/19/19   Marcial Pacas, MD  esomeprazole (NEXIUM) 40 MG capsule Take 40 mg by mouth 2 (two) times daily.    [provider]  ketoconazole (NIZORAL) 2 % cream Apply 1 application topically 2 (two) times daily as needed for irritation.    [provider]  levETIRAcetam (KEPPRA) 750 MG tablet Take 1 tablet (750 mg total) by mouth 2 (two) times daily. 06/23/19   Marcial Pacas, MD  lidocaine (XYLOCAINE) 5 % ointment Apply 1 application topically 4 (four) times daily as needed for moderate pain.  08/21/17   [provider]  meloxicam (MOBIC) 15 MG tablet Take 15 mg by mouth daily.    [provider]  metFORMIN (GLUCOPHAGE) 500 MG tablet Take 500 mg by mouth daily. 09/04/17   [provider]  metoprolol succinate (TOPROL-XL) 25 MG 24 hr tablet Take 1 tablet (25 mg total) by mouth daily. 03/03/13   Rai, Ripudeep K, MD  ondansetron (ZOFRAN) 4 MG tablet Take one tab every 8 hours prn for nausea.  #20 per month. 10/14/19   Marcial Pacas, MD  [facility] VERIO test strip  10/30/17   [provider]  oxybutynin (DITROPAN-XL) 10 MG 24 hr tablet Take 10 mg by mouth 2 (two) times daily.    [provider]  oxyCODONE (ROXICODONE) 15 MG immediate release tablet Take 15 mg by mouth 4 (four) times daily as needed for pain.     [provider]  pregabalin (LYRICA) 75 MG capsule TAKE 1 CAPSULE BY MOUTH THREE TIMES DAILY 11/23/19   Marcial Pacas, MD  tiZANidine (ZANAFLEX) 2 MG tablet TAKE 2 TABLETS BY MOUTH FOUR TIMES DAILY AS NEEDED FOR MUSCLE SPASMS 03/21/20   Marcial Pacas, MD      Critical care time:    The patient is critically ill with multiple organ system failure and requires high complexity decision making for assessment and support, frequent evaluation and titration of therapies, advanced monitoring, review of radiographic studies and interpretation of complex data.   Critical Care Time devoted to patient care services, exclusive of separately billable procedures, described in this note is 35 minutes.   Marshell Garfinkel MD [facility] Please see [email or web address] for pager details.  03/28/2020, 8:44 AM

## 2020-03-28 NOTE — ED Notes (Signed)
Patient transported to CT 

## 2020-03-28 NOTE — ED Notes (Signed)
Date and time results received: 03/28/20 0325 (use smartphrase ".now" to insert current time)  Test: Lactic Acid, Hgb Critical Value: >11, 6.0  Name of Provider Notified: Dr. Tomi Bamberger  Orders Received? Or Actions Taken?:

## 2020-03-28 NOTE — ED Notes (Signed)
Son Barbaraann Rondo) can be reached at [phone] and would like to be called when patient is admitted.

## 2020-03-28 NOTE — Progress Notes (Signed)
Notified bedside nurse of need to draw lactic acid.  

## 2020-03-28 NOTE — Progress Notes (Signed)
EEG completed, results pending. 

## 2020-03-28 NOTE — ED Notes (Signed)
0341 80mg  of Rocuronium in and flushed 0342 Patient manually bagged by respiratory 0343 CO2 color change + 0344 24 at the lip 0345 23 at the lip 0346 22 at the lip 99% O2

## 2020-03-28 NOTE — ED Provider Notes (Addendum)
[facility] DEPT Provider Note   CSN: [id number] Arrival date & time: 03/27/20  2358   Time seen 12:25 AM  History Chief Complaint  Patient presents with  . Altered Mental Status   Level 5 caveat for altered mental status  Jordan Rivas is a 59 y.o. female.  HPI   Patient presents via EMS with altered mental status.  EMS noted her pulse ox was in the 50% per fire first responders.  She was placed on a nonrebreather mask by EMS and it improved to 98 to 100%.  Patient has prior stroke from an aneurysm when she was 59 years old and has left-sided paralysis.  She is being followed by neurology and has been describing increasing headaches.  She has also been getting Botox injections for her spastic paralysis and chronic pain in her left extremities.  She did have a seizure in 2014 and is on Keppra.  PCP Mayra Neer, MD Neurology Dr Krista Blue  Past Medical History:  Diagnosis Date  . Chronic pain    "in this left arm and leg that don't move"  . CVA (cerebrovascular accident) ([place]) 1995   "left side paralyzed"  . Dental caries   . Depression   . Diabetes mellitus without complication ([place])   . GERD (gastroesophageal reflux disease)   . Headache   . Hypercholesteremia   . Hypertension   . Insomnia   . Neuropathy   . Overactive bladder   . Peripheral vascular disease ([place])    left arm and leg; since stroke 1995  . Pneumonia   . Seizures ([place]) 01/27/12   "first one ever"    Patient Active Problem List   Diagnosis Date Noted  . Chronic migraine 08/19/2019  . Seizure disorder ([place]) 06/23/2019  . Spastic hemiplegia of left nondominant side as late effect of cerebral infarction ([place]) 06/17/2019  . Insomnia 10/27/2014  . Spastic hemiplegia affecting left nondominant side ([place]) 03/04/2014  . NSTEMI (non-ST elevated myocardial infarction) ([place]) 02/27/2013  . Acute combined systolic and diastolic heart failure ([place]) 02/25/2013  . Sepsis associated  hypotension ([place]) 02/21/2013  . UTI (lower urinary tract infection) 02/21/2013  . ARF (acute renal failure) ([place]) 02/21/2013  . Fall 02/21/2013  . Altered mental status 02/21/2013  . Severe sepsis ([place]) 02/21/2013  . Seizure ([place]) 01/28/2012  . H/O 01/28/2012  . Hypertension   . Hypercholesteremia   . GERD (gastroesophageal reflux disease)     Past Surgical History:  Procedure Laterality Date  . CEREBRAL ANEURYSM REPAIR  11/1993   "left my left side paralyzed"  . DENTAL RESTORATION/EXTRACTION WITH X-RAY N/A 09/13/2017   Procedure: DENTAL RESTORATION/EXTRACTION;  Surgeon: Diona Browner, DDS;  Location: [facility];  Service: Oral Surgery;  Laterality: N/A;  . TUBAL LIGATION  04/1984     OB History    Gravida  2   Para  2   Term  2   Preterm      AB      Living        SAB      TAB      Ectopic      Multiple      Live Births              Family History  Problem Relation Age of Onset  . Dementia Mother   . Diabetes type II Mother   . Diabetes Mother   . Breast cancer Mother   . Heart attack Father  Social History   Tobacco Use  . Smoking status: Never Smoker  . Smokeless tobacco: Never Used  Vaping Use  . Vaping Use: Never used  Substance Use Topics  . Alcohol use: No  . Drug use: No  lives at home  Home Medications Prior to Admission medications   Medication Sig Start Date End Date Taking? Authorizing Provider  Alcohol Swabs (ALCOHOL PREP) 70 % PADS  10/30/17   [provider]  amitriptyline (ELAVIL) 100 MG tablet Take 100 mg by mouth at bedtime.  02/17/15   [provider]  AQUALANCE LANCETS 30G [place]  10/30/17   [provider]  aspirin 81 MG chewable tablet Chew 1 tablet (81 mg total) by mouth daily. 03/03/13   Rai, Vernelle Emerald, MD  atorvastatin (LIPITOR) 40 MG tablet Take 40 mg by mouth daily.  02/23/15   [provider]  Blood Glucose Calibration (ONETOUCH VERIO) SOLN  10/30/17   [provider]    botulinum toxin Type A (BOTOX) 100 units SOLR injection Inject 500 Units into the muscle every 3 (three) months. 12/30/19   Marcial Pacas, MD  CARAFATE 1 GM/10ML suspension  09/30/17   [provider]  Cholecalciferol (VITAMIN D) 1000 UNITS capsule Take 1,000 Units by mouth daily.    [provider]  DULoxetine (CYMBALTA) 60 MG capsule Take 60 mg by mouth daily.    [provider]  Erenumab-aooe (AIMOVIG) 70 MG/ML SOAJ Inject 70 mg into the skin every 30 (thirty) days. 08/19/19   Marcial Pacas, MD  esomeprazole (NEXIUM) 40 MG capsule Take 40 mg by mouth 2 (two) times daily.    [provider]  ketoconazole (NIZORAL) 2 % cream Apply 1 application topically 2 (two) times daily as needed for irritation.    [provider]  levETIRAcetam (KEPPRA) 750 MG tablet Take 1 tablet (750 mg total) by mouth 2 (two) times daily. 06/23/19   Marcial Pacas, MD  lidocaine (XYLOCAINE) 5 % ointment Apply 1 application topically 4 (four) times daily as needed for moderate pain.  08/21/17   [provider]  meloxicam (MOBIC) 15 MG tablet Take 15 mg by mouth daily.    [provider]  metFORMIN (GLUCOPHAGE) 500 MG tablet Take 500 mg by mouth daily. 09/04/17   [provider]  metoprolol succinate (TOPROL-XL) 25 MG 24 hr tablet Take 1 tablet (25 mg total) by mouth daily. 03/03/13   Rai, Ripudeep K, MD  ondansetron (ZOFRAN) 4 MG tablet Take one tab every 8 hours prn for nausea.  #20 per month. 10/14/19   Marcial Pacas, MD  [facility] VERIO test strip  10/30/17   [provider]  oxybutynin (DITROPAN-XL) 10 MG 24 hr tablet Take 10 mg by mouth 2 (two) times daily.    [provider]  oxyCODONE (ROXICODONE) 15 MG immediate release tablet Take 15 mg by mouth 4 (four) times daily as needed for pain.     [provider]  pregabalin (LYRICA) 75 MG capsule TAKE 1 CAPSULE BY MOUTH THREE TIMES DAILY 11/23/19   Marcial Pacas, MD  tiZANidine (ZANAFLEX) 2 MG  tablet TAKE 2 TABLETS BY MOUTH FOUR TIMES DAILY AS NEEDED FOR MUSCLE SPASMS 03/21/20   Marcial Pacas, MD    Allergies    Celebrex [celecoxib], Baclofen, Aspirin, and Zolpidem  Review of Systems   Review of Systems  Unable to perform ROS: Mental status change    Physical Exam Updated Vital Signs BP (!) 105/61   Pulse 102  Temp (!) 100.5 F (38.1 C) (Rectal)   Resp 14   Ht 5\' 1"  (1.549 m)   Wt 87 kg   LMP 09/30/2011   SpO2 100%   BMI 36.24 kg/m   Physical Exam Vitals and nursing note reviewed.  Constitutional:      Appearance: She is obese. She is not toxic-appearing.  HENT:     Head: Normocephalic and atraumatic.     Right Ear: External ear normal.     Left Ear: External ear normal.  Eyes:     Extraocular Movements: Extraocular movements intact.     Conjunctiva/sclera: Conjunctivae normal.     Pupils: Pupils are equal, round, and reactive to light.     Comments: Patient appears to be looking mildly to the right, she does open her eyes when I say her name however she does not make eye contact.  If I move she does not follow me.  Cardiovascular:     Rate and Rhythm: Regular rhythm. Tachycardia present.  Pulmonary:     Effort: Pulmonary effort is normal. No respiratory distress.     Breath sounds: Normal breath sounds.  Musculoskeletal:     Cervical back: Normal range of motion.     Comments: Patient is noted to have muscle atrophy of her left lower extremity compared to the right.  Skin:    General: Skin is warm and dry.     Findings: No erythema or rash.  Neurological:     Comments: Patient opens her eyes when I say her name she does not respond verbally.  She does not follow commands.  Psychiatric:     Comments: Unable to assess     ED Results / Procedures / Treatments   Labs (all labs ordered are listed, but only abnormal results are displayed) Results for orders placed or performed during the hospital encounter of 03/28/20  Comprehensive metabolic panel   Result Value Ref Range   Sodium 135 135 - 145 mmol/L   Potassium 4.0 3.5 - 5.1 mmol/L   Chloride 105 98 - 111 mmol/L   CO2 16 (L) 22 - 32 mmol/L   Glucose, Bld 77 70 - 99 mg/dL   BUN 9 8 - 23 mg/dL   Creatinine, Ser 0.38 (L) 0.44 - 1.00 mg/dL   Calcium 7.0 (L) 8.9 - 10.3 mg/dL   Total Protein 3.1 (L) 6.5 - 8.1 g/dL   Albumin 1.5 (L) 3.5 - 5.0 g/dL   AST 13 (L) 15 - 41 U/L   ALT 17 0 - 44 U/L   Alkaline Phosphatase 49 38 - 126 U/L   Total Bilirubin 0.6 0.3 - 1.2 mg/dL   GFR calc non Af Amer >60 >60 mL/min   GFR calc Af Amer >60 >60 mL/min   Anion gap 14 5 - 15  Lactic acid, plasma  Result Value Ref Range   Lactic Acid, Venous >11.0 (HH) 0.5 - 1.9 mmol/L  CBC with Differential  Result Value Ref Range   WBC 9.6 4.0 - 10.5 K/uL   RBC 2.46 (L) 3.87 - 5.11 MIL/uL   Hemoglobin 6.0 (LL) 12.0 - 15.0 g/dL   HCT 21.4 (L) 36 - 46 %   MCV 87.0 80.0 - 100.0 fL   MCH 24.4 (L) 26.0 - 34.0 pg   MCHC 28.0 (L) 30.0 - 36.0 g/dL   RDW 17.5 (H) 11.5 - 15.5 %   Platelets 144 (L) 150 - 400 K/uL   nRBC 0.0 0.0 - 0.2 %   Neutrophils  Relative % 85 %   Neutro Abs 8.2 (H) 1.7 - 7.7 K/uL   Lymphocytes Relative 8 %   Lymphs Abs 0.8 0.7 - 4.0 K/uL   Monocytes Relative 6 %   Monocytes Absolute 0.6 0 - 1 K/uL   Eosinophils Relative 1 %   Eosinophils Absolute 0.1 0 - 0 K/uL   Basophils Relative 0 %   Basophils Absolute 0.0 0 - 0 K/uL   Immature Granulocytes 0 %   Abs Immature Granulocytes 0.04 0.00 - 0.07 K/uL  Protime-INR  Result Value Ref Range   Prothrombin Time 18.0 (H) 11.4 - 15.2 seconds   INR 1.6 (H) 0.8 - 1.2  Urinalysis, Routine w reflex microscopic  Result Value Ref Range   Color, Urine YELLOW YELLOW   APPearance CLEAR CLEAR   Specific Gravity, Urine 1.017 1.005 - 1.030   pH 5.0 5.0 - 8.0   Glucose, UA NEGATIVE NEGATIVE mg/dL   Hgb urine dipstick NEGATIVE NEGATIVE   Bilirubin Urine NEGATIVE NEGATIVE   Ketones, ur NEGATIVE NEGATIVE mg/dL   Protein, ur NEGATIVE NEGATIVE mg/dL    Nitrite NEGATIVE NEGATIVE   Leukocytes,Ua NEGATIVE NEGATIVE  APTT  Result Value Ref Range   aPTT 41 (H) 24 - 36 seconds  Brain natriuretic peptide  Result Value Ref Range   B Natriuretic Peptide 42.1 0.0 - 100.0 pg/mL  Rapid urine drug screen (hospital performed)  Result Value Ref Range   Opiates POSITIVE (A) NONE DETECTED   Cocaine NONE DETECTED NONE DETECTED   Benzodiazepines NONE DETECTED NONE DETECTED   Amphetamines NONE DETECTED NONE DETECTED   Tetrahydrocannabinol NONE DETECTED NONE DETECTED   Barbiturates NONE DETECTED NONE DETECTED  Troponin I (High Sensitivity)  Result Value Ref Range   Troponin I (High Sensitivity) 3 <18 ng/L   Laboratory interpretation all normal except malnutrition, new anemia, positive UDS however patient is prescribed oxycodone,    EKG EKG Interpretation  Date/Time:  Monday March 28 2020 00:30:09 EDT Ventricular Rate:  111 PR Interval:    QRS Duration: 83 QT Interval:  339 QTC Calculation: 461 R Axis:   19 Text Interpretation: Sinus tachycardia Low voltage, precordial leads Abnormal R-wave progression, early transition Borderline T abnormalities, anterior leads Since last tracing rate faster 13 Sep 2017 Confirmed by Rolland Porter [phone]) on 03/28/2020 12:39:16 AM   Radiology DG Chest 2 View  Result Date: 03/28/2020 CLINICAL DATA:  Suspected sepsis EXAM: CHEST - 2 VIEW COMPARISON:  02/26/2013 FINDINGS: Cardiac shadow is stable. Mild vascular congestion is noted. The overall inspiratory effort is poor. No focal infiltrate is seen. No bony abnormality is noted. IMPRESSION: Mild vascular congestion. Electronically Signed   By: Inez Catalina M.D.   On: 03/28/2020 01:30   CT Head Wo Contrast  Result Date: 03/28/2020 CLINICAL DATA:  Mental status change.  Unknown cause. EXAM: CT HEAD WITHOUT CONTRAST TECHNIQUE: Contiguous axial images were obtained from the base of the skull through the vertex without intravenous contrast. COMPARISON:  CT dated February 21, 2013. FINDINGS: Brain: No hemorrhage. No extraaxial collection.No midline shift. There is ex vacuo dilatation of the right lateral ventricle. The basal cisterns are unremarkable. Enlarged old MCA territory infarct is noted on the patient's right. There is Wallerian degeneration. The cerebellum is unremarkable. The sella is unremarkable. Again noted is an extra-axial para falcine mass posteriorly measuring approximately 1.2 cm, stable from 2014. There is a stable amount of surrounding vasogenic edema. Vascular: No hyperdense vessel or unexpected calcification. Skull: The patient is status post  prior right frontoparietal craniotomy. The skull base is unremarkable. The visualized upper cervical spine is unremarkable. Sinuses/Orbits: The visualized orbits are unremarkable. The paranasal sinuses are unremarkable. The mastoid air cells are clear. Other: The visualized parotid gland is unremarkable. There is no scalp soft tissue swelling.  Comes at electronically Signed   By: Constance Holster M.D.   On: 03/28/2020 01:49    Procedures Procedure Name: Intubation Date/Time: 03/28/2020 3:48 AM Performed by: Rolland Porter, MD Pre-anesthesia Checklist: Patient identified, Patient being monitored, Emergency Drugs available, Timeout performed and Suction available Oxygen Delivery Method: Non-rebreather mask Preoxygenation: Pre-oxygenation with 100% oxygen Induction Type: Rapid sequence Ventilation: Mask ventilation without difficulty Laryngoscope Size: Glidescope and 4 Grade View: Grade I Tube type: Non-subglottic suction tube Tube size: 7.5 mm Number of attempts: 2 Placement Confirmation: ETT inserted through vocal cords under direct vision,  CO2 detector and Breath sounds checked- equal and bilateral Tube secured with: ETT holder Dental Injury: Bloody posterior oropharynx  Comments: Patient was already sedated naturally, she was not given sedation prior to intubation.  Due to her prior history of stroke she was  given rocuronium 80 mg IV.  She rapidly lost spontaneous respirations.  I first tried to intubate her with a #3 blade however I could not get the tube to go around the arytenoid folds.  I also noted she had coffee-ground material in her upper airway.  I then attempted intubation with a #4 blade and was easily able to pass the tube between the cords.  She had positive CO2 change and good breath sounds however she did start dropping her pulse ox into the 70s.  The tube was repositioned from 24 cm to 22 cm and her pulse ox improved into the high 90s.  OG was placed and post procedure chest x-ray was ordered.     .Critical Care Performed by: Rolland Porter, MD Authorized by: Rolland Porter, MD   Critical care provider statement:    Critical care time (minutes):  32   Critical care was necessary to treat or prevent imminent or life-threatening deterioration of the following conditions:  Sepsis   Critical care was time spent personally by me on the following activities:  Discussions with consultants, examination of patient, obtaining history from patient or surrogate, ordering and review of laboratory studies, ordering and review of radiographic studies, pulse oximetry, re-evaluation of patient's condition and review of old charts   (including critical care time)  Medications Ordered in ED Medications  sodium chloride flush (NS) 0.9 % injection 10-40 mL (10 mLs Intracatheter Given 03/28/20 0237)  Chlorhexidine Gluconate Cloth 2 % PADS 6 each (has no administration in time range)  vancomycin (VANCOREADY) IVPB 1500 mg/300 mL (1,500 mg Intravenous New Bag/Given 03/28/20 0436)  pantoprazole (PROTONIX) 80 mg in sodium chloride 0.9 % 100 mL IVPB (80 mg Intravenous New Bag/Given 03/28/20 0449)  pantoprazole (PROTONIX) 80 mg in sodium chloride 0.9 % 100 mL (0.8 mg/mL) infusion (has no administration in time range)  pantoprazole (PROTONIX) injection 40 mg (has no administration in time range)  acetaminophen (TYLENOL)  suppository 650 mg (650 mg Rectal Given 03/28/20 0104)  lactated ringers bolus 1,000 mL (0 mLs Intravenous Stopped 03/28/20 0200)    And  lactated ringers bolus 500 mL (0 mLs Intravenous Stopped 03/28/20 0300)  ceFEPIme (MAXIPIME) 2 g in sodium chloride 0.9 % 100 mL IVPB ( Intravenous Stopped 03/28/20 0420)  metroNIDAZOLE (FLAGYL) IVPB 500 mg ( Intravenous Rate/Dose Verify 03/28/20 0421)  lactated ringers bolus 1,000 mL (1,000  mLs Intravenous New Bag/Given 03/28/20 0427)  rocuronium bromide 100 MG/10ML SOSY (100 mg  Given 03/28/20 0341)    ED Course  I have reviewed the triage vital signs and the nursing notes.  Pertinent labs & imaging results that were available during my care of the patient were reviewed by me and considered in my medical decision making (see chart for details).    MDM Rules/Calculators/A&P                          Patient was given Tylenol suppository since she is not able to take anything orally due to altered mental status.  She was started on sepsis protocol and received 1500 cc lactated Ringer's based on BMI greater than 35 kg's.  Portable chest x-ray was ordered as well as head CT.  She had been describing increasing headaches to her neurologist.  She has a prior history of an aneurysm causing her left-sided hemiparesis.  Other sources of fever were entertained such as urinary tract infection.  She was not given Narcan due to her prior history of seizures.  3:00 AM I was hoping maybe patient had been post ictal when she first arrived because she has a history of seizures however she still does not respond verbally and now she does not open her eyes to verbal commands.  Her fever was treated with acetaminophen rectally and her temperature has improved and again she has not become more alert.  At this point I feel like she needs to be intubated to protect her airway.  Her urinalysis is normal so source of her fever is not clear.  She does not appear to have a infiltrate on her  chest x-ray.  Head CT is without acute changes.  3:00 AM patient's urine came back without signs of UTI, she was started on antibiotics for unknown source.  3:30 AM son was in the room.  He states patient called him at 7:00 as he got off work to ask for help getting out of bed.  He states that she ate dinner and seemed her usual self when he left at 9 PM.  By 11 PM he states his nephew who stays with her had called EMS.  We discussed the need for intubation.  When son talked to the patient she did not respond to him.  He is in agreement.  We also discussed she was anemic and would need a blood transfusion and he was agreeable.  We discussed the results of her tests and that currently the source of her fever is unknown.    While intubated patient I saw some coffee-ground type material in her posterior pharynx.  She was started on a Protonix bolus and drip.  After reviewing her elevated lactic acid she was given an additional liter of lactated Ringer's.  4:53 AM Dr. Madalyn Rob, pulmonary critical care, states he will have the admitting team see her.  6:40 AM patient is suddenly awake and trying to cough her ET tube out.  She was given propofol bolus and drip for sedation.  Final Clinical Impression(s) / ED Diagnoses Final diagnoses:  Stupor  Fever, unspecified fever cause  Lactic acidosis  Anemia, unspecified type  UGI bleed    Rx / DC Orders  Plan admission  Rolland Porter, MD, Barbette Or, MD 03/28/20 0500    Rolland Porter, MD 03/28/20 [phone]

## 2020-03-28 NOTE — Progress Notes (Signed)
Consent obtained from son via telephone for PICC.

## 2020-03-28 NOTE — ED Triage Notes (Signed)
Pt presents from home with altered mental status and hypoxia. Pt was last seen normal at 1400. Fire on scene had O2 saturation at 50%. Pt on 15L via NRB with O2 saturation of 98%. EMS reports pt having prior hx of stroke with left sided defecates.

## 2020-03-28 NOTE — Consult Note (Signed)
Referring Provider: ED Primary Care Physician:  Mayra Neer, MD Primary Gastroenterologist:  Dr. Watt Climes [facility])  Reason for Consultation:  Coffee ground emesis, anemia  HPI: Jordan Rivas is a 59 y.o. female with history of CVA (2003), DM type 2, seizures, and hiatal hernia presenting with upper GI bleeding.  Patient was brought in to the ED this morning with coffee-ground emesis, AMS, fever, and hypoxia.  At that time, coffee-ground emesis was noted in the posterior pharynx, and hemoglobin of 6.0 on arrival.  Per chart review, it appears that family stated patient was in her usual state of health as of last night but then was found unresponsive this morning.  Outpatient medication list includes meloxicam, which patient's daughter states patient takes once daily.  Aspirin 81 mg also listed.  Patient also takes Nexium daily.   Patient currently intubated and sedated and thus is unable to give any subjective information.   EGD 09/25/2017: Small hiatal hernia, benign-appearing esophageal stenosis 01/2009: colonoscopy showed internal hemorrhoids, otherwise normal  Past Medical History:  Diagnosis Date   Chronic pain    "in this left arm and leg that don't move"   CVA (cerebrovascular accident) ([place]) 1995   "left side paralyzed"   Dental caries    Depression    Diabetes mellitus without complication ([place])    GERD (gastroesophageal reflux disease)    Headache    Hypercholesteremia    Hypertension    Insomnia    Neuropathy    Overactive bladder    Peripheral vascular disease ([place])    left arm and leg; since stroke 1995   Pneumonia    Seizures ([place]) 01/27/12   "first one ever"    Past Surgical History:  Procedure Laterality Date   CEREBRAL ANEURYSM REPAIR  11/1993   "left my left side paralyzed"   DENTAL RESTORATION/EXTRACTION WITH X-RAY N/A 09/13/2017   Procedure: DENTAL RESTORATION/EXTRACTION;  Surgeon: Diona Browner, DDS;  Location: [facility];  Service: Oral  Surgery;  Laterality: N/A;   TUBAL LIGATION  04/1984    Prior to Admission medications   Medication Sig Start Date End Date Taking? Authorizing Provider  Alcohol Swabs (ALCOHOL PREP) 70 % PADS  10/30/17   [provider]  amitriptyline (ELAVIL) 100 MG tablet Take 100 mg by mouth at bedtime.  02/17/15   [provider]  AQUALANCE LANCETS 30G [place]  10/30/17   [provider]  aspirin 81 MG chewable tablet Chew 1 tablet (81 mg total) by mouth daily. 03/03/13   Rai, Vernelle Emerald, MD  atorvastatin (LIPITOR) 40 MG tablet Take 40 mg by mouth daily.  02/23/15   [provider]  atorvastatin (LIPITOR) 80 MG tablet Take 80 mg by mouth daily. 02/29/20   [provider]  Blood Glucose Calibration (ONETOUCH VERIO) SOLN  10/30/17   [provider]  botulinum toxin Type A (BOTOX) 100 units SOLR injection Inject 500 Units into the muscle every 3 (three) months. 12/30/19   Marcial Pacas, MD  CARAFATE 1 GM/10ML suspension  09/30/17   [provider]  Cholecalciferol (VITAMIN D) 1000 UNITS capsule Take 1,000 Units by mouth daily.    [provider]  DULoxetine (CYMBALTA) 60 MG capsule Take 60 mg by mouth daily.    [provider]  Erenumab-aooe (AIMOVIG) 70 MG/ML SOAJ Inject 70 mg into the skin every 30 (thirty) days. 08/19/19   Marcial Pacas, MD  esomeprazole (NEXIUM) 40 MG capsule Take 40 mg by mouth 2 (two) times daily.  [provider]  glimepiride (AMARYL) 2 MG tablet Take 2 mg by mouth every morning. 01/28/20   [provider]  ketoconazole (NIZORAL) 2 % cream Apply 1 application topically 2 (two) times daily as needed for irritation.    [provider]  levETIRAcetam (KEPPRA) 750 MG tablet Take 1 tablet (750 mg total) by mouth 2 (two) times daily. 06/23/19   Marcial Pacas, MD  lidocaine (XYLOCAINE) 5 % ointment Apply 1 application topically 4 (four) times daily as needed for moderate pain.  08/21/17   [provider]  meloxicam (MOBIC) 15 MG tablet Take 15 mg by mouth daily.    [provider]  metFORMIN (GLUCOPHAGE) 500 MG tablet Take 500 mg by mouth daily. 09/04/17   [provider]  metoprolol succinate (TOPROL-XL) 25 MG 24 hr tablet Take 1 tablet (25 mg total) by mouth daily. 03/03/13   Rai, Ripudeep K, MD  ondansetron (ZOFRAN) 4 MG tablet Take one tab every 8 hours prn for nausea.  #20 per month. 10/14/19   Marcial Pacas, MD  [facility] VERIO test strip  10/30/17   [provider]  oxybutynin (DITROPAN-XL) 10 MG 24 hr tablet Take 10 mg by mouth 2 (two) times daily.    [provider]  oxyCODONE (ROXICODONE) 15 MG immediate release tablet Take 15 mg by mouth 4 (four) times daily as needed for pain.     [provider]  pregabalin (LYRICA) 75 MG capsule TAKE 1 CAPSULE BY MOUTH THREE TIMES DAILY 11/23/19   Marcial Pacas, MD  tiZANidine (ZANAFLEX) 2 MG tablet TAKE 2 TABLETS BY MOUTH FOUR TIMES DAILY AS NEEDED FOR MUSCLE SPASMS 03/21/20   Marcial Pacas, MD    Scheduled Meds:  amitriptyline  100 mg Oral QHS   chlorhexidine gluconate (MEDLINE KIT)  15 mL Mouth Rinse BID   Chlorhexidine Gluconate Cloth  6 each Topical Daily   insulin aspart  0-15 Units Subcutaneous Q4H   mouth rinse  15 mL Mouth Rinse 10 times per day   [START ON 03/31/2020] pantoprazole  40 mg Intravenous Q12H   sodium chloride flush  10-40 mL Intracatheter Q12H   Continuous Infusions:  ceFEPime (MAXIPIME) IV     lactated ringers     levETIRAcetam     metronidazole     pantoprozole (PROTONIX) infusion 8 mg/hr (03/28/20 0516)   propofol (DIPRIVAN) infusion 25 mcg/kg/min (03/28/20 0720)   PRN Meds:.docusate sodium, polyethylene glycol  Allergies as of 03/27/2020 - Review Complete 10/14/2019  Allergen Reaction Noted   Celebrex [celecoxib] Swelling and Other (See Comments) 01/28/2012   Baclofen Swelling 03/02/2013   Aspirin  01/03/2018   Zolpidem  01/03/2018    Family  History  Problem Relation Age of Onset   Dementia Mother    Diabetes type II Mother    Diabetes Mother    Breast cancer Mother    Heart attack Father     Social History   Socioeconomic History   Marital status: Divorced    Spouse name: Not on file   Number of children: 2   Years of education: 12   Highest education level: High school graduate  Occupational History   Occupation: Disabled  Tobacco Use   Smoking status: Never Smoker   Smokeless tobacco: Never Used  Scientific laboratory technician Use: Never used  Substance and Sexual Activity   Alcohol use: No   Drug use: No   Sexual activity: Yes  Other Topics Concern   Not on file  Social History Narrative   Lives at home with her grandson.   Right-handed.   No daily caffeine use.   Social Determinants of Health   Financial Resource Strain:    Difficulty of Paying Living Expenses:   Food Insecurity:    Worried About Charity fundraiser in the Last Year:    Arboriculturist in the Last Year:   Transportation Needs:    Film/video editor (Medical):    Lack of Transportation (Non-Medical):   Physical Activity:    Days of Exercise per Week:    Minutes of Exercise per Session:   Stress:    Feeling of Stress :   Social Connections:    Frequency of Communication with Friends and Family:    Frequency of Social Gatherings with Friends and Family:    Attends Religious Services:    Active Member of Clubs or Organizations:    Attends Music therapist:    Marital Status:   Intimate Partner Violence:    Fear of Current or Ex-Partner:    Emotionally Abused:    Physically Abused:    Sexually Abused:     Review of Systems: Unable to obtain as patient is intubated and sedated.  Physical Exam: Vital signs: Vitals:   03/28/20 0900 03/28/20 1005  BP: 118/66   Pulse: 91   Resp: 16   Temp:    SpO2: 100% 100%     Physical Exam Vitals and nursing note reviewed.   Constitutional:      Interventions: She is sedated and intubated.     Comments: OG tube in place; son and daughter at bedside  HENT:     Head: Normocephalic and atraumatic.     Nose: Nose normal.     Mouth/Throat:     Mouth: Mucous membranes are moist.     Pharynx: Oropharynx is clear.  Eyes:     General: No scleral icterus.    Comments: Conjunctival pallor  Cardiovascular:     Rate and Rhythm: Normal rate and regular rhythm.     Pulses: Normal pulses.     Heart sounds: Normal heart sounds.  Pulmonary:     Effort: No respiratory distress. She is intubated.  Abdominal:     General: Bowel sounds are normal. There is no distension.     Palpations: Abdomen is soft. There is no mass.     Tenderness: There is no abdominal tenderness. There is no guarding or rebound.     Hernia: No hernia is present.  Musculoskeletal:        General: No swelling or tenderness.     Cervical back: Normal range of motion and neck supple.  Skin:    General: Skin is warm and dry.     Coloration: Skin is pale.  Neurological:     Comments: Intubated and sedated - does not respond to voice or touch  Psychiatric:     Comments: Unable to assess: intubated and sedated     GI:  Lab Results: Recent Labs    03/28/20 0220  WBC 9.6  HGB 6.0*  HCT 21.4*  PLT 144*   BMET Recent Labs    03/28/20 0220 03/28/20 0837  NA 135  --   K 4.0  --   CL 105  --   CO2 16*  --   GLUCOSE 77  --   BUN 9  --   CREATININE 0.38* 0.68  CALCIUM 7.0*  --    LFT Recent Labs  03/28/20 0220  PROT 3.1*  ALBUMIN 1.5*  AST 13*  ALT 17  ALKPHOS 49  BILITOT 0.6   PT/INR Recent Labs    03/28/20 0220  LABPROT 18.0*  INR 1.6*     Studies/Results: DG Chest 2 View  Result Date: 03/28/2020 CLINICAL DATA:  Suspected sepsis EXAM: CHEST - 2 VIEW COMPARISON:  02/26/2013 FINDINGS: Cardiac shadow is stable. Mild vascular congestion is noted. The overall inspiratory effort is poor. No focal infiltrate is seen. No  bony abnormality is noted. IMPRESSION: Mild vascular congestion. Electronically Signed   By: Inez Catalina M.D.   On: 03/28/2020 01:30   DG Abdomen 1 View  Result Date: 03/28/2020 CLINICAL DATA:  Intubation and nasogastric tube placement EXAM: ABDOMEN - 1 VIEW COMPARISON:  None similar FINDINGS: The nasogastric tube tip overlaps the stomach. Diffuse desiccated stool. Nonobstructive bowel gas pattern. No concerning mass effect or gas collection. IMPRESSION: 1. The enteric tube tip is at the stomach. 2. Desiccated stool retention. Electronically Signed   By: Monte Fantasia M.D.   On: 03/28/2020 04:32   CT Head Wo Contrast  Result Date: 03/28/2020 CLINICAL DATA:  Mental status change.  Unknown cause. EXAM: CT HEAD WITHOUT CONTRAST TECHNIQUE: Contiguous axial images were obtained from the base of the skull through the vertex without intravenous contrast. COMPARISON:  CT dated February 21, 2013. FINDINGS: Brain: No hemorrhage. No extraaxial collection.No midline shift. There is ex vacuo dilatation of the right lateral ventricle. The basal cisterns are unremarkable. Enlarged old MCA territory infarct is noted on the patient's right. There is Wallerian degeneration. The cerebellum is unremarkable. The sella is unremarkable. Again noted is an extra-axial para falcine mass posteriorly measuring approximately 1.2 cm, stable from 2014. There is a stable amount of surrounding vasogenic edema. Vascular: No hyperdense vessel or unexpected calcification. Skull: The patient is status post prior right frontoparietal craniotomy. The skull base is unremarkable. The visualized upper cervical spine is unremarkable. Sinuses/Orbits: The visualized orbits are unremarkable. The paranasal sinuses are unremarkable. The mastoid air cells are clear. Other: The visualized parotid gland is unremarkable. There is no scalp soft tissue swelling. IMPRESSION: 1. No acute intracranial abnormality. 2. Old right MCA territory infarct. 3. Stable  extra-axial para falcine mass, consistent with a meningioma. Electronically Signed   By: Constance Holster M.D.   On: 03/28/2020 01:49   CT ANGIO CHEST PE W OR WO CONTRAST  Result Date: 03/28/2020 CLINICAL DATA:  PE suspected altered mental status, fevers, hypoxia, intubated, coffee-ground emesis noted EXAM: CT ANGIOGRAPHY CHEST WITH CONTRAST TECHNIQUE: Multidetector CT imaging of the chest was performed using the standard protocol during bolus administration of intravenous contrast. Multiplanar CT image reconstructions and MIPs were obtained to evaluate the vascular anatomy. CONTRAST:  193m OMNIPAQUE IOHEXOL 350 MG/ML SOLN COMPARISON:  None. FINDINGS: Cardiovascular: Examination for pulmonary embolism is somewhat limited by motion artifact. Within this limitation, no evidence of pulmonary embolism through the proximal segmental pulmonary arterial level. Normal heart size. No pericardial effusion. Mediastinum/Nodes: No enlarged mediastinal, hilar, or axillary lymph nodes. Endotracheal intubation. Thyroid gland is normal. Frothy debris in the upper esophagus. Lungs/Pleura: Very dense consolidations and or atelectasis of the dependent bilateral lower lobes, with additional consolidation of the right middle lobe and lingula, and scattered heterogeneous and ground-glass airspace opacities elsewhere. Small bilateral pleural effusions. Upper Abdomen: Please see separately reported CT of the abdomen and pelvis. Musculoskeletal: No chest wall abnormality. No acute or significant osseous findings. Review of the MIP images confirms the above findings.  IMPRESSION: 1. Examination for pulmonary embolism is somewhat limited by motion artifact. Within this limitation, no evidence of pulmonary embolism through the proximal segmental pulmonary arterial level. 2. Very dense consolidations and or atelectasis of the dependent bilateral lower lobes, with additional consolidation of the right middle lobe and lingula, and scattered  heterogeneous and ground-glass airspace opacities elsewhere. Findings are consistent with multifocal infection and/or aspiration. 3. Small associated bilateral pleural effusions. 4. Endotracheal intubation. Electronically Signed   By: Eddie Candle M.D.   On: 03/28/2020 09:52   CT ABDOMEN PELVIS W CONTRAST  Result Date: 03/28/2020 CLINICAL DATA:  Altered mental status, fevers, hypoxia, coffee-ground emesis, hemoglobin of 6 EXAM: CT ABDOMEN AND PELVIS WITH CONTRAST TECHNIQUE: Multidetector CT imaging of the abdomen and pelvis was performed using the standard protocol following bolus administration of intravenous contrast. CONTRAST:  135m OMNIPAQUE IOHEXOL 350 MG/ML SOLN COMPARISON:  CT abdomen pelvis, 11/03/2010 FINDINGS: Lower chest: No acute abnormality. Hepatobiliary: No solid liver abnormality is seen. No gallstones, gallbladder wall thickening, or biliary dilatation. Pancreas: Unremarkable. No pancreatic ductal dilatation or surrounding inflammatory changes. Spleen: Normal in size without significant abnormality. Adrenals/Urinary Tract: Adrenal glands are unremarkable. Kidneys are normal, without renal calculi, solid lesion, or hydronephrosis. Foley catheter in the urinary bladder. Stomach/Bowel: Esophagogastric tube with tip and side port below the diaphragm. Appearance of the pylorus suggests mucosal edema and ulceration (series 2, image 30, series 4, image 29). Appendix appears normal. No evidence of bowel wall thickening, distention, or inflammatory changes. Moderate burden of stool and stool balls throughout the colon. Vascular/Lymphatic: No significant vascular findings are present. No enlarged abdominal or pelvic lymph nodes. Reproductive: Uterine fibroids. Other: No abdominal wall hernia or abnormality. No abdominopelvic ascites. Musculoskeletal: No acute or significant osseous findings. IMPRESSION: 1. Appearance of the pylorus suggests mucosal edema and ulceration. Consider direct endoscopic  visualization to evaluate for bleeding peptic ulcer in the setting of coffee-ground emesis and anemia. 2. Esophagogastric tube with tip and side port below the diaphragm. 3. Moderate burden of stool and stool balls throughout the colon. 4. Uterine fibroids. Electronically Signed   By: AEddie CandleM.D.   On: 03/28/2020 10:06   DG Chest Portable 1 View  Result Date: 03/28/2020 CLINICAL DATA:  Intubation EXAM: PORTABLE CHEST 1 VIEW COMPARISON:  03/28/2020 FINDINGS: Endotracheal tube with tip at the clavicular heads. The enteric tube reaches the stomach. Low volume chest with streaky density on both sides. No edema, effusion, or pneumothorax. Borderline heart size. IMPRESSION: 1. Unremarkable hardware positioning. 2. Low volume chest with perihilar atelectasis or infiltrates. Electronically Signed   By: JMonte FantasiaM.D.   On: 03/28/2020 04:34    Impression: Upper GI Bleeding -Hgb 6.0 on arrival, now 10.0 after 1u pRBCs -CT 03/28/20: Appearance of the pylorus suggests mucosal edema and ulceration -BUN within normal limits this morning with Cr 0.38  Altered mental status -No acute intracranial abnormality on CT 03/28/20 -EEG pending -UDS positive for opiates  Lactic acidosis  Moderate burden of stool and stool balls throughout the colon  Plan: Bedside EGD today with Dr. MWatt Climes  Dr. MWatt Climesand I thoroughly discussed the procedure with the patient's son, RKermit Balo to include nature, alternatives, benefits, and risks (including but not limited to bleeding, infection, perforation).  Patient's son RKermit Baloverbalized understanding and gave both verbal written and consent.  Continue to monitor H&H with transfusion as needed to maintain Hgb >7.  Continue Protonix drip.  [facility] will follow.   LOS: 0 days  Salley Slaughter  PA-C 03/28/2020, 10:16 AM  Contact #  [phone]

## 2020-03-28 NOTE — Op Note (Signed)
[facility] Patient Name: Jordan Rivas Procedure Date: 03/28/2020 MRN: [id number] Attending MD: Clarene Essex , MD Date of Birth: 03-Apr-1961 CSN: [id number] Age: 59 Admit Type: Inpatient Procedure:                Upper GI endoscopy Indications:              Hematemesis Providers:                Clarene Essex, MD, Cleda Daub, RN, Carlyn Reichert, RN Referring MD:              Medicines:                Propofol total dose On drip per ICU routine Complications:            No immediate complications. Estimated Blood Loss:     Estimated blood loss: none. Procedure:                Pre-Anesthesia Assessment:                           - Prior to the procedure, a History and Physical                            was performed, and patient medications and                            allergies were reviewed. The patient's tolerance of                            previous anesthesia was also reviewed. The risks                            and benefits of the procedure and the sedation                            options and risks were discussed with the patient.                            All questions were answered, and informed consent                            was obtained. Prior Anticoagulants: The patient has                            taken no previous anticoagulant or antiplatelet                            agents. ASA Grade Assessment: III - A patient with                            severe systemic disease. After reviewing the risks                            and benefits, the patient was deemed in  satisfactory condition to undergo the procedure.                           After obtaining informed consent, the endoscope was                            passed under direct vision. Throughout the                            procedure, the patient's blood pressure, pulse, and                            oxygen saturations were monitored continuously. The                             GIF-H190 ([id number]) Olympus gastroscope was                            introduced through the mouth, and advanced to the                            third part of duodenum. The upper GI endoscopy was                            accomplished without difficulty. The patient                            tolerated the procedure well. Scope In: Scope Out: Findings:      A small hiatal hernia was present.      Patchy mild inflammation characterized by erythema was found in the       gastric body.      A medium amount of food (residue) was found in the gastric fundus, on       the greater curvature of the stomach and on the lesser curvature of the       stomach.      The duodenal bulb, first portion of the duodenum, second portion of the       duodenum and third portion of the duodenum were normal.      The exam was otherwise without abnormality without any blood being seen. Impression:               - Small hiatal hernia.                           - Chronic gastritis.                           - A medium amount of food (residue) in the stomach.                           - Normal duodenal bulb, first portion of the                            duodenum, second portion of the duodenum and third  portion of the duodenum.                           - The examination was otherwise normal.                           - No specimens collected. Moderate Sedation:      Not Applicable - Patient had care per Anesthesia. Recommendation:           - Continue present medications. Continue pump                            inhibitors when extubated slowly advance diet per                            ICU team                           - Return to GI clinic PRN. Please let us know if we                            could be of any further assistance with this                            hospital stay                           - Telephone GI clinic if symptomatic PRN. Procedure  Code(s):        --- Professional ---                           [phone], Esophagogastroduodenoscopy, flexible,                            transoral; diagnostic, including collection of                            specimen(s) by brushing or washing, when performed                            (separate procedure) Diagnosis Code(s):        --- Professional ---                           K44.9, Diaphragmatic hernia without obstruction or                            gangrene                           K29.50, Unspecified chronic gastritis without                            bleeding                           K92.0, Hematemesis CPT copyright 2019 [employer]. All rights  reserved. The codes documented in this report are preliminary and upon coder review may  be revised to meet current compliance requirements. Clarene Essex, MD 03/28/2020 3:43:54 PM This report has been signed electronically. Number of Addenda: 0

## 2020-03-28 NOTE — ED Notes (Signed)
IV access attempted x3 using IV ultrasound without success. Dr. Tomi Bamberger notified and IV team consult placed.

## 2020-03-28 NOTE — ED Notes (Signed)
IV team at bedside 

## 2020-03-28 NOTE — Progress Notes (Signed)
A consult was received from an ED physician for Vancomycin & Cefepime per pharmacy dosing.  The patient's profile has been reviewed for ht/wt/allergies/indication/available labs.   A one time order has been placed for Cefepime 2gm & Vancomycin 1500mg  IV.  Further antibiotics/pharmacy consults should be ordered by admitting physician if indicated.                       Thank you, Netta Cedars PharmD 03/28/2020  3:12 AM

## 2020-03-28 NOTE — Progress Notes (Addendum)
Pharmacy Antibiotic Note  Jordan Rivas is a 59 y.o. female admitted on 03/28/2020 with fever/lactic acidosis. Pharmacy has been consulted for empiric Vancomycin and Cefepime dosing.  Plan: Vancomycin 1500mg  IV x 1 given in the ED. Continue with Vancomycin 1g IV q12h Vancomycin trough level at steady state, as indicated Cefepime 2g IV q8h Metronidazole 500mg  IV q8h per MD Monitor renal function, cultures, clinical course  Height: 5\' 1"  (154.9 cm) Weight: 87 kg (191 lb 12.8 oz) IBW/kg (Calculated) : 47.8  Temp (24hrs), Avg:99.8 F (37.7 C), Min:99 F (37.2 C), Max:102.2 F (39 C)  Recent Labs  Lab 03/28/20 0220 03/28/20 0430  WBC 9.6  --   CREATININE 0.38*  --   LATICACIDVEN >11.0* 6.1*    Estimated Creatinine Clearance: 74 mL/min (A) (by C-G formula based on SCr of 0.38 mg/dL (L)).    Allergies  Allergen Reactions  . Celebrex [Celecoxib] Swelling and Other (See Comments)    "lips; it was terrible"  . Baclofen Swelling  . Aspirin   . Zolpidem     Antimicrobials this admission: 7/26 Vancomycin >> 7/26 Cefepime >> 7/26 Metronidazole >>  Dose adjustments this admission: --  Microbiology results: 7/26 BCx: sent 7/26 UCx: sent  7/26 Trach aspirate: ordered  7/26 MRSA PCR: sent  7/26 COVID: negative  7/26 HIV antibody: IP   Thank you for allowing pharmacy to be a part of this patient's care.   Lindell Spar, PharmD, BCPS Clinical Pharmacist  03/28/2020 9:01 AM

## 2020-03-29 ENCOUNTER — Inpatient Hospital Stay (HOSPITAL_COMMUNITY): Payer: Medicare (Managed Care)

## 2020-03-29 ENCOUNTER — Encounter (HOSPITAL_COMMUNITY): Payer: Self-pay | Admitting: Gastroenterology

## 2020-03-29 LAB — BLOOD GAS, ARTERIAL
Acid-Base Excess: 4.4 mmol/L — ABNORMAL HIGH (ref 0.0–2.0)
Bicarbonate: 28.7 mmol/L — ABNORMAL HIGH (ref 20.0–28.0)
FIO2: 40
O2 Saturation: 95.3 %
Patient temperature: 99.1
pCO2 arterial: 44.1 mmHg (ref 32.0–48.0)
pH, Arterial: 7.43 (ref 7.350–7.450)
pO2, Arterial: 80.6 mmHg — ABNORMAL LOW (ref 83.0–108.0)

## 2020-03-29 LAB — TRIGLYCERIDES: Triglycerides: 165 mg/dL — ABNORMAL HIGH (ref <150)

## 2020-03-29 LAB — BASIC METABOLIC PANEL
Anion gap: 10 (ref 5–15)
BUN: 6 mg/dL — ABNORMAL LOW (ref 8–23)
CO2: 25 mmol/L (ref 22–32)
Calcium: 8.3 mg/dL — ABNORMAL LOW (ref 8.9–10.3)
Chloride: 105 mmol/L (ref 98–111)
Creatinine, Ser: 0.55 mg/dL (ref 0.44–1.00)
GFR calc Af Amer: >60 mL/min (ref >60)
GFR calc non Af Amer: >60 mL/min (ref >60)
Glucose, Bld: 132 mg/dL — ABNORMAL HIGH (ref 70–99)
Potassium: 3.6 mmol/L (ref 3.5–5.1)
Sodium: 140 mmol/L (ref 135–145)

## 2020-03-29 LAB — CBC
HCT: 28.9 % — ABNORMAL LOW (ref 36.0–46.0)
Hemoglobin: 8.7 g/dL — ABNORMAL LOW (ref 12.0–15.0)
MCH: 24.4 pg — ABNORMAL LOW (ref 26.0–34.0)
MCHC: 30.1 g/dL (ref 30.0–36.0)
MCV: 81 fL (ref 80.0–100.0)
Platelets: 228 10*3/uL (ref 150–400)
RBC: 3.57 MIL/uL — ABNORMAL LOW (ref 3.87–5.11)
RDW: 17.6 % — ABNORMAL HIGH (ref 11.5–15.5)
WBC: 15.5 10*3/uL — ABNORMAL HIGH (ref 4.0–10.5)
nRBC: 0 % (ref 0.0–0.2)

## 2020-03-29 LAB — LEVETIRACETAM LEVEL: Levetiracetam Lvl: 25.4 ug/mL (ref 10.0–40.0)

## 2020-03-29 LAB — GLUCOSE, CAPILLARY
Glucose-Capillary: 100 mg/dL — ABNORMAL HIGH (ref 70–99)
Glucose-Capillary: 107 mg/dL — ABNORMAL HIGH (ref 70–99)
Glucose-Capillary: 108 mg/dL — ABNORMAL HIGH (ref 70–99)
Glucose-Capillary: 110 mg/dL — ABNORMAL HIGH (ref 70–99)
Glucose-Capillary: 112 mg/dL — ABNORMAL HIGH (ref 70–99)
Glucose-Capillary: 147 mg/dL — ABNORMAL HIGH (ref 70–99)

## 2020-03-29 LAB — PHOSPHORUS: Phosphorus: 2.1 mg/dL — ABNORMAL LOW (ref 2.5–4.6)

## 2020-03-29 LAB — MAGNESIUM: Magnesium: 1.6 mg/dL — ABNORMAL LOW (ref 1.7–2.4)

## 2020-03-29 MED ORDER — SODIUM CHLORIDE 0.9 % IV SOLN
750.0000 mg | Freq: Two times a day (BID) | INTRAVENOUS | Status: DC
  Administered 2020-03-29 – 2020-03-30 (×4): 750 mg via INTRAVENOUS
  Filled 2020-03-29 (×5): qty 7.5

## 2020-03-29 MED ORDER — LABETALOL HCL 5 MG/ML IV SOLN
10.0000 mg | INTRAVENOUS | Status: DC | PRN
  Administered 2020-03-29 – 2020-03-30 (×7): 20 mg via INTRAVENOUS
  Filled 2020-03-29 (×7): qty 4

## 2020-03-29 MED ORDER — ORAL CARE MOUTH RINSE
15.0000 mL | Freq: Two times a day (BID) | OROMUCOSAL | Status: DC
  Administered 2020-03-29 – 2020-04-02 (×8): 15 mL via OROMUCOSAL

## 2020-03-29 MED ORDER — POTASSIUM PHOSPHATES 15 MMOLE/5ML IV SOLN
15.0000 mmol | Freq: Once | INTRAVENOUS | Status: AC
  Administered 2020-03-29: 15 mmol via INTRAVENOUS
  Filled 2020-03-29: qty 5

## 2020-03-29 MED ORDER — FENTANYL 2500MCG IN NS 250ML (10MCG/ML) PREMIX INFUSION
0.0000 ug/h | INTRAVENOUS | Status: DC
  Administered 2020-03-29: 25 ug/h via INTRAVENOUS
  Filled 2020-03-29: qty 250

## 2020-03-29 MED ORDER — POTASSIUM CHLORIDE 20 MEQ/15ML (10%) PO SOLN
20.0000 meq | Freq: Once | ORAL | Status: AC
  Administered 2020-03-29: 20 meq
  Filled 2020-03-29: qty 15

## 2020-03-29 MED ORDER — LIP MEDEX EX OINT
TOPICAL_OINTMENT | CUTANEOUS | Status: AC
  Filled 2020-03-29: qty 7

## 2020-03-29 MED ORDER — PHENOL 1.4 % MT LIQD
1.0000 | OROMUCOSAL | Status: DC | PRN
  Administered 2020-03-30 (×2): 1 via OROMUCOSAL
  Filled 2020-03-29: qty 177

## 2020-03-29 MED ORDER — MAGNESIUM SULFATE 4 GM/100ML IV SOLN
4.0000 g | Freq: Once | INTRAVENOUS | Status: AC
  Administered 2020-03-29: 4 g via INTRAVENOUS
  Filled 2020-03-29: qty 100

## 2020-03-29 NOTE — Progress Notes (Signed)
[facility] ADULT ICU REPLACEMENT PROTOCOL   The patient does apply for the [facility] Adult ICU Electrolyte Replacment Protocol based on the criteria listed below:   1. Is GFR >/= 30 ml/min? Yes.    Patient's GFR today is >60 2. Is SCr </= 2? Yes.   Patient's SCr is 0.55 ml/kg/hr 3. Did SCr increase >/= 0.5 in 24 hours? 4. Abnormal electrolyte(s): K+ 3.6, Mg 1.6, Phos 2.1 5. Ordered repletion with: Protocol 6. If a panic level lab has been reported, has the CCM MD in charge been notified? Yes.  .   Physician:  Randie Heinz 03/29/2020 4:38 AM

## 2020-03-29 NOTE — Progress Notes (Signed)
eLink Physician-Brief Progress Note Patient Name: Jordan Rivas DOB: [birth year] MRN: [id number]   Date of Service  03/29/2020  HPI/Events of Note  Hypertriglyceridemia - Triglyceride level = 165. Patient is currently on a Propofol IV infusion.   eICU Interventions  Plan: 1. D/C Propofol IV infusion.  2. Fentanyl IV infusion. Titrate to RASS = 0 to -1.     Intervention Category Major Interventions: Other:  Levern Pitter Cornelia Copa 03/29/2020, 4:44 AM

## 2020-03-29 NOTE — Progress Notes (Signed)
Pt triglyceride level elevated, elink called and orders to d/c prop and start fentanyl infusion. Weaned off Propofol and Fentanyl initiated, pt restless in bed, increased fentanyl, pt noted to become more restless and pulling at tube. Elink telehealth called in room and this RN explained how pt now maxed out on Fentanyl and may need more sedation to not pull ET tube out. New order parameter for Fentanyl placed by MD, will go up on the Fentanyl as needed.

## 2020-03-29 NOTE — TOC Initial Note (Signed)
Transition of Care [facility]) - Initial/Assessment Note    Patient Details  Name: Jordan Rivas MRN: [id number] Date of Birth: October 27, 1960  Transition of Care [facility]) CM/SW Contact:    Leeroy Cha, RN Phone Number: 03/29/2020, 7:48 AM  Clinical Narrative:                 Patient from home has pcp lives alone, son is contact. Was ventilated in the ed now on bipap at 70%The patient has a history of of cva, Admitted with ams and gi bleed, iv maxipime, iv keppra, flagyl,protonix, kpo4, and vancomycin.  WCB 15.5 hgb 8.7 s/p 1 unikt of prbc. Plan will follow for toc needs and plan is to return home.  Expected Discharge Plan: Home/Self Care Barriers to Discharge: Continued Medical Work up   Patient Goals and CMS Choice Patient states their goals for this hospitalization and ongoing recovery are:: to go home CMS Medicare.gov Compare Post Acute Care list provided to:: Patient Represenative (must comment) (son)    Expected Discharge Plan and Services Expected Discharge Plan: Home/Self Care   Discharge Planning Services: CM Consult   Living arrangements for the past 2 months: Single Family Home                                      Prior Living Arrangements/Services Living arrangements for the past 2 months: Single Family Home Lives with:: Self Patient language and need for interpreter reviewed:: Yes Do you feel safe going back to the place where you live?: Yes      Need for Family Participation in Patient Care: Yes (Comment) Care giver support system in place?: Yes (comment)   Criminal Activity/Legal Involvement Pertinent to Current Situation/Hospitalization: No - Comment as needed  Activities of Daily Living Home Assistive Devices/Equipment: CBG Meter, Wheelchair, Eyeglasses, Cane (specify quad or straight) (singlepoint cane) ADL Screening (condition at time of admission) Patient's cognitive ability adequate to safely complete daily activities?: No (patient currently  intubated) Is the patient deaf or have difficulty hearing?: No Does the patient have difficulty seeing, even when wearing glasses/contacts?: Yes (visual issue of left eye) Does the patient have difficulty concentrating, remembering, or making decisions?: Yes (currently intubated) Patient able to express need for assistance with ADLs?: No (currently intubated) Does the patient have difficulty dressing or bathing?: Yes Independently performs ADLs?: No Communication: Dependent (currently intubated) Is this a change from baseline?: Change from baseline, expected to last >3 days Dressing (OT): Dependent Is this a change from baseline?: Change from baseline, expected to last >3 days Grooming: Dependent Is this a change from baseline?: Change from baseline, expected to last >3 days Feeding: Dependent Is this a change from baseline?: Change from baseline, expected to last >3 days Bathing: Dependent Is this a change from baseline?: Change from baseline, expected to last >3 days Toileting: Dependent Is this a change from baseline?: Change from baseline, expected to last >3days In/Out Bed: Dependent Is this a change from baseline?: Change from baseline, expected to last >3 days Walks in Home: Dependent Is this a change from baseline?: Change from baseline, expected to last >3 days Does the patient have difficulty walking or climbing stairs?: Yes (secondary to left sided wekaness) Weakness of Legs: Both (L>R) Weakness of Arms/Hands: Both (L>R)  Permission Sought/Granted                  Emotional Assessment Appearance:: Appears stated  age     Orientation: : Fluctuating Orientation (Suspected and/or reported Sundowners) Alcohol / Substance Use: Not Applicable Psych Involvement: No (comment)  Admission diagnosis:  Lactic acidosis [E87.2] Stupor [R40.1] Altered mental status [R41.82] UGI bleed [K92.2] Fever, unspecified fever cause [R50.9] Anemia, unspecified type [D64.9] Patient  Active Problem List   Diagnosis Date Noted  . Chronic migraine 08/19/2019  . Seizure disorder ([place]) 06/23/2019  . Spastic hemiplegia of left nondominant side as late effect of cerebral infarction ([place]) 06/17/2019  . Insomnia 10/27/2014  . Spastic hemiplegia affecting left nondominant side ([place]) 03/04/2014  . NSTEMI (non-ST elevated myocardial infarction) ([place]) 02/27/2013  . Acute combined systolic and diastolic heart failure ([place]) 02/25/2013  . Sepsis associated hypotension ([place]) 02/21/2013  . UTI (lower urinary tract infection) 02/21/2013  . ARF (acute renal failure) ([place]) 02/21/2013  . Fall 02/21/2013  . Altered mental status 02/21/2013  . Severe sepsis ([place]) 02/21/2013  . Seizure ([place]) 01/28/2012  . H/O 01/28/2012  . Hypertension   . Hypercholesteremia   . GERD (gastroesophageal reflux disease)    PCP:  Mayra Neer, MD Pharmacy:   [facility] [place], [place] - [phone] [place] [address] Phone: [phone] Fax: [phone]  [facility] [place] [place] [address] Phone: [phone] Fax: [phone]     Social Determinants of Health (SDOH) Interventions    Readmission Risk Interventions No flowsheet data found.

## 2020-03-29 NOTE — Progress Notes (Signed)
Pt continued to be restless and pulling at tubes. Maxed her out on Fentanyl at 300. Pt continued to be restless and agitated. Left room for a brief minute had another RN keep an eye on pt. Pt pulled out ET tube and orogastric tube. Elink called immediately and pt placed on non-rebreather oxygen saturation stable at 98-100%. Fentanyl stopped when tube came out. RT also at bedside, placed pt on Bipap. Pt stable and oxygen saturation maintaining. Pt able to follow commands and family at bedside. Report given to dayshift nurse.

## 2020-03-29 NOTE — Progress Notes (Signed)
[facility] Progress Note  Jordan Rivas 59 y.o. 16-Apr-1961  CC:  Anemia  Subjective: Patient denies any abdominal pain, nausea, or vomiting. Denies recent melena or hematochezia.  ROS : Review of Systems  Cardiovascular: Negative for chest pain and palpitations.  Gastrointestinal: Negative for abdominal pain, blood in stool, constipation, diarrhea, heartburn, melena, nausea and vomiting.   Objective: Vital signs in last 24 hours: Vitals:   03/29/20 0900 03/29/20 1000  BP: (!) 141/66 (!) 133/51  Pulse: (!) 113 (!) 113  Resp: 13 (!) 11  Temp: 99.1 F (37.3 C) 99.5 F (37.5 C)  SpO2: 100% 93%    Physical Exam:  General:  Lethargic, no acute distress; [place] in place  Head:  Normocephalic, without obvious abnormality, atraumatic  Eyes:  Conjunctival pallor, EOMs intact   Lungs:   Clear to auscultation bilaterally, respirations unlabored  Heart:  Tachycardic with regular rhythm  Abdomen:   Soft, non-tender, bowel sounds active all four quadrants,  no guarding or peritoneal signs  Extremities: Extremities normal, atraumatic, no  edema  Pulses: 2+ and symmetric    Lab Results: Recent Labs    03/28/20 0220 03/28/20 0220 03/28/20 0837 03/29/20 0325  NA 135  --   --  140  K 4.0  --   --  3.6  CL 105  --   --  105  CO2 16*  --   --  25  GLUCOSE 77  --   --  132*  BUN 9  --   --  6*  CREATININE 0.38*   < > 0.68 0.55  CALCIUM 7.0*  --   --  8.3*  MG  --   --   --  1.6*  PHOS  --   --   --  2.1*   < > = values in this interval not displayed.   Recent Labs    03/28/20 0220  AST 13*  ALT 17  ALKPHOS 49  BILITOT 0.6  PROT 3.1*  ALBUMIN 1.5*   Recent Labs    03/28/20 0220 03/28/20 1010 03/28/20 1620 03/29/20 0325  WBC 9.6   < > 19.8* 15.5*  NEUTROABS 8.2*  --   --   --   HGB 6.0*   < > 9.7* 8.7*  HCT 21.4*   < > 33.0* 28.9*  MCV 87.0   < > 82.1 81.0  PLT 144*   < > 64* 228   < > = values in this interval not displayed.   Recent Labs     03/28/20 0220  LABPROT 18.0*  INR 1.6*   Assessment: Anemia, coffee ground emesis x 1 -EGD 03/28/20 revealed hiatal hernia and gastritis but no active bleeding. -Hgb 8.6 today.  Yesterday, Hgb 6.0 on arrival, improved to 10.0 after 1u pRBCs -BUN 6 with Cr 0.55  Altered mental status, improving -No acute intracranial abnormality on CT 03/28/20 -EEG unremarkable -UDS positive for opiates  Lactic acidosis  Moderate burden of stool and stool balls throughout the colon.  Plan: Continue Protonix 40 mg BID.  Continue to monitor H&H with transfusion as needed to maintain Hgb >7.  Continue to monitor for further signs of GI bleeding.  [facility] will sign off. Please contact us if we can be of any further assistance during this hospital stay.  Salley Slaughter PA-C 03/29/2020, 12:24 PM  Contact #  [phone]

## 2020-03-29 NOTE — Progress Notes (Addendum)
NAME:  Jordan Rivas, MRN:  [id number], DOB:  October 22, 1960, LOS: 1 ADMISSION DATE:  03/28/2020, CONSULTATION DATE: 03/28/2020 REFERRING MD: EDP, CHIEF COMPLAINT: Altered mental status, fevers, GI bleed  Brief History   59 year old with history as noted below Presenting with altered mental status, hypoxic to the 50s at home, fevers of 102, lactate >11.  Intubated in the ED Coffee-ground emesis noted in the posterior pharynx with hemoglobin of 6. PCCM consulted for admission.  Past Medical History    has a past medical history of Chronic pain, CVA (cerebrovascular accident) ([place]) (1995), Dental caries, Depression, Diabetes mellitus without complication ([place]), GERD (gastroesophageal reflux disease), Headache, Hypercholesteremia, Hypertension, Insomnia, Neuropathy, Overactive bladder, Peripheral vascular disease ([place]), Pneumonia, and Seizures ([place]) (01/27/12).   History of right hemispheric stroke due to brain aneurysm at age 13 with residual left spastic hemiparesis, gait difficulty.  Seizure disorder with breakthrough seizures in 2018.  Significant Hospital Events   7/26-admit  Consults:    Procedures:  ET tube 7/26  Significant Diagnostic Tests:  CT head 7/26-no acute intracranial abnormality, old MCA territory infarct, meningioma. EGD 7/26>>> small hiatal hernia, chronic gastritis  EEG 7/26>>> severe diffuse encephalopathy, nonspecific etiology but could be related to sedation.  No seizures or epileptiform discharges were seen throughout the recording.  Micro Data:  Blood cultures 7/26>>> Urine 7/26>>> Ecoli>>>  Antimicrobials:  Vanco 7/26 Cefepime 7/26 Flagyl 7/26  Interim history/subjective:  Agitated overnight, self extubated.  Resp status stable on [place]  Remains altered, confused, oriented x2, thinks I am "troy"   Objective   Blood pressure (!) 131/64, pulse 102, temperature 98.8 F (37.1 C), temperature source Bladder, resp. rate 20, height 5\' 1"  (1.549 m), weight 87 kg,  last menstrual period 09/30/2011, SpO2 100 %.    Vent Mode: BIPAP;PCV FiO2 (%):  [40 %-70 %] 40 % Set Rate:  [8 bmp-16 bmp] 8 bmp Vt Set:  [380 mL] 380 mL PEEP:  [5 cmH20] 5 cmH20 Plateau Pressure:  [17 cmH20-19 cmH20] 17 cmH20   Intake/Output Summary (Last 24 hours) at 03/29/2020 0954 Last data filed at 03/29/2020 0800 Gross per 24 hour  Intake 2186.58 ml  Output 4350 ml  Net -2163.42 ml   Filed Weights   03/28/20 0027  Weight: 87 kg    Examination: Blood pressure (!) 114/60, pulse 99, temperature 99.5 F (37.5 C), resp. rate 16, height 5\' 1"  (1.549 m), weight 87 kg, last menstrual period 09/30/2011, SpO2 100 %. Gen:      No acute distress HEENT:  EOMI, sclera anicteric, ETT Neck:     No masses; no thyromegaly Lungs:    Clear to auscultation bilaterally; normal respiratory effort on 2L [place] CV:         Regular rate and rhythm; no murmurs Abd:      + bowel sounds; soft, non-tender; no palpable masses, no distension Ext:    No edema; adequate peripheral perfusion Skin:      Warm and dry; no rash Neuro:  Awake, alert, oriented x2, speech slightly slurred, non focal, confused   Resolved Hospital Problem list     Assessment & Plan:   Altered mental status - EEG neg siezure, CT head neg acute. Improving slowly. ?metabolic  PLAN -  Consider MRI, neuro input  Continue Keppra and Elavil  Low hemoglobin, possible GI bleed - gastritis seen on EGD PLAN -  Transition off protonix gtt  GI will see PRN  F/u CBC  Transfuse PRBC  Fevers, lactic acidosis - ?UTI.  Lactate cleared 11->6->1.2 PLAN -  Continue broad antibiotic coverage for now  follow cultures  Acute respiratory failure, hypoxia Follow ABG and chest x-ray  Diabetes mellitus Hold Metformin given lactic acidosis SSI coverage  Goals of care Discussed with son.  Patient had previously told him that she would not want CPR in case of cardiac arrest Limited code order entered.  Best practice:  Diet:  NPO Pain/Anxiety/Delirium protocol (if indicated): n/a VAP protocol (if indicated): Ordered DVT prophylaxis: SCDs GI prophylaxis: PPI Glucose control: [place] Mobility: Bed Code Status: Full Family Communication: Son updated 7/26 Disposition: ICU  Labs   CBC: Recent Labs  Lab 03/28/20 0220 03/28/20 1010 03/28/20 1620 03/29/20 0325  WBC 9.6 20.8* 19.8* 15.5*  NEUTROABS 8.2*  --   --   --   HGB 6.0* 10.0* 9.7* 8.7*  HCT 21.4* 34.2* 33.0* 28.9*  MCV 87.0 83.8 82.1 81.0  PLT 144* 257 64* 701    Basic Metabolic Panel: Recent Labs  Lab 03/28/20 0220 03/28/20 0837 03/29/20 0325  NA 135  --  140  K 4.0  --  3.6  CL 105  --  105  CO2 16*  --  25  GLUCOSE 77  --  132*  BUN 9  --  6*  CREATININE 0.38* 0.68 0.55  CALCIUM 7.0*  --  8.3*  MG  --   --  1.6*  PHOS  --   --  2.1*   GFR: Estimated Creatinine Clearance: 74 mL/min (by C-G formula based on SCr of 0.55 mg/dL). Recent Labs  Lab 03/28/20 0220 03/28/20 0430 03/28/20 0837 03/28/20 1010 03/28/20 1150 03/28/20 1620 03/29/20 0325  PROCALCITON  --   --   --  2.40  --   --   --   WBC 9.6  --   --  20.8*  --  19.8* 15.5*  LATICACIDVEN >11.0* 6.1* 1.2  --  1.9  --   --     Liver Function Tests: Recent Labs  Lab 03/28/20 0220  AST 13*  ALT 17  ALKPHOS 49  BILITOT 0.6  PROT 3.1*  ALBUMIN 1.5*   No results for input(s): LIPASE, AMYLASE in the last 168 hours. No results for input(s): AMMONIA in the last 168 hours.  ABG    Component Value Date/Time   PHART 7.430 03/29/2020 0345   PCO2ART 44.1 03/29/2020 0345   PO2ART 80.6 (L) 03/29/2020 0345   HCO3 28.7 (H) 03/29/2020 0345   O2SAT 95.3 03/29/2020 0345     Coagulation Profile: Recent Labs  Lab 03/28/20 0220  INR 1.6*    Cardiac Enzymes: No results for input(s): CKTOTAL, CKMB, CKMBINDEX, TROPONINI in the last 168 hours.  HbA1C: Hgb A1c MFr Bld  Date/Time Value Ref Range Status  03/28/2020 10:10 AM 7.1 (H) 4.8 - 5.6 % Final    Comment:     (NOTE) Pre diabetes:          5.7%-6.4%  Diabetes:              >6.4%  Glycemic control for   <7.0% adults with diabetes   09/13/2017 08:13 AM 6.5 (H) 4.8 - 5.6 % Final    Comment:    (NOTE) Pre diabetes:          5.7%-6.4% Diabetes:              >6.4% Glycemic control for   <7.0% adults with diabetes     CBG: Recent Labs  Lab 03/28/20 1606 03/28/20 1951 03/28/20  2323 03/29/20 0349 03/29/20 0756  GLUCAP 115* 91 98 112* 100*     Critical care time:    The patient is critically ill with multiple organ system failure and requires high complexity decision making for assessment and support, frequent evaluation and titration of therapies, advanced monitoring, review of radiographic studies and interpretation of complex data.   Critical Care Time devoted to patient care services, exclusive of separately billable procedures, described in this note is 31 minutes.   Nickolas Madrid, NP Pulmonary/Critical Care Medicine  03/29/2020  9:54 AM

## 2020-03-29 NOTE — Progress Notes (Signed)
[place] Progress Note Patient Name: Jordan Rivas DOB: 1960-12-08 MRN: [id number]   Date of Service  03/29/2020  HPI/Events of Note  Notified of BP 186/89  HR 102 Patient seen not in distress  eICU Interventions  Prn labetalol ordered        Judd Lien 03/29/2020, 9:51 PM

## 2020-03-29 NOTE — Progress Notes (Signed)
[place] Progress Note Patient Name: Jordan Rivas DOB: 07-Dec-1960 MRN: [id number]   Date of Service  03/29/2020  HPI/Events of Note  Agitation - Request to increase ceiling on Fentanyl IV infusion.   eICU Interventions  Plan: 1. Increase ceiling on Fentanyl IV infusion to 300 mcg/hour.      Intervention Category Major Interventions: Delirium, psychosis, severe agitation - evaluation and management  Valeta Paz [place] 03/29/2020, 6:29 AM

## 2020-03-30 DIAGNOSIS — R401 Stupor: Secondary | ICD-10-CM | POA: Diagnosis not present

## 2020-03-30 LAB — URINE CULTURE: Culture: 50000 — AB

## 2020-03-30 LAB — BASIC METABOLIC PANEL
Anion gap: 11 (ref 5–15)
BUN: 6 mg/dL — ABNORMAL LOW (ref 8–23)
CO2: 27 mmol/L (ref 22–32)
Calcium: 8.4 mg/dL — ABNORMAL LOW (ref 8.9–10.3)
Chloride: 103 mmol/L (ref 98–111)
Creatinine, Ser: 0.61 mg/dL (ref 0.44–1.00)
GFR calc Af Amer: >60 mL/min (ref >60)
GFR calc non Af Amer: >60 mL/min (ref >60)
Glucose, Bld: 135 mg/dL — ABNORMAL HIGH (ref 70–99)
Potassium: 2.9 mmol/L — ABNORMAL LOW (ref 3.5–5.1)
Sodium: 141 mmol/L (ref 135–145)

## 2020-03-30 LAB — PHOSPHORUS: Phosphorus: 2.6 mg/dL (ref 2.5–4.6)

## 2020-03-30 LAB — GLUCOSE, CAPILLARY
Glucose-Capillary: 133 mg/dL — ABNORMAL HIGH (ref 70–99)
Glucose-Capillary: 96 mg/dL (ref 70–99)
Glucose-Capillary: 108 mg/dL — ABNORMAL HIGH (ref 70–99)
Glucose-Capillary: 149 mg/dL — ABNORMAL HIGH (ref 70–99)
Glucose-Capillary: 115 mg/dL — ABNORMAL HIGH (ref 70–99)
Glucose-Capillary: 122 mg/dL — ABNORMAL HIGH (ref 70–99)

## 2020-03-30 LAB — CBC
HCT: 30.2 % — ABNORMAL LOW (ref 36.0–46.0)
Hemoglobin: 9.5 g/dL — ABNORMAL LOW (ref 12.0–15.0)
MCH: 24.5 pg — ABNORMAL LOW (ref 26.0–34.0)
MCHC: 31.5 g/dL (ref 30.0–36.0)
MCV: 78 fL — ABNORMAL LOW (ref 80.0–100.0)
Platelets: 262 10*3/uL (ref 150–400)
RBC: 3.87 MIL/uL (ref 3.87–5.11)
RDW: 17.7 % — ABNORMAL HIGH (ref 11.5–15.5)
WBC: 17.5 10*3/uL — ABNORMAL HIGH (ref 4.0–10.5)
nRBC: 0 % (ref 0.0–0.2)

## 2020-03-30 LAB — MAGNESIUM: Magnesium: 2.1 mg/dL (ref 1.7–2.4)

## 2020-03-30 MED ORDER — POTASSIUM CHLORIDE 10 MEQ/50ML IV SOLN
10.0000 meq | INTRAVENOUS | Status: AC
  Administered 2020-03-30 (×5): 10 meq via INTRAVENOUS
  Filled 2020-03-30 (×3): qty 50

## 2020-03-30 MED ORDER — METOPROLOL TARTRATE 5 MG/5ML IV SOLN
5.0000 mg | Freq: Four times a day (QID) | INTRAVENOUS | Status: DC
  Administered 2020-03-30 – 2020-03-31 (×4): 5 mg via INTRAVENOUS
  Filled 2020-03-30 (×4): qty 5

## 2020-03-30 MED ORDER — SODIUM CHLORIDE 0.9 % IV SOLN
1.0000 g | INTRAVENOUS | Status: DC
  Administered 2020-03-30: 1 g via INTRAVENOUS
  Filled 2020-03-30 (×2): qty 10

## 2020-03-30 NOTE — Progress Notes (Addendum)
NAME:  Jordan Rivas, MRN:  [id number], DOB:  1961/02/12, LOS: 2 ADMISSION DATE:  03/28/2020, CONSULTATION DATE: 03/28/2020 REFERRING MD: EDP, CHIEF COMPLAINT: Altered mental status, fevers, GI bleed  Brief History   59 year old F admitted 7/26 with reports of altered mental status, hypoxic to the 50s at home, fevers of 102, lactate >11.  Intubated in the ED.  Coffee-ground emesis noted in the posterior pharynx with hemoglobin of 6.  Admitted with working diagnosis of sepsis with concern for UTI, suspected GIB.  PCCM consulted for admission.  Past Medical History    has a past medical history of Chronic pain, CVA (cerebrovascular accident) ([place]) (1995), Dental caries, Depression, Diabetes mellitus without complication ([place]), GERD (gastroesophageal reflux disease), Headache, Hypercholesteremia, Hypertension, Insomnia, Neuropathy, Overactive bladder, Peripheral vascular disease ([place]), Pneumonia, and Seizures ([place]) (01/27/12).   History of right hemispheric stroke due to brain aneurysm at age 23 with residual left spastic hemiparesis, gait difficulty / walks at home with a cane.  Seizure disorder with breakthrough seizures in 2018.  Significant Hospital Events   7/26 Admit 7/27 Agitated overnight, self extubated.  Resp status stable on [place]. Remains altered, confused, oriented x2, thinks I am "troy"   Consults:  GI  Procedures:  ETT 7/26 >> 7/27   Significant Diagnostic Tests:  CT head 7/26 >> no acute intracranial abnormality, old MCA territory infarct, meningioma. EGD 7/26 >> small hiatal hernia, chronic gastritis  EEG 7/26 >> severe diffuse encephalopathy, nonspecific etiology but could be related to sedation.  No seizures or epileptiform discharges were seen throughout the recording.  Micro Data:  Blood cultures 7/26 >> Urine 7/26 >> Ecoli >> pan-sensitive  Tracheal aspirate 7/26 >> abundant WBC, mod GPC >>  Antimicrobials:  Vanco 7/26 >> Cefepime 7/26 >> 7/28 Flagyl 7/26 >>  7/28 Ceftriaxone 7/28 >>   Interim history/subjective:  Pt reports feeling much better. States she is hungry.  Son at bedside, states she seems back to baseline mental status  On RA  Tmax 99 / WBC 17.5   Objective   Blood pressure (!) 173/84, pulse 92, temperature 99.5 F (37.5 C), resp. rate (!) 46, height 5\' 1"  (1.549 m), weight 90.2 kg, last menstrual period 09/30/2011, SpO2 90 %.        Intake/Output Summary (Last 24 hours) at 03/30/2020 0944 Last data filed at 03/30/2020 0600 Gross per 24 hour  Intake 4929.56 ml  Output 3850 ml  Net 1079.56 ml   Filed Weights   03/28/20 0027 03/30/20 0401  Weight: 87 kg 90.2 kg    Examination: General: chronically ill appearing female sitting up in bed in NAD  HEENT: MM pink/moist, RA, anicteric Neuro: Awake, alert, oriented to person, place, time, spontaneous movement on right, no LLE movement (baseline) CV: s1s2 RRR, no m/r/g PULM: non-labored on RA, strong cough able to produce thick brown sputum, lungs bilaterally clear anterior, diminished bases  GI: soft, bsx4 active  Extremities: warm/dry, trace LE  edema  Skin: no rashes or lesions  Resolved Hospital Problem list     Assessment & Plan:   Acute Metabolic Encephalopathy  Hx Seizures EEG neg siezure, CT head neg acute. Improving slowly.  -delirium prevention measures  -promote sleep / wake cycle -limit sedating medications  -continue keppra, elavil   Anemia, possible GI bleed  Gastritis seen on EGD, GI to see PRN.  S/p PRBCx1 7/26 -follow CBC -transfuse for Hgb <7% or active bleeding  -PPI BID   Fevers, lactic acidosis - ?UTI. Lactate cleared 11->6->1.2 -  discontinue flagyl -narrow abx to rocephin, vanco -follow cultures, pending tracheal aspirate, stop vancomycin  -KVO IVF's   Acute Hypoxic Respiratory Failure Bibasilar Consolidation, RML/RLL suspected Aspiration PNA -wean O2 for sats >90% -follow intermittent CXR   Diabetes mellitus -hold home metformin   -SSI   Hypokalemia  -KCL IV  -follow up BMP   At Risk Aspiration  -SLP evaluation before PO's   Goals of care Discussed with son.  Patient had previously told him that she would not want CPR in case of cardiac arrest Limited code.  Confirmed with patient 7/28.    Best practice:  Diet: NPO Pain/Anxiety/Delirium protocol (if indicated): n/a VAP protocol (if indicated): Ordered DVT prophylaxis: SCDs GI prophylaxis: PPI Glucose control: [place] Mobility: Bed Code Status: Full Family Communication: Transfer to SDU, to [place] as of 7/29 0700 Disposition: ICU  Labs   CBC: Recent Labs  Lab 03/28/20 0220 03/28/20 1010 03/28/20 1620 03/29/20 0325 03/30/20 0414  WBC 9.6 20.8* 19.8* 15.5* 17.5*  NEUTROABS 8.2*  --   --   --   --   HGB 6.0* 10.0* 9.7* 8.7* 9.5*  HCT 21.4* 34.2* 33.0* 28.9* 30.2*  MCV 87.0 83.8 82.1 81.0 78.0*  PLT 144* 257 64* 228 073    Basic Metabolic Panel: Recent Labs  Lab 03/28/20 0220 03/28/20 0837 03/29/20 0325 03/30/20 0414  NA 135  --  140 141  K 4.0  --  3.6 2.9*  CL 105  --  105 103  CO2 16*  --  25 27  GLUCOSE 77  --  132* 135*  BUN 9  --  6* 6*  CREATININE 0.38* 0.68 0.55 0.61  CALCIUM 7.0*  --  8.3* 8.4*  MG  --   --  1.6* 2.1  PHOS  --   --  2.1* 2.6   GFR: Estimated Creatinine Clearance: 75.5 mL/min (by C-G formula based on SCr of 0.61 mg/dL). Recent Labs  Lab 03/28/20 0220 03/28/20 0220 03/28/20 0430 03/28/20 0837 03/28/20 1010 03/28/20 1150 03/28/20 1620 03/29/20 0325 03/30/20 0414  PROCALCITON  --   --   --   --  2.40  --   --   --   --   WBC 9.6   < >  --   --  20.8*  --  19.8* 15.5* 17.5*  LATICACIDVEN >11.0*  --  6.1* 1.2  --  1.9  --   --   --    < > = values in this interval not displayed.    Liver Function Tests: Recent Labs  Lab 03/28/20 0220  AST 13*  ALT 17  ALKPHOS 49  BILITOT 0.6  PROT 3.1*  ALBUMIN 1.5*   No results for input(s): LIPASE, AMYLASE in the last 168 hours. No results for input(s):  AMMONIA in the last 168 hours.  ABG    Component Value Date/Time   PHART 7.430 03/29/2020 0345   PCO2ART 44.1 03/29/2020 0345   PO2ART 80.6 (L) 03/29/2020 0345   HCO3 28.7 (H) 03/29/2020 0345   O2SAT 95.3 03/29/2020 0345     Coagulation Profile: Recent Labs  Lab 03/28/20 0220  INR 1.6*    Cardiac Enzymes: No results for input(s): CKTOTAL, CKMB, CKMBINDEX, TROPONINI in the last 168 hours.  HbA1C: Hgb A1c MFr Bld  Date/Time Value Ref Range Status  03/28/2020 10:10 AM 7.1 (H) 4.8 - 5.6 % Final    Comment:    (NOTE) Pre diabetes:  5.7%-6.4%  Diabetes:              >6.4%  Glycemic control for   <7.0% adults with diabetes   09/13/2017 08:13 AM 6.5 (H) 4.8 - 5.6 % Final    Comment:    (NOTE) Pre diabetes:          5.7%-6.4% Diabetes:              >6.4% Glycemic control for   <7.0% adults with diabetes     CBG: Recent Labs  Lab 03/29/20 1551 03/29/20 2017 03/29/20 2331 03/30/20 0355 03/30/20 0800  GLUCAP 147* 108* 110* 133* 96     Critical care time:     Noe Gens, MSN, NP-C [facility] 03/30/2020, 9:57 AM   Please see [email or web address] for pager details.

## 2020-03-30 NOTE — TOC Progression Note (Signed)
Transition of Care [facility]) - Progression Note    Patient Details  Name: Jordan Rivas MRN: [id number] Date of Birth: 03/22/1961  [facility]) CM/SW Contact  Leeroy Cha, RN Phone Number: 03/30/2020, 8:05 AM  Clinical Narrative:    s- urine culture is positive for e.coli, wbc 17.5, iv Maxipime, vancomycin, keppra and flagyl;. AMS some what clearer. Did self extubate on [id number] now on room air. Plan- form home will follow for toc needs.  Expected Discharge Plan: Home/Self Care Barriers to Discharge: Continued Medical Work up  Expected Discharge Plan and Services Expected Discharge Plan: Home/Self Care   Discharge Planning Services: CM Consult   Living arrangements for the past 2 months: Single Family Home                                       Social Determinants of Health (SDOH) Interventions    Readmission Risk Interventions No flowsheet data found.

## 2020-03-30 NOTE — Evaluation (Signed)
Clinical/Bedside Swallow Evaluation Patient Details  Name: Jordan Rivas MRN: [id number] Date of Birth: June 25, 1961  Today's Date: 03/30/2020 Time: SLP Start Time (ACUTE ONLY): 1355 SLP Stop Time (ACUTE ONLY): 1420 SLP Time Calculation (min) (ACUTE ONLY): 25 min  Past Medical History:  Past Medical History:  Diagnosis Date  . Chronic pain    "in this left arm and leg that don't move"  . CVA (cerebrovascular accident) ([place]) 1995   "left side paralyzed"  . Dental caries   . Depression   . Diabetes mellitus without complication ([place])   . GERD (gastroesophageal reflux disease)   . Headache   . Hypercholesteremia   . Hypertension   . Insomnia   . Neuropathy   . Overactive bladder   . Peripheral vascular disease ([place])    left arm and leg; since stroke 1995  . Pneumonia   . Seizures ([place]) 01/27/12   "first one ever"   Past Surgical History:  Past Surgical History:  Procedure Laterality Date  . CEREBRAL ANEURYSM REPAIR  11/1993   "left my left side paralyzed"  . DENTAL RESTORATION/EXTRACTION WITH X-RAY N/A 09/13/2017   Procedure: DENTAL RESTORATION/EXTRACTION;  Surgeon: Diona Browner, DDS;  Location: [facility];  Service: Oral Surgery;  Laterality: N/A;  . ESOPHAGOGASTRODUODENOSCOPY N/A 03/28/2020   Procedure: ESOPHAGOGASTRODUODENOSCOPY (EGD);  Surgeon: Clarene Essex, MD;  Location: [facility];  Service: Endoscopy;  Laterality: N/A;  bedside endoscopy  . TUBAL LIGATION  04/1984   HPI:  59yo female admitted 03/28/20 with AMS, hypoxia, fever (102), coffee ground emesis. PMH: chronic pain, CVA with residual left spastic hemiparesis (1995) dental caries, depression, DM, GERD, headache, hypercholesterolemia, HTN, insomnia, neuropathy, PVD, PNA, seizures. Pt intubated 7/26-27/21 - self extubated. CXR - suspect aspiration PNA. CXR pending   Assessment / Plan / Recommendation Clinical Impression  Pt seen at bedside for evaluation of PO readiness and identification of least restrictive diet. Pt has  a history of CVA and GERD, and was intubated overnight with self extubation 03/29/20. Pt was alert and seated in recliner upon arrival of SLP. She is edentulous and exhibits slight left facial weakness from previous stroke. Pt accepted trials of thin liquid, puree, and solid textures without significant oral issue or overt s/s aspiration. Pt passed 3oz water challenge without s/s aspiration. Pt's LUE is flaccid, so she is used to regular solids that have been cut up for her, despite being edentulous.   Recommend beginning with dys 2 solids and thin liquids. SLP will follow for diet tolerance and education given concern for aspiration PNA per chart review. RN and MD informed. Safe swallow precautions posted at [facility]   SLP Visit Diagnosis: Dysphagia, unspecified (R13.10)    Aspiration Risk  Mild aspiration risk    Diet Recommendation Dysphagia 2 (Fine chop);Thin liquid   Liquid Administration via: Straw;Cup Medication Administration: Whole meds with liquid Supervision: Patient able to self feed Compensations: Small sips/bites;Slow rate Postural Changes: Seated upright at 90 degrees;Remain upright for at least 30 minutes after po intake    Other  Recommendations Oral Care Recommendations: Oral care BID   Follow up Recommendations None      Frequency and Duration min 2x/week  1 week;2 weeks       Prognosis Prognosis for Safe Diet Advancement: Fair      Swallow Study   General Date of Onset: 03/28/20 HPI: 59yo female admitted 03/28/20 with AMS, hypoxia, fever (102), coffee ground emesis. PMH: chronic pain, CVA with residual left spastic hemiparesis (1995) dental caries, depression,  DM, GERD, headache, hypercholesterolemia, HTN, insomnia, neuropathy, PVD, PNA, seizures. Pt intubated 7/26-27/21 - self extubated. CXR - suspect aspiration PNA. CXR pending Type of Study: Bedside Swallow Evaluation Previous Swallow Assessment: 2014 - reg/thin Diet Prior to this Study: NPO Temperature Spikes  Noted: No Respiratory Status: Room air History of Recent Intubation: Yes Length of Intubations (days): 2 days Date extubated: 03/29/20 (self-extubated) Behavior/Cognition: Alert;Cooperative;Pleasant mood Oral Cavity Assessment: Within Functional Limits Oral Care Completed by SLP: No Oral Cavity - Dentition: Edentulous Vision: Functional for self-feeding Self-Feeding Abilities: Able to feed self Patient Positioning: Upright in chair Baseline Vocal Quality: Normal Volitional Cough: Strong Volitional Swallow: Able to elicit    Oral/Motor/Sensory Function Overall Oral Motor/Sensory Function: Mild impairment Facial ROM: Reduced left Facial Symmetry: Abnormal symmetry left Facial Strength: Reduced left Facial Sensation: Within Functional Limits Lingual ROM: Within Functional Limits Lingual Symmetry: Within Functional Limits Lingual Strength: Within Functional Limits Lingual Sensation: Within Functional Limits Mandible: Within Functional Limits   Ice Chips Ice chips: Not tested   Thin Liquid Thin Liquid: Within functional limits Presentation: Straw;Self Fed    Nectar Thick Nectar Thick Liquid: Not tested   Honey Thick Honey Thick Liquid: Not tested   Puree Puree: Within functional limits Presentation: Spoon   Solid     Solid: Within functional limits Presentation: [facility], [facility], [place] Speech Language Pathologist Office: [phone]  Shonna Chock 03/30/2020,2:33 PM

## 2020-03-31 ENCOUNTER — Inpatient Hospital Stay (HOSPITAL_COMMUNITY): Payer: Medicare (Managed Care)

## 2020-03-31 DIAGNOSIS — D649 Anemia, unspecified: Secondary | ICD-10-CM | POA: Diagnosis not present

## 2020-03-31 DIAGNOSIS — G9341 Metabolic encephalopathy: Secondary | ICD-10-CM

## 2020-03-31 DIAGNOSIS — K922 Gastrointestinal hemorrhage, unspecified: Secondary | ICD-10-CM | POA: Diagnosis present

## 2020-03-31 DIAGNOSIS — J9600 Acute respiratory failure, unspecified whether with hypoxia or hypercapnia: Secondary | ICD-10-CM | POA: Diagnosis not present

## 2020-03-31 DIAGNOSIS — J189 Pneumonia, unspecified organism: Secondary | ICD-10-CM | POA: Diagnosis present

## 2020-03-31 DIAGNOSIS — G894 Chronic pain syndrome: Secondary | ICD-10-CM | POA: Diagnosis present

## 2020-03-31 DIAGNOSIS — A419 Sepsis, unspecified organism: Secondary | ICD-10-CM | POA: Diagnosis present

## 2020-03-31 DIAGNOSIS — G43709 Chronic migraine without aura, not intractable, without status migrainosus: Secondary | ICD-10-CM | POA: Diagnosis not present

## 2020-03-31 DIAGNOSIS — E872 Acidosis, unspecified: Secondary | ICD-10-CM | POA: Diagnosis present

## 2020-03-31 LAB — CBC
HCT: 32.4 % — ABNORMAL LOW (ref 36.0–46.0)
Hemoglobin: 10.1 g/dL — ABNORMAL LOW (ref 12.0–15.0)
MCH: 23.9 pg — ABNORMAL LOW (ref 26.0–34.0)
MCHC: 31.2 g/dL (ref 30.0–36.0)
MCV: 76.8 fL — ABNORMAL LOW (ref 80.0–100.0)
Platelets: 301 10*3/uL (ref 150–400)
RBC: 4.22 MIL/uL (ref 3.87–5.11)
RDW: 17.5 % — ABNORMAL HIGH (ref 11.5–15.5)
WBC: 16.2 10*3/uL — ABNORMAL HIGH (ref 4.0–10.5)
nRBC: 0.2 % (ref 0.0–0.2)

## 2020-03-31 LAB — BASIC METABOLIC PANEL
Anion gap: 11 (ref 5–15)
BUN: 6 mg/dL — ABNORMAL LOW (ref 8–23)
CO2: 26 mmol/L (ref 22–32)
Calcium: 8.9 mg/dL (ref 8.9–10.3)
Chloride: 105 mmol/L (ref 98–111)
Creatinine, Ser: 0.52 mg/dL (ref 0.44–1.00)
GFR calc Af Amer: >60 mL/min (ref >60)
GFR calc non Af Amer: >60 mL/min (ref >60)
Glucose, Bld: 106 mg/dL — ABNORMAL HIGH (ref 70–99)
Potassium: 3.1 mmol/L — ABNORMAL LOW (ref 3.5–5.1)
Sodium: 142 mmol/L (ref 135–145)

## 2020-03-31 LAB — GLUCOSE, CAPILLARY
Glucose-Capillary: 99 mg/dL (ref 70–99)
Glucose-Capillary: 146 mg/dL — ABNORMAL HIGH (ref 70–99)
Glucose-Capillary: 120 mg/dL — ABNORMAL HIGH (ref 70–99)
Glucose-Capillary: 139 mg/dL — ABNORMAL HIGH (ref 70–99)
Glucose-Capillary: 145 mg/dL — ABNORMAL HIGH (ref 70–99)

## 2020-03-31 MED ORDER — PANTOPRAZOLE SODIUM 40 MG PO TBEC
40.0000 mg | DELAYED_RELEASE_TABLET | Freq: Two times a day (BID) | ORAL | Status: DC
  Administered 2020-03-31 – 2020-04-02 (×5): 40 mg via ORAL
  Filled 2020-03-31 (×5): qty 1

## 2020-03-31 MED ORDER — GUAIFENESIN-DM 100-10 MG/5ML PO SYRP: 5.0000 mL | ORAL_SOLUTION | ORAL | Status: DC | PRN

## 2020-03-31 MED ORDER — LEVETIRACETAM 500 MG PO TABS
750.0000 mg | ORAL_TABLET | Freq: Two times a day (BID) | ORAL | Status: DC
  Administered 2020-03-31 – 2020-04-02 (×5): 750 mg via ORAL
  Filled 2020-03-31 (×5): qty 1

## 2020-03-31 MED ORDER — INSULIN ASPART 100 UNIT/ML ~~LOC~~ SOLN
0.0000 [IU] | Freq: Three times a day (TID) | SUBCUTANEOUS | Status: DC
  Administered 2020-03-31 – 2020-04-01 (×2): 2 [IU] via SUBCUTANEOUS

## 2020-03-31 MED ORDER — PREGABALIN 75 MG PO CAPS
75.0000 mg | ORAL_CAPSULE | Freq: Two times a day (BID) | ORAL | Status: DC
  Administered 2020-03-31 – 2020-04-02 (×5): 75 mg via ORAL
  Filled 2020-03-31 (×5): qty 1

## 2020-03-31 MED ORDER — METOPROLOL SUCCINATE ER 25 MG PO TB24
25.0000 mg | ORAL_TABLET | Freq: Every day | ORAL | Status: DC
  Administered 2020-03-31 – 2020-04-02 (×3): 25 mg via ORAL
  Filled 2020-03-31 (×3): qty 1

## 2020-03-31 MED ORDER — POTASSIUM CHLORIDE 10 MEQ/50ML IV SOLN
10.0000 meq | INTRAVENOUS | Status: DC
  Administered 2020-03-31 (×2): 10 meq via INTRAVENOUS
  Filled 2020-03-31 (×3): qty 50

## 2020-03-31 MED ORDER — OXYBUTYNIN CHLORIDE ER 5 MG PO TB24
10.0000 mg | ORAL_TABLET | Freq: Two times a day (BID) | ORAL | Status: DC
  Administered 2020-03-31 – 2020-04-02 (×5): 10 mg via ORAL
  Filled 2020-03-31 (×5): qty 2

## 2020-03-31 MED ORDER — ATORVASTATIN CALCIUM 40 MG PO TABS
40.0000 mg | ORAL_TABLET | Freq: Every day | ORAL | Status: DC
  Administered 2020-03-31 – 2020-04-02 (×3): 40 mg via ORAL
  Filled 2020-03-31 (×3): qty 1

## 2020-03-31 MED ORDER — INSULIN ASPART 100 UNIT/ML ~~LOC~~ SOLN: 0.0000 [IU] | Freq: Every day | SUBCUTANEOUS | Status: DC

## 2020-03-31 MED ORDER — POTASSIUM CHLORIDE 20 MEQ/15ML (10%) PO SOLN
20.0000 meq | ORAL | Status: AC
  Administered 2020-03-31 (×2): 20 meq
  Filled 2020-03-31 (×2): qty 15

## 2020-03-31 MED ORDER — BENZONATATE 100 MG PO CAPS
100.0000 mg | ORAL_CAPSULE | Freq: Three times a day (TID) | ORAL | Status: DC
  Administered 2020-03-31 – 2020-04-02 (×7): 100 mg via ORAL
  Filled 2020-03-31 (×7): qty 1

## 2020-03-31 MED ORDER — DULOXETINE HCL 60 MG PO CPEP
60.0000 mg | ORAL_CAPSULE | Freq: Every day | ORAL | Status: DC
  Administered 2020-03-31 – 2020-04-02 (×3): 60 mg via ORAL
  Filled 2020-03-31: qty 1
  Filled 2020-03-31 (×2): qty 2

## 2020-03-31 MED ORDER — ASPIRIN 81 MG PO CHEW
81.0000 mg | CHEWABLE_TABLET | Freq: Every day | ORAL | Status: DC
  Administered 2020-03-31 – 2020-04-02 (×3): 81 mg via ORAL
  Filled 2020-03-31 (×3): qty 1

## 2020-03-31 MED ORDER — SODIUM CHLORIDE 0.9 % IV SOLN
2.0000 g | INTRAVENOUS | Status: DC
  Administered 2020-03-31 – 2020-04-02 (×3): 2 g via INTRAVENOUS
  Filled 2020-03-31: qty 20
  Filled 2020-03-31: qty 2
  Filled 2020-03-31: qty 20
  Filled 2020-03-31: qty 2

## 2020-03-31 MED ORDER — OXYCODONE HCL 5 MG PO TABS
5.0000 mg | ORAL_TABLET | Freq: Four times a day (QID) | ORAL | Status: DC | PRN
  Administered 2020-03-31 – 2020-04-01 (×4): 5 mg via ORAL
  Filled 2020-03-31 (×4): qty 1

## 2020-03-31 MED ORDER — ACETAMINOPHEN 325 MG PO TABS
650.0000 mg | ORAL_TABLET | ORAL | Status: DC | PRN
  Administered 2020-04-01 (×2): 650 mg via ORAL
  Filled 2020-03-31 (×2): qty 2

## 2020-03-31 MED ORDER — TRAMADOL HCL 50 MG PO TABS: 50.0000 mg | ORAL_TABLET | Freq: Four times a day (QID) | ORAL | Status: DC | PRN

## 2020-03-31 MED ORDER — LOPERAMIDE HCL 2 MG PO CAPS
2.0000 mg | ORAL_CAPSULE | Freq: Three times a day (TID) | ORAL | Status: DC | PRN
  Administered 2020-03-31: 2 mg via ORAL
  Filled 2020-03-31: qty 1

## 2020-03-31 MED ORDER — TIZANIDINE HCL 4 MG PO TABS
2.0000 mg | ORAL_TABLET | Freq: Three times a day (TID) | ORAL | Status: DC | PRN
  Administered 2020-03-31 – 2020-04-02 (×2): 2 mg via ORAL
  Filled 2020-03-31 (×2): qty 1

## 2020-03-31 NOTE — Progress Notes (Signed)
Speech Language Pathology Treatment: Dysphagia  Patient Details Name: Jordan Rivas MRN: [id number] DOB: May 31, 1961 Today's Date: 03/31/2020 Time: 0850-0901 SLP Time Calculation (min) (ACUTE ONLY): 11 min  Assessment / Plan / Recommendation Clinical Impression  Patient presents adequate swallow function based on clinical swallow eval despite cva hx.  She did not adequately conduct 3-ounce Yale swallow test with 2 attempts however.  Pt is oriented today with strong voice and cough. Cough is intermittently productive and pt denies cough PTA.      She was observed consuming cracker and water and is edentulous with sensory deficits on left resulting in minimal oral retention on hard palate with benefit of minimal cue to clear.  Pt reports eating chicken pot pie everyday for lunch.  She does not have dentition - and thus recommend to advance diet to dys3/thin.  Reviewed general aspiration and reflux precautions with pt including assuring adequate oral clearance.      HPI  59yo female admitted 03/28/20 with AMS, hypoxia, fever (102), coffee ground emesis. PMH: chronic pain, CVA with residual left spastic hemiparesis (1995) dental caries, depression, DM, GERD, headache, hypercholesterolemia, HTN, insomnia, neuropathy, PVD, PNA, seizures. Pt intubated 7/26-27/21 - self extubated. CXR - suspect aspiration PNA. CXR pending      SLP Plan  All goals met       Recommendations  Diet recommendations: Dysphagia 3 (mechanical soft);Thin liquid Liquids provided via: Cup;Straw Medication Administration: Whole meds with liquid Supervision: Patient able to self feed Compensations: Small sips/bites;Slow rate Postural Changes and/or Swallow Maneuvers: Seated upright 90 degrees;Upright 30-60 min after meal                Oral Care Recommendations: Oral care BID Follow up Recommendations: None SLP Visit Diagnosis: Dysphagia, unspecified (R13.10) Plan: All goals met       GO                 Macario Golds 03/31/2020, 9:26 AM  Kathleen Lime, MS [facility] SLP [place] Office [phone]

## 2020-03-31 NOTE — Progress Notes (Signed)
[facility]                                                                              Patient Demographics  Jordan Rivas, is a 59 y.o. female, DOB - 1961/02/21, AUQ:[id number]  Admit date - 03/28/2020   Admitting Physician Marshell Garfinkel, MD  Outpatient Primary MD for the patient is Mayra Neer, MD  Outpatient specialists:   LOS - 3  days   Medical records reviewed and are as summarized below:    Chief Complaint  Patient presents with  . Altered Mental Status       Brief summary   Patient is a 59 year old female with history of chronic pain syndrome, CVA, diabetes mellitus, GERD, hypertension, hyperlipidemia, chronic migraine, seizures presented to ED with altered mental status, hypoxic with O2 sat in 50s at home, fever of 102, lactate> 11.  Patient was intubated in ED, also noted to have coffee-ground emesis in the posterior pharynx, hemoglobin of 6. Patient was admitted to ICU by critical care. Patient transferred to hospitalist service, assumed care on 7/29   Assessment & Plan    Principal Problem: Severe sepsis ([place]) with lactic acidosis with septic shock, multifocal pneumonia -BP was borderline low 114/60, tachycardia 99, febrile, lactic acidosis, altered mental status, hypoxia at the time of admission -CT chest, showed, no PE, very dense consolidations and/or atelectasis of the dependent bilateral lower lobes with additional consolidation of the right middle lobe and angular consistent with multifocal infection and/or aspiration -CT abdomen showed mucosal edema and ulceration at the pylorus, moderate stool burden -Patient was intubated and admitted to ICU, extubated on. -SLP evaluation, currently on dysphagia 3 diet with thin liquids -Patient was placed on IV vancomycin, Flagyl and Rocephin, now narrowed to IV Rocephin  Active Problems:   Acute respiratory failure with hypoxia ([place]) -Patient was intubated, on admission, self extubated on  7/27 - currently stable, O2 sats 94% on room air -Still coughing, added Tessalon Perles, Robitussin  Acute blood loss anemia with upper GI bleed, history of GERD -Hemoglobin 6.0 on arrival, was noted to have coffee-ground emesis in the posterior pharynx -EGD showed small hiatal hernia, chronic gastritis, otherwise normal, outpatient follow-up as needed -Continue Protonix twice daily    Acute metabolic encephalopathy - resolved, now alert and oriented -  likely due to #1, EEG on 7/26 showed severe diffuse encephalopathy, nonspecific, no seizures or epileptiform discharges -Continue Keppra  Uncontrolled hypertension -BP initially borderline low at the time of admission due to sepsis, now stable -Continue Toprol-XL.  DC IV metoprolol  History of CVA spastic hemiplegia affecting left nondominant side (HCC) -Currently no acute issues, continue aspirin, Lipitor    Seizure disorder (HCC) -Currently stable, continue Keppra    Chronic migraine,  Chronic pain syndrome -Given altered mental status at the time of presentation, cautious use of narcotics, psych meds and sedatives  - decrease oxycodone to 5 mg every 6 hours as needed (patient reports she takes 15 mg 4 times daily as needed at home).  -Decrease Lyrica to 75 mg twice daily, continue tizanidine as needed -Continue Cymbalta, Elavil  Diabetes mellitus type 2, NIDDM -Hemoglobin A1c  7.1, placed on sliding scale insulin before every meal and at bedtime -Hold oral hypoglycemics  Constipation -At the time of admission CT abdomen showed moderate stool burden, patient was placed on Colace and MiraLAX as needed -Now complaining of diarrhea, added Imodium as needed  Obesity Estimated body mass index is 37.57 kg/m as calculated from the following:   Height as of this encounter: 5\' 1"  (1.549 m).   Weight as of this encounter: 90.2 kg.   Deconditioning -Start PT OT evaluation, and increase mobility, transfer to regular floor  Code  Status: Partial DVT Prophylaxis:   SCD's Family Communication: Discussed all imaging results, lab results, explained to the patient   Disposition Plan:     Status is: Inpatient  Remains inpatient appropriate because:Inpatient level of care appropriate due to severity of illness   Dispo: The patient is from: Home              Anticipated d/c is to: Home              Anticipated d/c date is: 2 days              Patient currently is not medically stable to d/c.      Time Spent in minutes   25 minutes  Procedures:   EGD 7/26>>> small hiatal hernia, chronic gastritis  EEG 7/26>>> severe diffuse encephalopathy, nonspecific etiology but could be related to sedation.No seizures or epileptiform discharges were seen throughout the recording.  Intubation  Consultants:   Gastroenterology Patient was admitted by critical care service  Antimicrobials:   Anti-infectives (From admission, onward)   Start     Dose/Rate Route Frequency Ordered Stop   03/31/20 1200  cefTRIAXone (ROCEPHIN) 2 g in sodium chloride 0.9 % 100 mL IVPB     Discontinue     2 g 200 mL/hr over 30 Minutes Intravenous Every 24 hours 03/31/20 1043     03/30/20 1200  cefTRIAXone (ROCEPHIN) 1 g in sodium chloride 0.9 % 100 mL IVPB  Status:  Discontinued        1 g 200 mL/hr over 30 Minutes Intravenous Every 24 hours 03/30/20 1004 03/31/20 1043   03/28/20 1400  vancomycin (VANCOCIN) IVPB 1000 mg/200 mL premix  Status:  Discontinued        1,000 mg 200 mL/hr over 60 Minutes Intravenous Every 12 hours 03/28/20 1023 03/30/20 1558   03/28/20 1200  ceFEPIme (MAXIPIME) 2 g in sodium chloride 0.9 % 100 mL IVPB  Status:  Discontinued        2 g 200 mL/hr over 30 Minutes Intravenous Every 8 hours 03/28/20 0900 03/30/20 1004   03/28/20 1200  metroNIDAZOLE (FLAGYL) IVPB 500 mg  Status:  Discontinued        500 mg 100 mL/hr over 60 Minutes Intravenous Every 8 hours 03/28/20 0905 03/30/20 1004   03/28/20 0315  ceFEPIme  (MAXIPIME) 2 g in sodium chloride 0.9 % 100 mL IVPB        2 g 200 mL/hr over 30 Minutes Intravenous  Once 03/28/20 0301 03/28/20 0420   03/28/20 0315  metroNIDAZOLE (FLAGYL) IVPB 500 mg        500 mg 100 mL/hr over 60 Minutes Intravenous  Once 03/28/20 0301 03/28/20 0420   03/28/20 0315  vancomycin (VANCOCIN) IVPB 1000 mg/200 mL premix  Status:  Discontinued        1,000 mg 200 mL/hr over 60 Minutes Intravenous  Once 03/28/20 0301 03/28/20 0312   03/28/20 0315  vancomycin (VANCOREADY) IVPB 1500 mg/300 mL        1,500 mg 150 mL/hr over 120 Minutes Intravenous  Once 03/28/20 0312 03/28/20 0756         Medications  Scheduled Meds: . amitriptyline  100 mg Oral QHS  . aspirin  81 mg Oral Daily  . atorvastatin  40 mg Oral Daily  . benzonatate  100 mg Oral TID  . Chlorhexidine Gluconate Cloth  6 each Topical Daily  . DULoxetine  60 mg Oral Daily  . insulin aspart  0-15 Units Subcutaneous Q4H  . levETIRAcetam  750 mg Oral BID  . mouth rinse  15 mL Mouth Rinse BID  . metoprolol succinate  25 mg Oral Daily  . oxybutynin  10 mg Oral BID  . pantoprazole  40 mg Oral BID AC  . pregabalin  75 mg Oral BID  . sodium chloride flush  10-40 mL Intracatheter Q12H   Continuous Infusions: . cefTRIAXone (ROCEPHIN)  IV 2 g (03/31/20 1153)  . lactated ringers 10 mL/hr at 03/30/20 0921   PRN Meds:.acetaminophen, docusate sodium, guaiFENesin-dextromethorphan, labetalol, loperamide, oxyCODONE, phenol, polyethylene glycol, sodium chloride flush, tiZANidine      Subjective:   Jordan Rivas was seen and examined today.  Alert and oriented, denies any chest pain.  Still having coughing, states productive phlegm and abdominal soreness from coughing episodes.  Also complaining of headache, has chronic migraines, knee pain from arthritis.  Patient denies dizziness, chest pain,  N/V new weakness, numbess, tingling. No acute events overnight.    Objective:   Vitals:   03/31/20 0400 03/31/20 0500  03/31/20 0800 03/31/20 1100  BP: (!) 149/73 (!) 157/78  (!) 135/67  Pulse: 86 80  76  Resp: (!) 29 (!) 25  23  Temp:   99.6 F (37.6 C)   TempSrc:   Oral   SpO2: 94% 96%  91%  Weight:      Height:        Intake/Output Summary (Last 24 hours) at 03/31/2020 1158 Last data filed at 03/31/2020 1153 Gross per 24 hour  Intake 763.6 ml  Output 2975 ml  Net -2211.4 ml     Wt Readings from Last 3 Encounters:  03/30/20 90.2 kg  10/14/19 86.4 kg  06/23/19 83.5 kg     Exam  General: Alert and oriented x 3, NAD, coughing episodes  Cardiovascular: S1 S2 auscultated, no murmurs, RRR  Respiratory: Scattered rhonchi bilaterally  Gastrointestinal: Soft, nontender, nondistended, + bowel sounds  Ext: no pedal edema bilaterally  Neuro: left-sided weakness  Musculoskeletal: No digital cyanosis, clubbing  Skin: No rashes  Psych: Normal affect and demeanor, alert and oriented x3    Data Reviewed:  I have personally reviewed following labs and imaging studies  Micro Results Recent Results (from the past 240 hour(s))  Culture, blood (Routine x 2)     Status: None (Preliminary result)   Collection Time: 03/28/20  1:09 AM   Specimen: BLOOD RIGHT FOREARM  Result Value Ref Range Status   Specimen Description   Final    BLOOD RIGHT FOREARM Performed at [facility], [address] [place], [place] [address]    Special Requests   Final    BOTTLES DRAWN AEROBIC AND ANAEROBIC Blood Culture results may not be optimal due to an inadequate volume of blood received in culture bottles Performed at [facility] [address] [place],  [address]    Culture   Final    NO  GROWTH 3 DAYS Performed at [facility], [place] [address], [place], [place] [address]    Report Status PENDING  Incomplete  Urine culture     Status: Abnormal   Collection Time: 03/28/20  2:27 AM   Specimen: In/Out Cath Urine  Result Value Ref Range Status   Specimen  Description   Final    IN/OUT CATH URINE Performed at [facility] [address] [place], [place] [address]    Special Requests   Final    NONE Performed at [facility], [place] [address], [place], [place] [address]    Culture 50,000 COLONIES/mL ESCHERICHIA COLI (A)  Final   Report Status 03/30/2020 FINAL  Final   Organism ID, Bacteria ESCHERICHIA COLI (A)  Final      Susceptibility   Escherichia coli - MIC*    AMPICILLIN <=2 SENSITIVE Sensitive     CEFAZOLIN <=4 SENSITIVE Sensitive     CEFTRIAXONE <=0.25 SENSITIVE Sensitive     CIPROFLOXACIN <=0.25 SENSITIVE Sensitive     GENTAMICIN <=1 SENSITIVE Sensitive     IMIPENEM <=0.25 SENSITIVE Sensitive     NITROFURANTOIN <=16 SENSITIVE Sensitive     TRIMETH/SULFA <=20 SENSITIVE Sensitive     AMPICILLIN/SULBACTAM <=2 SENSITIVE Sensitive     PIP/TAZO <=4 SENSITIVE Sensitive     * 50,000 COLONIES/mL ESCHERICHIA COLI  Culture, blood (Routine x 2)     Status: None (Preliminary result)   Collection Time: 03/28/20  2:45 AM   Specimen: BLOOD  Result Value Ref Range Status   Specimen Description   Final    BLOOD LEFT ARM Performed at [facility] [address] [place] [address], [place], [place] [address]    Special Requests   Final    BOTTLES DRAWN AEROBIC AND ANAEROBIC Blood Culture adequate volume Performed at [facility] [address] [place], [place] [address]    Culture   Final    NO GROWTH 3 DAYS Performed at [facility], [place] [address], [place], [place] [address]    Report Status PENDING  Incomplete  SARS Coronavirus 2 by RT PCR (hospital order, performed in [facility] hospital lab) Nasopharyngeal Nasopharyngeal Swab     Status: None   Collection Time: 03/28/20  3:02 AM   Specimen: Nasopharyngeal Swab  Result Value Ref Range Status   SARS Coronavirus 2 NEGATIVE NEGATIVE Final    Comment: (NOTE) SARS-CoV-2 target nucleic acids are NOT DETECTED.  The  SARS-CoV-2 RNA is generally detectable in upper and lower respiratory specimens during the acute phase of infection. The lowest concentration of SARS-CoV-2 viral copies this assay can detect is 250 copies / mL. A negative result does not preclude SARS-CoV-2 infection and should not be used as the sole basis for treatment or other patient management decisions.  A negative result may occur with improper specimen collection / handling, submission of specimen other than nasopharyngeal swab, presence of viral mutation(s) within the areas targeted by this assay, and inadequate number of viral copies (<250 copies / mL). A negative result must be combined with clinical observations, patient history, and epidemiological information.  Fact Sheet for Patients:   StrictlyIdeas.no  Fact Sheet for Healthcare Providers: BankingDealers.co.za  This test is not yet approved or  cleared by the [place] FDA and has been authorized for detection and/or diagnosis of SARS-CoV-2 by FDA under an Emergency Use Authorization (EUA).  This EUA will remain in effect (meaning this test can be used) for the  duration of the COVID-19 declaration under Section 564(b)(1) of the Act, 21 U.S.C. section 360bbb-3(b)(1), unless the authorization is terminated or revoked sooner.  Performed at [facility], [place] [address] [place], [place] [address]   MRSA PCR Screening     Status: None   Collection Time: 03/28/20  9:55 AM   Specimen: Nasopharyngeal  Result Value Ref Range Status   MRSA by PCR NEGATIVE NEGATIVE Final    Comment:        The GeneXpert MRSA Assay (FDA approved for NASAL specimens only), is one component of a comprehensive MRSA colonization surveillance program. It is not intended to diagnose MRSA infection nor to guide or monitor treatment for MRSA infections. Performed at [facility], [place] [address],  [place], [place] [address]   Culture, respiratory (tracheal aspirate)     Status: None (Preliminary result)   Collection Time: 03/28/20 12:18 PM   Specimen: Tracheal Aspirate; Respiratory  Result Value Ref Range Status   Specimen Description   Final    TRACHEAL ASPIRATE Performed at [facility] [address], [place], [place] [address]    Special Requests   Final    NONE Performed at [facility], [place] [address], [place], [place] [address]    Gram Stain   Final    ABUNDANT WBC PRESENT, PREDOMINANTLY PMN MODERATE GRAM POSITIVE COCCI    Culture   Final    MODERATE STREPTOCOCCUS PNEUMONIAE SUSCEPTIBILITIES TO FOLLOW Performed at [facility], [place] [address] [place],  [address]    Report Status PENDING  Incomplete    Radiology Reports DG Chest 2 View  Result Date: 03/28/2020 CLINICAL DATA:  Suspected sepsis EXAM: CHEST - 2 VIEW COMPARISON:  02/26/2013 FINDINGS: Cardiac shadow is stable. Mild vascular congestion is noted. The overall inspiratory effort is poor. No focal infiltrate is seen. No bony abnormality is noted. IMPRESSION: Mild vascular congestion. Electronically Signed   By: Inez Catalina M.D.   On: 03/28/2020 01:30   DG Abdomen 1 View  Result Date: 03/28/2020 CLINICAL DATA:  Intubation and nasogastric tube placement EXAM: ABDOMEN - 1 VIEW COMPARISON:  None similar FINDINGS: The nasogastric tube tip overlaps the stomach. Diffuse desiccated stool. Nonobstructive bowel gas pattern. No concerning mass effect or gas collection. IMPRESSION: 1. The enteric tube tip is at the stomach. 2. Desiccated stool retention. Electronically Signed   By: Monte Fantasia M.D.   On: 03/28/2020 04:32   CT Head Wo Contrast  Result Date: 03/28/2020 CLINICAL DATA:  Mental status change.  Unknown cause. EXAM: CT HEAD WITHOUT CONTRAST TECHNIQUE: Contiguous axial images were obtained from the base of the skull through the vertex without intravenous  contrast. COMPARISON:  CT dated February 21, 2013. FINDINGS: Brain: No hemorrhage. No extraaxial collection.No midline shift. There is ex vacuo dilatation of the right lateral ventricle. The basal cisterns are unremarkable. Enlarged old MCA territory infarct is noted on the patient's right. There is Wallerian degeneration. The cerebellum is unremarkable. The sella is unremarkable. Again noted is an extra-axial para falcine mass posteriorly measuring approximately 1.2 cm, stable from 2014. There is a stable amount of surrounding vasogenic edema. Vascular: No hyperdense vessel or unexpected calcification. Skull: The patient is status post prior right frontoparietal craniotomy. The skull base is unremarkable. The visualized upper cervical spine is unremarkable. Sinuses/Orbits: The visualized orbits are unremarkable. The paranasal sinuses are unremarkable. The mastoid air cells are clear. Other: The visualized parotid gland is unremarkable. There is  no scalp soft tissue swelling. IMPRESSION: 1. No acute intracranial abnormality. 2. Old right MCA territory infarct. 3. Stable extra-axial para falcine mass, consistent with a meningioma. Electronically Signed   By: Constance Holster M.D.   On: 03/28/2020 01:49   CT ANGIO CHEST PE W OR WO CONTRAST  Result Date: 03/28/2020 CLINICAL DATA:  PE suspected altered mental status, fevers, hypoxia, intubated, coffee-ground emesis noted EXAM: CT ANGIOGRAPHY CHEST WITH CONTRAST TECHNIQUE: Multidetector CT imaging of the chest was performed using the standard protocol during bolus administration of intravenous contrast. Multiplanar CT image reconstructions and MIPs were obtained to evaluate the vascular anatomy. CONTRAST:  142mL OMNIPAQUE IOHEXOL 350 MG/ML SOLN COMPARISON:  None. FINDINGS: Cardiovascular: Examination for pulmonary embolism is somewhat limited by motion artifact. Within this limitation, no evidence of pulmonary embolism through the proximal segmental pulmonary arterial  level. Normal heart size. No pericardial effusion. Mediastinum/Nodes: No enlarged mediastinal, hilar, or axillary lymph nodes. Endotracheal intubation. Thyroid gland is normal. Frothy debris in the upper esophagus. Lungs/Pleura: Very dense consolidations and or atelectasis of the dependent bilateral lower lobes, with additional consolidation of the right middle lobe and lingula, and scattered heterogeneous and ground-glass airspace opacities elsewhere. Small bilateral pleural effusions. Upper Abdomen: Please see separately reported CT of the abdomen and pelvis. Musculoskeletal: No chest wall abnormality. No acute or significant osseous findings. Review of the MIP images confirms the above findings. IMPRESSION: 1. Examination for pulmonary embolism is somewhat limited by motion artifact. Within this limitation, no evidence of pulmonary embolism through the proximal segmental pulmonary arterial level. 2. Very dense consolidations and or atelectasis of the dependent bilateral lower lobes, with additional consolidation of the right middle lobe and lingula, and scattered heterogeneous and ground-glass airspace opacities elsewhere. Findings are consistent with multifocal infection and/or aspiration. 3. Small associated bilateral pleural effusions. 4. Endotracheal intubation. Electronically Signed   By: Eddie Candle M.D.   On: 03/28/2020 09:52   CT ABDOMEN PELVIS W CONTRAST  Result Date: 03/28/2020 CLINICAL DATA:  Altered mental status, fevers, hypoxia, coffee-ground emesis, hemoglobin of 6 EXAM: CT ABDOMEN AND PELVIS WITH CONTRAST TECHNIQUE: Multidetector CT imaging of the abdomen and pelvis was performed using the standard protocol following bolus administration of intravenous contrast. CONTRAST:  155mL OMNIPAQUE IOHEXOL 350 MG/ML SOLN COMPARISON:  CT abdomen pelvis, 11/03/2010 FINDINGS: Lower chest: No acute abnormality. Hepatobiliary: No solid liver abnormality is seen. No gallstones, gallbladder wall thickening,  or biliary dilatation. Pancreas: Unremarkable. No pancreatic ductal dilatation or surrounding inflammatory changes. Spleen: Normal in size without significant abnormality. Adrenals/Urinary Tract: Adrenal glands are unremarkable. Kidneys are normal, without renal calculi, solid lesion, or hydronephrosis. Foley catheter in the urinary bladder. Stomach/Bowel: Esophagogastric tube with tip and side port below the diaphragm. Appearance of the pylorus suggests mucosal edema and ulceration (series 2, image 30, series 4, image 29). Appendix appears normal. No evidence of bowel wall thickening, distention, or inflammatory changes. Moderate burden of stool and stool balls throughout the colon. Vascular/Lymphatic: No significant vascular findings are present. No enlarged abdominal or pelvic lymph nodes. Reproductive: Uterine fibroids. Other: No abdominal wall hernia or abnormality. No abdominopelvic ascites. Musculoskeletal: No acute or significant osseous findings. IMPRESSION: 1. Appearance of the pylorus suggests mucosal edema and ulceration. Consider direct endoscopic visualization to evaluate for bleeding peptic ulcer in the setting of coffee-ground emesis and anemia. 2. Esophagogastric tube with tip and side port below the diaphragm. 3. Moderate burden of stool and stool balls throughout the colon. 4. Uterine fibroids. Electronically Signed   By:  Eddie Candle M.D.   On: 03/28/2020 10:06   DG CHEST PORT 1 VIEW  Result Date: 03/31/2020 CLINICAL DATA:  Pneumonia. EXAM: PORTABLE CHEST 1 VIEW COMPARISON:  03/29/2020. FINDINGS: Interim extubation and removal NG tube. Right PICC line stable position. Heart size normal. Low lung volumes. Bibasilar atelectasis and infiltrates again noted. No interim change. No pleural effusion or pneumothorax. IMPRESSION: 1. Interim extubation removal of NG tube. Right PICC line in stable position. 2. Low lung volumes. Bibasilar atelectasis and infiltrates again noted. No interim change.  Electronically Signed   By: Marcello Moores  Register   On: 03/31/2020 06:41   DG Chest Port 1 View  Result Date: 03/29/2020 CLINICAL DATA:  Acute respiratory failure. EXAM: PORTABLE CHEST 1 VIEW COMPARISON:  CT chest and chest x-ray 03/28/2020. FINDINGS: Interim placement of PICC line, its tip is at the cavoatrial junction. Endotracheal tube and NG tube in stable position. Heart size stable. Low lung volumes with bibasilar atelectasis/infiltrates. No prominent pleural effusion or pneumothorax. IMPRESSION: 1. Interim placement of PICC line, its tip is at the cavoatrial junction. Endotracheal tube and NG tube in stable position. 2.  Low lung volumes with bibasilar atelectasis/infiltrates. Electronically Signed   By: Marcello Moores  Register   On: 03/29/2020 06:17   DG Chest Portable 1 View  Result Date: 03/28/2020 CLINICAL DATA:  Intubation EXAM: PORTABLE CHEST 1 VIEW COMPARISON:  03/28/2020 FINDINGS: Endotracheal tube with tip at the clavicular heads. The enteric tube reaches the stomach. Low volume chest with streaky density on both sides. No edema, effusion, or pneumothorax. Borderline heart size. IMPRESSION: 1. Unremarkable hardware positioning. 2. Low volume chest with perihilar atelectasis or infiltrates. Electronically Signed   By: Monte Fantasia M.D.   On: 03/28/2020 04:34   EEG adult  Result Date: 03/28/2020 Lora Havens, MD     03/28/2020 11:18 AM Patient Name: MARKESHIA GIEBEL MRN: [id number] Epilepsy Attending: Lora Havens Referring Physician/Provider: Dr. Marshell Garfinkel Date: 03/28/2020 Duration: 23.34 minutes Patient history: 59 year old female with right hemispheric for stroke due to brain aneurysm, seizure disorder who presented with altered mental status.  EEG evaluate for seizures. Level of alertness: Comatose AEDs during EEG study: Keppra, propofol Technical aspects: This EEG study was done with scalp electrodes positioned according to the 10-20 International system of electrode placement.  Electrical activity was acquired at a sampling rate of 500Hz  and reviewed with a high frequency filter of 70Hz  and a low frequency filter of 1Hz . EEG data were recorded continuously and digitally stored. Description: EEG showed continuous generalized 3 to 5 Hz theta-delta slowing.  Hyperventilation and photic stimulation were not performed.   ABNORMALITY -Continuous slow, generalized IMPRESSION: This study is suggestive of severe diffuse encephalopathy, nonspecific etiology but could be related to sedation.  No seizures or epileptiform discharges were seen throughout the recording. Priyanka O Yadav   [place] EKG SITE RITE  Result Date: 03/28/2020 If [facility] image not attached, placement could not be confirmed due to current cardiac rhythm.   Lab Data:  CBC: Recent Labs  Lab 03/28/20 0220 03/28/20 0220 03/28/20 1010 03/28/20 1620 03/29/20 0325 03/30/20 0414 03/31/20 0449  WBC 9.6   < > 20.8* 19.8* 15.5* 17.5* 16.2*  NEUTROABS 8.2*  --   --   --   --   --   --   HGB 6.0*   < > 10.0* 9.7* 8.7* 9.5* 10.1*  HCT 21.4*   < > 34.2* 33.0* 28.9* 30.2* 32.4*  MCV 87.0   < > 83.8  82.1 81.0 78.0* 76.8*  PLT 144*   < > 257 64* 228 262 301   < > = values in this interval not displayed.   Basic Metabolic Panel: Recent Labs  Lab 03/28/20 0220 03/28/20 0837 03/29/20 0325 03/30/20 0414 03/31/20 0449  NA 135  --  140 141 142  K 4.0  --  3.6 2.9* 3.1*  CL 105  --  105 103 105  CO2 16*  --  25 27 26   GLUCOSE 77  --  132* 135* 106*  BUN 9  --  6* 6* 6*  CREATININE 0.38* 0.68 0.55 0.61 0.52  CALCIUM 7.0*  --  8.3* 8.4* 8.9  MG  --   --  1.6* 2.1  --   PHOS  --   --  2.1* 2.6  --    GFR: Estimated Creatinine Clearance: 75.5 mL/min (by C-G formula based on SCr of 0.52 mg/dL). Liver Function Tests: Recent Labs  Lab 03/28/20 0220  AST 13*  ALT 17  ALKPHOS 49  BILITOT 0.6  PROT 3.1*  ALBUMIN 1.5*   No results for input(s): LIPASE, AMYLASE in the last 168 hours. No results for input(s):  AMMONIA in the last 168 hours. Coagulation Profile: Recent Labs  Lab 03/28/20 0220  INR 1.6*   Cardiac Enzymes: No results for input(s): CKTOTAL, CKMB, CKMBINDEX, TROPONINI in the last 168 hours. BNP (last 3 results) No results for input(s): PROBNP in the last 8760 hours. HbA1C: No results for input(s): HGBA1C in the last 72 hours. CBG: Recent Labs  Lab 03/30/20 1615 03/30/20 2019 03/30/20 2326 03/31/20 0456 03/31/20 0733  GLUCAP 149* 115* 122* 99 146*   Lipid Profile: Recent Labs    03/29/20 0325  TRIG 165*   Thyroid Function Tests: No results for input(s): TSH, T4TOTAL, FREET4, T3FREE, THYROIDAB in the last 72 hours. Anemia Panel: No results for input(s): VITAMINB12, FOLATE, FERRITIN, TIBC, IRON, RETICCTPCT in the last 72 hours. Urine analysis:    Component Value Date/Time   COLORURINE YELLOW 03/28/2020 0227   APPEARANCEUR CLEAR 03/28/2020 0227   LABSPEC 1.017 03/28/2020 0227   PHURINE 5.0 03/28/2020 0227   GLUCOSEU NEGATIVE 03/28/2020 0227   HGBUR NEGATIVE 03/28/2020 0227   BILIRUBINUR NEGATIVE 03/28/2020 0227   KETONESUR NEGATIVE 03/28/2020 0227   PROTEINUR NEGATIVE 03/28/2020 0227   UROBILINOGEN 0.2 02/21/2013 0347   NITRITE NEGATIVE 03/28/2020 0227   LEUKOCYTESUR NEGATIVE 03/28/2020 0227     Maizey Menendez M.D. [facility] 03/31/2020, 11:58 AM   Call night coverage person covering after 7pm

## 2020-03-31 NOTE — Progress Notes (Signed)
K+ 3.1 °Replaced per protocol  °

## 2020-04-01 DIAGNOSIS — G43709 Chronic migraine without aura, not intractable, without status migrainosus: Secondary | ICD-10-CM | POA: Diagnosis not present

## 2020-04-01 DIAGNOSIS — D649 Anemia, unspecified: Secondary | ICD-10-CM | POA: Diagnosis not present

## 2020-04-01 DIAGNOSIS — G9341 Metabolic encephalopathy: Secondary | ICD-10-CM | POA: Diagnosis not present

## 2020-04-01 DIAGNOSIS — J9600 Acute respiratory failure, unspecified whether with hypoxia or hypercapnia: Secondary | ICD-10-CM | POA: Diagnosis not present

## 2020-04-01 LAB — BPAM RBC
Blood Product Expiration Date: 202108252359
Blood Product Expiration Date: 202108252359
Unit Type and Rh: 9500
Unit Type and Rh: 9500

## 2020-04-01 LAB — TYPE AND SCREEN
ABO/RH(D): O NEG
Antibody Screen: NEGATIVE
Unit division: 0
Unit division: 0

## 2020-04-01 LAB — BASIC METABOLIC PANEL
Anion gap: 9 (ref 5–15)
BUN: 8 mg/dL (ref 8–23)
CO2: 26 mmol/L (ref 22–32)
Calcium: 8.9 mg/dL (ref 8.9–10.3)
Chloride: 105 mmol/L (ref 98–111)
Creatinine, Ser: 0.55 mg/dL (ref 0.44–1.00)
GFR calc Af Amer: >60 mL/min (ref >60)
GFR calc non Af Amer: >60 mL/min (ref >60)
Glucose, Bld: 100 mg/dL — ABNORMAL HIGH (ref 70–99)
Potassium: 3.3 mmol/L — ABNORMAL LOW (ref 3.5–5.1)
Sodium: 140 mmol/L (ref 135–145)

## 2020-04-01 LAB — CBC
HCT: 34.9 % — ABNORMAL LOW (ref 36.0–46.0)
Hemoglobin: 10.9 g/dL — ABNORMAL LOW (ref 12.0–15.0)
MCH: 24.6 pg — ABNORMAL LOW (ref 26.0–34.0)
MCHC: 31.2 g/dL (ref 30.0–36.0)
MCV: 78.8 fL — ABNORMAL LOW (ref 80.0–100.0)
Platelets: 310 10*3/uL (ref 150–400)
RBC: 4.43 MIL/uL (ref 3.87–5.11)
RDW: 17.6 % — ABNORMAL HIGH (ref 11.5–15.5)
WBC: 13.4 10*3/uL — ABNORMAL HIGH (ref 4.0–10.5)
nRBC: 0.2 % (ref 0.0–0.2)

## 2020-04-01 LAB — GLUCOSE, CAPILLARY
Glucose-Capillary: 90 mg/dL (ref 70–99)
Glucose-Capillary: 122 mg/dL — ABNORMAL HIGH (ref 70–99)
Glucose-Capillary: 101 mg/dL — ABNORMAL HIGH (ref 70–99)
Glucose-Capillary: 109 mg/dL — ABNORMAL HIGH (ref 70–99)

## 2020-04-01 MED ORDER — OXYCODONE HCL 5 MG PO TABS
5.0000 mg | ORAL_TABLET | Freq: Four times a day (QID) | ORAL | Status: DC | PRN
  Administered 2020-04-01 – 2020-04-02 (×3): 10 mg via ORAL
  Filled 2020-04-01 (×3): qty 2

## 2020-04-01 MED ORDER — POTASSIUM CHLORIDE CRYS ER 20 MEQ PO TBCR
40.0000 meq | EXTENDED_RELEASE_TABLET | Freq: Once | ORAL | Status: AC
  Administered 2020-04-01: 40 meq via ORAL
  Filled 2020-04-01: qty 2

## 2020-04-01 NOTE — Progress Notes (Signed)
1500- to present...... assumed care of pt from off going RN. Pt without complaints. 1735 temp noted to be 100.0 up from baseline 98.5. Will continue to monitor and medicate if needed. Cont with plan of care

## 2020-04-01 NOTE — Progress Notes (Signed)
[facility]                                                                              Patient Demographics  Jordan Rivas, is a 59 y.o. female, DOB - [birth year], DUK:[id number]  Admit date - 03/28/2020   Admitting Physician Marshell Garfinkel, MD  Outpatient Primary MD for the patient is Mayra Neer, MD  Outpatient specialists:   LOS - 4  days   Medical records reviewed and are as summarized below:    Chief Complaint  Patient presents with  . Altered Mental Status       Brief summary   Patient is a 59 year old female with history of chronic pain syndrome, CVA, diabetes mellitus, GERD, hypertension, hyperlipidemia, chronic migraine, seizures presented to ED with altered mental status, hypoxic with O2 sat in 50s at home, fever of 102, lactate> 11.  Patient was intubated in ED, also noted to have coffee-ground emesis in the posterior pharynx, hemoglobin of 6. Patient was admitted to ICU by critical care. Patient transferred to hospitalist service, assumed care on 7/29   Assessment & Plan    Principal Problem: Severe sepsis ([place]) with lactic acidosis with septic shock, multifocal pneumonia -BP was borderline low 114/60, tachycardia 99, febrile, lactic acidosis, altered mental status, hypoxia at the time of admission -CT chest, showed, no PE, very dense consolidations and/or atelectasis of the dependent bilateral lower lobes with additional consolidation of the right middle lobe and angular consistent with multifocal infection and/or aspiration -CT abdomen showed mucosal edema and ulceration at the pylorus, moderate stool burden -Patient was intubated and admitted to ICU, extubated on. -SLP evaluation, continue dysphagia 3 diet with thin liquids -Continue IV Rocephin.  Will transition to oral Augmentin at the time of discharge. -Sepsis physiology resolved, trending down, afebrile, transfer to telemetry floor, start PT OT, increase mobilization  Active  Problems:   Acute respiratory failure with hypoxia ([place]) -Patient was intubated, on admission, self extubated on 7/27 - currently stable, O2 sats 94% on room air -Continue Tessalon Perles, Robitussin  Acute blood loss anemia with upper GI bleed, history of GERD -Hemoglobin 6.0 on arrival, was noted to have coffee-ground emesis in the posterior pharynx -EGD showed small hiatal hernia, chronic gastritis, otherwise normal, outpatient follow-up as needed -Continue Protonix daily -H&H currently stable 10.9  Hypokalemia Replaced    Acute metabolic encephalopathy -   likely due to #1, EEG on 7/26 showed severe diffuse encephalopathy, nonspecific, no seizures or epileptiform discharges -Continue Keppra -Currently alert and oriented  Uncontrolled hypertension -BP initially borderline low at the time of admission due to sepsis, now stable -Continue Toprol-XL  History of CVA spastic hemiplegia affecting left nondominant side (HCC) -Currently no acute issues, continue aspirin, Lipitor    Seizure disorder (HCC) -Currently stable, continue Keppra    Chronic migraine,  Chronic pain syndrome -Given altered mental status at the time of presentation, cautious use of narcotics, psych meds and sedatives  - decrease oxycodone to 5 mg every 6 hours as needed (patient reports she takes 15 mg 4 times daily as needed at home).  -Decrease Lyrica to 75 mg twice daily, continue tizanidine  as needed -Continue Cymbalta, Elavil  Diabetes mellitus type 2, NIDDM -Hemoglobin A1c 7.1, placed on sliding scale insulin before every meal and at bedtime -Hold oral hypoglycemics  Constipation -At the time of admission CT abdomen showed moderate stool burden, patient was placed on Colace and MiraLAX as needed -Now complaining of diarrhea, added Imodium as needed  Obesity Estimated body mass index is 37.57 kg/m as calculated from the following:   Height as of this encounter: 5\' 1"  (1.549 m).   Weight as of this  encounter: 90.2 kg.   Deconditioning - will need home health PT, DME 3n1, CM consult placed   Code Status: Partial DVT Prophylaxis:   SCD's Family Communication: Discussed all imaging results, lab results, explained to the patient and fianc on the phone.  Plan for DC home tomorrow   Disposition Plan:     Status is: Inpatient  Remains inpatient appropriate because:Inpatient level of care appropriate due to severity of illness   Dispo: The patient is from: Home              Anticipated d/c is to: Home              Anticipated d/c date is: 1 day              Patient currently is not medically stable to d/c.      Time Spent in minutes   25 minutes  Procedures:   EGD 7/26>>> small hiatal hernia, chronic gastritis  EEG 7/26>>> severe diffuse encephalopathy, nonspecific etiology but could be related to sedation.No seizures or epileptiform discharges were seen throughout the recording.  Intubation  Consultants:   Gastroenterology Patient was admitted by critical care service  Antimicrobials:   Anti-infectives (From admission, onward)   Start     Dose/Rate Route Frequency Ordered Stop   03/31/20 1200  cefTRIAXone (ROCEPHIN) 2 g in sodium chloride 0.9 % 100 mL IVPB     Discontinue     2 g 200 mL/hr over 30 Minutes Intravenous Every 24 hours 03/31/20 1043     03/30/20 1200  cefTRIAXone (ROCEPHIN) 1 g in sodium chloride 0.9 % 100 mL IVPB  Status:  Discontinued        1 g 200 mL/hr over 30 Minutes Intravenous Every 24 hours 03/30/20 1004 03/31/20 1043   03/28/20 1400  vancomycin (VANCOCIN) IVPB 1000 mg/200 mL premix  Status:  Discontinued        1,000 mg 200 mL/hr over 60 Minutes Intravenous Every 12 hours 03/28/20 1023 03/30/20 1558   03/28/20 1200  ceFEPIme (MAXIPIME) 2 g in sodium chloride 0.9 % 100 mL IVPB  Status:  Discontinued        2 g 200 mL/hr over 30 Minutes Intravenous Every 8 hours 03/28/20 0900 03/30/20 1004   03/28/20 1200  metroNIDAZOLE (FLAGYL) IVPB 500 mg   Status:  Discontinued        500 mg 100 mL/hr over 60 Minutes Intravenous Every 8 hours 03/28/20 0905 03/30/20 1004   03/28/20 0315  ceFEPIme (MAXIPIME) 2 g in sodium chloride 0.9 % 100 mL IVPB        2 g 200 mL/hr over 30 Minutes Intravenous  Once 03/28/20 0301 03/28/20 0420   03/28/20 0315  metroNIDAZOLE (FLAGYL) IVPB 500 mg        500 mg 100 mL/hr over 60 Minutes Intravenous  Once 03/28/20 0301 03/28/20 0420   03/28/20 0315  vancomycin (VANCOCIN) IVPB 1000 mg/200 mL premix  Status:  Discontinued  1,000 mg 200 mL/hr over 60 Minutes Intravenous  Once 03/28/20 0301 03/28/20 0312   03/28/20 0315  vancomycin (VANCOREADY) IVPB 1500 mg/300 mL        1,500 mg 150 mL/hr over 120 Minutes Intravenous  Once 03/28/20 0312 03/28/20 0756         Medications  Scheduled Meds: . amitriptyline  100 mg Oral QHS  . aspirin  81 mg Oral Daily  . atorvastatin  40 mg Oral Daily  . benzonatate  100 mg Oral TID  . Chlorhexidine Gluconate Cloth  6 each Topical Daily  . DULoxetine  60 mg Oral Daily  . insulin aspart  0-15 Units Subcutaneous TID WC  . insulin aspart  0-5 Units Subcutaneous QHS  . levETIRAcetam  750 mg Oral BID  . mouth rinse  15 mL Mouth Rinse BID  . metoprolol succinate  25 mg Oral Daily  . oxybutynin  10 mg Oral BID  . pantoprazole  40 mg Oral BID AC  . pregabalin  75 mg Oral BID  . sodium chloride flush  10-40 mL Intracatheter Q12H   Continuous Infusions: . cefTRIAXone (ROCEPHIN)  IV Stopped (03/31/20 1343)  . lactated ringers 10 mL/hr at 03/30/20 0921   PRN Meds:.acetaminophen, docusate sodium, guaiFENesin-dextromethorphan, labetalol, loperamide, oxyCODONE, phenol, polyethylene glycol, sodium chloride flush, tiZANidine      Subjective:   Jordan Rivas was seen and examined today.  Alert and oriented, feeling better today, coughing is better.  No fevers or chills.  Overall headache and arthritis pain is improving.  Patient denies dizziness, chest pain,  N/V new  weakness, numbess, tingling. No acute events overnight.    Objective:   Vitals:   04/01/20 0600 04/01/20 0800 04/01/20 0900 04/01/20 1119  BP: (!) 133/76 (!) 160/68 (!) 153/82 (!) 134/88  Pulse: 65 72 71 75  Resp: 22 21 (!) 24 18  Temp:   98.3 F (36.8 C) 98.5 F (36.9 C)  TempSrc:   Oral Oral  SpO2: 94% 93% 91% 94%  Weight:      Height:        Intake/Output Summary (Last 24 hours) at 04/01/2020 1253 Last data filed at 04/01/2020 1120 Gross per 24 hour  Intake 180 ml  Output 1800 ml  Net -1620 ml     Wt Readings from Last 3 Encounters:  03/30/20 90.2 kg  10/14/19 86.4 kg  06/23/19 83.5 kg   Physical Exam  General: Alert and oriented x 3, NAD  Cardiovascular: S1 S2 clear, RRR. No pedal edema b/l  Respiratory:  decreased breath sound at the bases, improving  Gastrointestinal: Soft, nontender, nondistended, NBS  Ext: no pedal edema bilaterally  Neuro: chronic left-sided hemiparesis  Musculoskeletal: No cyanosis, clubbing  Skin: No rashes  Psych: Normal affect and demeanor, alert and oriented x3    Data Reviewed:  I have personally reviewed following labs and imaging studies  Micro Results Recent Results (from the past 240 hour(s))  Culture, blood (Routine x 2)     Status: None (Preliminary result)   Collection Time: 03/28/20  1:09 AM   Specimen: BLOOD RIGHT FOREARM  Result Value Ref Range Status   Specimen Description   Final    BLOOD RIGHT FOREARM Performed at [facility] [address], [place], [place] [address]    Special Requests   Final    BOTTLES DRAWN AEROBIC AND ANAEROBIC Blood Culture results may not be optimal due to an inadequate volume of blood received in culture bottles Performed  at [facility], [place] [address], [place], [place] [address]    Culture   Final    NO GROWTH 3 DAYS Performed at [facility], [place] [address] [place], [place] [address]    Report Status PENDING  Incomplete  Urine  culture     Status: Abnormal   Collection Time: 03/28/20  2:27 AM   Specimen: In/Out Cath Urine  Result Value Ref Range Status   Specimen Description   Final    IN/OUT CATH URINE Performed at [facility] [address] [place], [place] [address]    Special Requests   Final    NONE Performed at [facility], [place] [address] [place], [place] [address]    Culture 50,000 COLONIES/mL ESCHERICHIA COLI (A)  Final   Report Status 03/30/2020 FINAL  Final   Organism ID, Bacteria ESCHERICHIA COLI (A)  Final      Susceptibility   Escherichia coli - MIC*    AMPICILLIN <=2 SENSITIVE Sensitive     CEFAZOLIN <=4 SENSITIVE Sensitive     CEFTRIAXONE <=0.25 SENSITIVE Sensitive     CIPROFLOXACIN <=0.25 SENSITIVE Sensitive     GENTAMICIN <=1 SENSITIVE Sensitive     IMIPENEM <=0.25 SENSITIVE Sensitive     NITROFURANTOIN <=16 SENSITIVE Sensitive     TRIMETH/SULFA <=20 SENSITIVE Sensitive     AMPICILLIN/SULBACTAM <=2 SENSITIVE Sensitive     PIP/TAZO <=4 SENSITIVE Sensitive     * 50,000 COLONIES/mL ESCHERICHIA COLI  Culture, blood (Routine x 2)     Status: None (Preliminary result)   Collection Time: 03/28/20  2:45 AM   Specimen: BLOOD  Result Value Ref Range Status   Specimen Description   Final    BLOOD LEFT ARM Performed at [facility] [address] [place], [place] [address]    Special Requests   Final    BOTTLES DRAWN AEROBIC AND ANAEROBIC Blood Culture adequate volume Performed at [facility] [address], [place],  [address]    Culture   Final    NO GROWTH 3 DAYS Performed at [facility], [place] [address] [place],  [address]    Report Status PENDING  Incomplete  SARS Coronavirus 2 by RT PCR (hospital order, performed in [facility] hospital lab) Nasopharyngeal Nasopharyngeal Swab     Status: None   Collection Time: 03/28/20  3:02 AM   Specimen: Nasopharyngeal Swab  Result Value Ref  Range Status   SARS Coronavirus 2 NEGATIVE NEGATIVE Final    Comment: (NOTE) SARS-CoV-2 target nucleic acids are NOT DETECTED.  The SARS-CoV-2 RNA is generally detectable in upper and lower respiratory specimens during the acute phase of infection. The lowest concentration of SARS-CoV-2 viral copies this assay can detect is 250 copies / mL. A negative result does not preclude SARS-CoV-2 infection and should not be used as the sole basis for treatment or other patient management decisions.  A negative result may occur with improper specimen collection / handling, submission of specimen other than nasopharyngeal swab, presence of viral mutation(s) within the areas targeted by this assay, and inadequate number of viral copies (<250 copies / mL). A negative result must be combined with clinical observations, patient history, and epidemiological information.  Fact Sheet for Patients:   StrictlyIdeas.no  Fact Sheet for Healthcare Providers: BankingDealers.co.za  This test is not yet approved or  cleared by the [place] FDA and has been authorized for detection and/or diagnosis of SARS-CoV-2  by FDA under an Emergency Use Authorization (EUA).  This EUA will remain in effect (meaning this test can be used) for the duration of the COVID-19 declaration under Section 564(b)(1) of the Act, 21 U.S.C. section 360bbb-3(b)(1), unless the authorization is terminated or revoked sooner.  Performed at [facility], [place] [address] [place], [place] [address]   MRSA PCR Screening     Status: None   Collection Time: 03/28/20  9:55 AM   Specimen: Nasopharyngeal  Result Value Ref Range Status   MRSA by PCR NEGATIVE NEGATIVE Final    Comment:        The GeneXpert MRSA Assay (FDA approved for NASAL specimens only), is one component of a comprehensive MRSA colonization surveillance program. It is not intended to diagnose  MRSA infection nor to guide or monitor treatment for MRSA infections. Performed at [facility], [place] [address] [place], [place] [address]   Culture, respiratory (tracheal aspirate)     Status: None (Preliminary result)   Collection Time: 03/28/20 12:18 PM   Specimen: Tracheal Aspirate; Respiratory  Result Value Ref Range Status   Specimen Description   Final    TRACHEAL ASPIRATE Performed at [facility] [address], [place], [place] [address]    Special Requests   Final    NONE Performed at [facility], [place] [address], [place], [place] [address]    Gram Stain   Final    ABUNDANT WBC PRESENT, PREDOMINANTLY PMN MODERATE GRAM POSITIVE COCCI    Culture   Final    MODERATE STREPTOCOCCUS PNEUMONIAE SUSCEPTIBILITIES TO FOLLOW Performed at [facility], [place] [address] [place], [place] [address]    Report Status PENDING  Incomplete    Radiology Reports DG Chest 2 View  Result Date: 03/28/2020 CLINICAL DATA:  Suspected sepsis EXAM: CHEST - 2 VIEW COMPARISON:  02/26/2013 FINDINGS: Cardiac shadow is stable. Mild vascular congestion is noted. The overall inspiratory effort is poor. No focal infiltrate is seen. No bony abnormality is noted. IMPRESSION: Mild vascular congestion. Electronically Signed   By: Inez Catalina M.D.   On: 03/28/2020 01:30   DG Abdomen 1 View  Result Date: 03/28/2020 CLINICAL DATA:  Intubation and nasogastric tube placement EXAM: ABDOMEN - 1 VIEW COMPARISON:  None similar FINDINGS: The nasogastric tube tip overlaps the stomach. Diffuse desiccated stool. Nonobstructive bowel gas pattern. No concerning mass effect or gas collection. IMPRESSION: 1. The enteric tube tip is at the stomach. 2. Desiccated stool retention. Electronically Signed   By: Monte Fantasia M.D.   On: 03/28/2020 04:32   CT Head Wo Contrast  Result Date: 03/28/2020 CLINICAL DATA:  Mental status change.  Unknown cause. EXAM: CT  HEAD WITHOUT CONTRAST TECHNIQUE: Contiguous axial images were obtained from the base of the skull through the vertex without intravenous contrast. COMPARISON:  CT dated February 21, 2013. FINDINGS: Brain: No hemorrhage. No extraaxial collection.No midline shift. There is ex vacuo dilatation of the right lateral ventricle. The basal cisterns are unremarkable. Enlarged old MCA territory infarct is noted on the patient's right. There is Wallerian degeneration. The cerebellum is unremarkable. The sella is unremarkable. Again noted is an extra-axial para falcine mass posteriorly measuring approximately 1.2 cm, stable from 2014. There is a stable amount of surrounding vasogenic edema. Vascular: No hyperdense vessel or unexpected calcification. Skull: The patient is status post prior right frontoparietal craniotomy. The skull base is unremarkable. The visualized upper cervical spine is unremarkable. Sinuses/Orbits: The visualized  orbits are unremarkable. The paranasal sinuses are unremarkable. The mastoid air cells are clear. Other: The visualized parotid gland is unremarkable. There is no scalp soft tissue swelling. IMPRESSION: 1. No acute intracranial abnormality. 2. Old right MCA territory infarct. 3. Stable extra-axial para falcine mass, consistent with a meningioma. Electronically Signed   By: Constance Holster M.D.   On: 03/28/2020 01:49   CT ANGIO CHEST PE W OR WO CONTRAST  Result Date: 03/28/2020 CLINICAL DATA:  PE suspected altered mental status, fevers, hypoxia, intubated, coffee-ground emesis noted EXAM: CT ANGIOGRAPHY CHEST WITH CONTRAST TECHNIQUE: Multidetector CT imaging of the chest was performed using the standard protocol during bolus administration of intravenous contrast. Multiplanar CT image reconstructions and MIPs were obtained to evaluate the vascular anatomy. CONTRAST:  132mL OMNIPAQUE IOHEXOL 350 MG/ML SOLN COMPARISON:  None. FINDINGS: Cardiovascular: Examination for pulmonary embolism is somewhat  limited by motion artifact. Within this limitation, no evidence of pulmonary embolism through the proximal segmental pulmonary arterial level. Normal heart size. No pericardial effusion. Mediastinum/Nodes: No enlarged mediastinal, hilar, or axillary lymph nodes. Endotracheal intubation. Thyroid gland is normal. Frothy debris in the upper esophagus. Lungs/Pleura: Very dense consolidations and or atelectasis of the dependent bilateral lower lobes, with additional consolidation of the right middle lobe and lingula, and scattered heterogeneous and ground-glass airspace opacities elsewhere. Small bilateral pleural effusions. Upper Abdomen: Please see separately reported CT of the abdomen and pelvis. Musculoskeletal: No chest wall abnormality. No acute or significant osseous findings. Review of the MIP images confirms the above findings. IMPRESSION: 1. Examination for pulmonary embolism is somewhat limited by motion artifact. Within this limitation, no evidence of pulmonary embolism through the proximal segmental pulmonary arterial level. 2. Very dense consolidations and or atelectasis of the dependent bilateral lower lobes, with additional consolidation of the right middle lobe and lingula, and scattered heterogeneous and ground-glass airspace opacities elsewhere. Findings are consistent with multifocal infection and/or aspiration. 3. Small associated bilateral pleural effusions. 4. Endotracheal intubation. Electronically Signed   By: Eddie Candle M.D.   On: 03/28/2020 09:52   CT ABDOMEN PELVIS W CONTRAST  Result Date: 03/28/2020 CLINICAL DATA:  Altered mental status, fevers, hypoxia, coffee-ground emesis, hemoglobin of 6 EXAM: CT ABDOMEN AND PELVIS WITH CONTRAST TECHNIQUE: Multidetector CT imaging of the abdomen and pelvis was performed using the standard protocol following bolus administration of intravenous contrast. CONTRAST:  121mL OMNIPAQUE IOHEXOL 350 MG/ML SOLN COMPARISON:  CT abdomen pelvis, 11/03/2010  FINDINGS: Lower chest: No acute abnormality. Hepatobiliary: No solid liver abnormality is seen. No gallstones, gallbladder wall thickening, or biliary dilatation. Pancreas: Unremarkable. No pancreatic ductal dilatation or surrounding inflammatory changes. Spleen: Normal in size without significant abnormality. Adrenals/Urinary Tract: Adrenal glands are unremarkable. Kidneys are normal, without renal calculi, solid lesion, or hydronephrosis. Foley catheter in the urinary bladder. Stomach/Bowel: Esophagogastric tube with tip and side port below the diaphragm. Appearance of the pylorus suggests mucosal edema and ulceration (series 2, image 30, series 4, image 29). Appendix appears normal. No evidence of bowel wall thickening, distention, or inflammatory changes. Moderate burden of stool and stool balls throughout the colon. Vascular/Lymphatic: No significant vascular findings are present. No enlarged abdominal or pelvic lymph nodes. Reproductive: Uterine fibroids. Other: No abdominal wall hernia or abnormality. No abdominopelvic ascites. Musculoskeletal: No acute or significant osseous findings. IMPRESSION: 1. Appearance of the pylorus suggests mucosal edema and ulceration. Consider direct endoscopic visualization to evaluate for bleeding peptic ulcer in the setting of coffee-ground emesis and anemia. 2. Esophagogastric tube with tip and side  port below the diaphragm. 3. Moderate burden of stool and stool balls throughout the colon. 4. Uterine fibroids. Electronically Signed   By: Eddie Candle M.D.   On: 03/28/2020 10:06   DG CHEST PORT 1 VIEW  Result Date: 03/31/2020 CLINICAL DATA:  Pneumonia. EXAM: PORTABLE CHEST 1 VIEW COMPARISON:  03/29/2020. FINDINGS: Interim extubation and removal NG tube. Right PICC line stable position. Heart size normal. Low lung volumes. Bibasilar atelectasis and infiltrates again noted. No interim change. No pleural effusion or pneumothorax. IMPRESSION: 1. Interim extubation removal of  NG tube. Right PICC line in stable position. 2. Low lung volumes. Bibasilar atelectasis and infiltrates again noted. No interim change. Electronically Signed   By: Marcello Moores  Register   On: 03/31/2020 06:41   DG Chest Port 1 View  Result Date: 03/29/2020 CLINICAL DATA:  Acute respiratory failure. EXAM: PORTABLE CHEST 1 VIEW COMPARISON:  CT chest and chest x-ray 03/28/2020. FINDINGS: Interim placement of PICC line, its tip is at the cavoatrial junction. Endotracheal tube and NG tube in stable position. Heart size stable. Low lung volumes with bibasilar atelectasis/infiltrates. No prominent pleural effusion or pneumothorax. IMPRESSION: 1. Interim placement of PICC line, its tip is at the cavoatrial junction. Endotracheal tube and NG tube in stable position. 2.  Low lung volumes with bibasilar atelectasis/infiltrates. Electronically Signed   By: Marcello Moores  Register   On: 03/29/2020 06:17   DG Chest Portable 1 View  Result Date: 03/28/2020 CLINICAL DATA:  Intubation EXAM: PORTABLE CHEST 1 VIEW COMPARISON:  03/28/2020 FINDINGS: Endotracheal tube with tip at the clavicular heads. The enteric tube reaches the stomach. Low volume chest with streaky density on both sides. No edema, effusion, or pneumothorax. Borderline heart size. IMPRESSION: 1. Unremarkable hardware positioning. 2. Low volume chest with perihilar atelectasis or infiltrates. Electronically Signed   By: Monte Fantasia M.D.   On: 03/28/2020 04:34   EEG adult  Result Date: 03/28/2020 Lora Havens, MD     03/28/2020 11:18 AM Patient Name: ABIE KILLIAN MRN: [id number] Epilepsy Attending: Lora Havens Referring Physician/Provider: Dr. Marshell Garfinkel Date: 03/28/2020 Duration: 23.34 minutes Patient history: 59 year old female with right hemispheric for stroke due to brain aneurysm, seizure disorder who presented with altered mental status.  EEG evaluate for seizures. Level of alertness: Comatose AEDs during EEG study: Keppra, propofol Technical  aspects: This EEG study was done with scalp electrodes positioned according to the 10-20 International system of electrode placement. Electrical activity was acquired at a sampling rate of 500Hz  and reviewed with a high frequency filter of 70Hz  and a low frequency filter of 1Hz . EEG data were recorded continuously and digitally stored. Description: EEG showed continuous generalized 3 to 5 Hz theta-delta slowing.  Hyperventilation and photic stimulation were not performed.   ABNORMALITY -Continuous slow, generalized IMPRESSION: This study is suggestive of severe diffuse encephalopathy, nonspecific etiology but could be related to sedation.  No seizures or epileptiform discharges were seen throughout the recording. Priyanka O Yadav   [place] EKG SITE RITE  Result Date: 03/28/2020 If [facility] image not attached, placement could not be confirmed due to current cardiac rhythm.   Lab Data:  CBC: Recent Labs  Lab 03/28/20 0220 03/28/20 1010 03/28/20 1620 03/29/20 0325 03/30/20 0414 03/31/20 0449 04/01/20 0454  WBC 9.6   < > 19.8* 15.5* 17.5* 16.2* 13.4*  NEUTROABS 8.2*  --   --   --   --   --   --   HGB 6.0*   < > 9.7* 8.7*  9.5* 10.1* 10.9*  HCT 21.4*   < > 33.0* 28.9* 30.2* 32.4* 34.9*  MCV 87.0   < > 82.1 81.0 78.0* 76.8* 78.8*  PLT 144*   < > 64* 228 262 301 310   < > = values in this interval not displayed.   Basic Metabolic Panel: Recent Labs  Lab 03/28/20 0220 03/28/20 0220 03/28/20 0837 03/29/20 0325 03/30/20 0414 03/31/20 0449 04/01/20 0454  NA 135  --   --  140 141 142 140  K 4.0  --   --  3.6 2.9* 3.1* 3.3*  CL 105  --   --  105 103 105 105  CO2 16*  --   --  25 27 26 26   GLUCOSE 77  --   --  132* 135* 106* 100*  BUN 9  --   --  6* 6* 6* 8  CREATININE 0.38*   < > 0.68 0.55 0.61 0.52 0.55  CALCIUM 7.0*  --   --  8.3* 8.4* 8.9 8.9  MG  --   --   --  1.6* 2.1  --   --   PHOS  --   --   --  2.1* 2.6  --   --    < > = values in this interval not displayed.   GFR: Estimated  Creatinine Clearance: 75.5 mL/min (by C-G formula based on SCr of 0.55 mg/dL). Liver Function Tests: Recent Labs  Lab 03/28/20 0220  AST 13*  ALT 17  ALKPHOS 49  BILITOT 0.6  PROT 3.1*  ALBUMIN 1.5*   No results for input(s): LIPASE, AMYLASE in the last 168 hours. No results for input(s): AMMONIA in the last 168 hours. Coagulation Profile: Recent Labs  Lab 03/28/20 0220  INR 1.6*   Cardiac Enzymes: No results for input(s): CKTOTAL, CKMB, CKMBINDEX, TROPONINI in the last 168 hours. BNP (last 3 results) No results for input(s): PROBNP in the last 8760 hours. HbA1C: No results for input(s): HGBA1C in the last 72 hours. CBG: Recent Labs  Lab 03/31/20 1157 03/31/20 1524 03/31/20 2141 04/01/20 0908 04/01/20 1134  GLUCAP 120* 139* 145* 90 122*   Lipid Profile: No results for input(s): CHOL, HDL, LDLCALC, TRIG, CHOLHDL, LDLDIRECT in the last 72 hours. Thyroid Function Tests: No results for input(s): TSH, T4TOTAL, FREET4, T3FREE, THYROIDAB in the last 72 hours. Anemia Panel: No results for input(s): VITAMINB12, FOLATE, FERRITIN, TIBC, IRON, RETICCTPCT in the last 72 hours. Urine analysis:    Component Value Date/Time   COLORURINE YELLOW 03/28/2020 0227   APPEARANCEUR CLEAR 03/28/2020 0227   LABSPEC 1.017 03/28/2020 0227   PHURINE 5.0 03/28/2020 0227   GLUCOSEU NEGATIVE 03/28/2020 0227   HGBUR NEGATIVE 03/28/2020 0227   BILIRUBINUR NEGATIVE 03/28/2020 0227   KETONESUR NEGATIVE 03/28/2020 0227   PROTEINUR NEGATIVE 03/28/2020 0227   UROBILINOGEN 0.2 02/21/2013 0347   NITRITE NEGATIVE 03/28/2020 0227   LEUKOCYTESUR NEGATIVE 03/28/2020 0227     Tanelle Lanzo M.D. [facility] 04/01/2020, 12:53 PM   Call night coverage person covering after 7pm

## 2020-04-01 NOTE — Evaluation (Signed)
Occupational Therapy Evaluation Patient Details Name: Jordan Rivas MRN: [id number] DOB: 05/30/1961 Today's Date: 04/01/2020    History of Present Illness 59yo female admitted 03/28/20 with AMS, hypoxia, fever (102), coffee ground emesis, sepsis with concern for UTI, suspected GIB .PMH: chronic pain, CVA with residual left spastic hemiparesis (1995) dental caries, depression, DM, GERD, headache, hypercholesterolemia, HTN, insomnia, neuropathy, PVD, PNA, seizures. Pt intubated 7/26-27/21 - self extubated. CXR - suspect aspiration PNA.   Clinical Impression   [name] is a 59 year old woman with left sided hemiplegia who typically ambulates at home with a cane who is s/p sepsis and extubation. On evaluation she presents without active movement in left upper or lower extremity, complaints of pain in LLE, generalized weakness, decreased activity tolerance, and decreased balance resulting in impaired ability to perform baseline mobility and ADLs. Patient does have assistance for dressing and bathing at home but typically toilets and ambulates to the bathroom. Patient required max assist x 2 to pivot to stand and to pivot tor recliner. Patient needed total assistance for toileting and lower body dressing at this time. Patient will benefit from skilled OT services to improve deficits and learn compensatory strategies as needed in order tor return home at discharge. Currently patient refusing short term rehab at facility and wants to go home at discharge.    Follow Up Recommendations  Home health OT    Equipment Recommendations  3 in 1 bedside commode    Recommendations for Other Services       Precautions / Restrictions Precautions Precautions: Fall Precaution Comments: Left hemiplegia Required Braces or Orthoses: Other Brace Other Brace: L AFO Restrictions Weight Bearing Restrictions: No      Mobility Bed Mobility Overal bed mobility: Needs Assistance Bed Mobility:  Rolling;Sidelying to Sit Rolling: Mod assist Sidelying to sit: HOB elevated;Mod assist       General bed mobility comments: pt self assisted the  left leg hooking rightn foot under, still required mod assist to complete mobility to sitting on bed edge, use of bed pad  Transfers Overall transfer level: Needs assistance Equipment used: 2 person hand held assist Transfers: Sit to/from Stand;Stand Pivot Transfers Sit to Stand: Max assist;+2 physical assistance;+2 safety/equipment;From elevated surface Stand pivot transfers: Max assist;+2 physical assistance;+2 safety/equipment       General transfer comment: Left foot sliding forward with attempts to stand. left fott/knee blocked for stand and pivot to recliner with max assist.    Balance Overall balance assessment: Needs assistance Sitting-balance support: Single extremity supported Sitting balance-Leahy Scale: Fair       Standing balance-Leahy Scale: Poor Standing balance comment: unable to stand without assistance                           ADL either performed or assessed with clinical judgement   ADL Overall ADL's : Needs assistance/impaired Eating/Feeding: Set up   Grooming: Set up;Wash/dry face;Sitting   Upper Body Bathing: Moderate assistance;Set up;Sitting   Lower Body Bathing: Maximal assistance;Sit to/from stand;+2 for physical assistance Lower Body Bathing Details (indicate cue type and reason): +2 for sit to stand, patient able to bathe some body parts in seated position. Upper Body Dressing : Maximal assistance;Sitting   Lower Body Dressing: Total assistance;Sit to/from stand Lower Body Dressing Details (indicate cue type and reason): total to donn socks. Toilet Transfer: +2 for physical assistance;+2 for safety/equipment;BSC;Stand-pivot Toilet Transfer Details (indicate cue type and reason): replicated with transfer to recliner.  Toileting- Clothing Manipulation and Hygiene: Total assistance;+2 for  physical assistance;+2 for safety/equipment;Sit to/from stand       Functional mobility during ADLs: +2 for safety/equipment;+2 for physical assistance;Maximal assistance       Vision   Vision Assessment?: No apparent visual deficits     Perception     Praxis      Pertinent Vitals/Pain Pain Assessment: Faces Faces Pain Scale: Hurts whole lot Pain Location: left leg, right knee Pain Descriptors / Indicators: Aching;Sore Pain Intervention(s): Monitored during session     Hand Dominance Right   Extremity/Trunk Assessment Upper Extremity Assessment Upper Extremity Assessment: RUE deficits/detail;LUE deficits/detail RUE Deficits / Details: WFL ROM, generalized weakness RUE Sensation: WNL RUE Coordination: WNL LUE Deficits / Details: Dense hemiplegia in LUE without any activity movement. Reports getting botox shots to reduce tone and pain. LUE Sensation: WNL LUE Coordination: decreased gross motor;decreased fine motor   Lower Extremity Assessment Lower Extremity Assessment: Defer to PT evaluation RLE Deficits / Details: grossly WFL LLE Deficits / Details: increased tone. ankle equinovarus, LT intact   Cervical / Trunk Assessment Cervical / Trunk Assessment: Normal   Communication Communication Communication: No difficulties   Cognition Arousal/Alertness: Awake/alert Behavior During Therapy: WFL for tasks assessed/performed Overall Cognitive Status: Within Functional Limits for tasks assessed                                     General Comments       Exercises     Shoulder Instructions      Home Living Family/patient expects to be discharged to:: Private residence Living Arrangements: Children;Spouse/significant other (grandson, fiance) Available Help at Discharge: Family;Available 24 hours/day Type of Home: House Home Access: Stairs to enter [employer] of Steps: 5 Entrance Stairs-Rails: Left (neeeds assistance) Home Layout: One  level (1 step in house)     Bathroom Shower/Tub: Teacher, early years/pre: Standard     Home Equipment: Shower seat;Cane - single point;Wheelchair - power          Prior Functioning/Environment Level of Independence: Needs assistance  Gait / Transfers Assistance Needed: walks with cane, uses wc intermittently ADL's / Homemaking Assistance Needed: Has assistance for upper body dressing, reports she can get her pants on, toilet by herself   Comments: Has an aide 2 hrs a day for IADLs, bathing and she assists with dressing as well. Patinet cane do some of it but typically just accepts the help.        OT Problem List: Decreased strength;Decreased range of motion;Decreased activity tolerance;Decreased coordination;Impaired balance (sitting and/or standing);Impaired UE functional use;Pain      OT Treatment/Interventions: Self-care/ADL training;Therapeutic exercise;Neuromuscular education;DME and/or AE instruction;Therapeutic activities;Balance training    OT Goals(Current goals can be found in the care plan section) Acute Rehab OT Goals Patient Stated Goal: to go home OT Goal Formulation: With patient Time For Goal Achievement: 04/15/20 Potential to Achieve Goals: Good  OT Frequency: Min 2X/week   Barriers to D/C:            Co-evaluation PT/OT/SLP Co-Evaluation/Treatment: Yes Reason for Co-Treatment: For patient/therapist safety;To address functional/ADL transfers PT goals addressed during session: Mobility/safety with mobility;Strengthening/ROM OT goals addressed during session: ADL's and self-care;Strengthening/ROM      AM-PAC OT "6 Clicks" Daily Activity     Outcome Measure Help from another person eating meals?: A Little Help from another person taking care of personal grooming?: A  Little Help from another person toileting, which includes using toliet, bedpan, or urinal?: Total Help from another person bathing (including washing, rinsing, drying)?: A  Lot Help from another person to put on and taking off regular upper body clothing?: A Lot Help from another person to put on and taking off regular lower body clothing?: Total 6 Click Score: 12   End of Session Equipment Utilized During Treatment: Gait belt Nurse Communication: Mobility status  Activity Tolerance: Patient tolerated treatment well Patient left: in chair;with call bell/phone within reach;with chair alarm set  OT Visit Diagnosis: Unsteadiness on feet (R26.81);History of falling (Z91.81);Muscle weakness (generalized) (M62.81);Hemiplegia and hemiparesis;Pain Hemiplegia - Right/Left: Left Hemiplegia - dominant/non-dominant: Non-Dominant Hemiplegia - caused by: Cerebral infarction Pain - Right/Left: Left Pain - part of body: Knee;Leg                Time: 0981-1914 OT Time Calculation (min): 26 min Charges:  OT General Charges $OT Visit: 1 Visit OT Evaluation $OT Eval Moderate Complexity: 1 Mod  Jordan Rivas, OTR/L [place]  Office [phone] Pager: [place] 04/01/2020, 11:11 AM

## 2020-04-01 NOTE — Evaluation (Addendum)
Physical Therapy Evaluation Patient Details Name: Jordan Rivas MRN: [id number] DOB: [birth year] Today's Date: 04/01/2020   History of Present Illness  59yo female admitted 03/28/20 with AMS, hypoxia, fever (102), coffee ground emesis, sepsis with concern for UTI, suspected GIB .PMH: chronic pain, CVA with residual left spastic hemiparesis (1995) dental caries, depression, DM, GERD, headache, hypercholesterolemia, HTN, insomnia, neuropathy, PVD, PNA, seizures. Pt intubated 7/26-27/21 - self extubated. CXR - suspect aspiration PNA.  Clinical Impression  The patient is A/O x 4. Patient resides with grandson. PTA was modified independent with ambulation with  A cane. Patient currently required 2 max assist for transfer to recliner.  Patient reports using  Left AFO which is not here in room. Patient reports that she plans to return home. Patient on RA with SPO2 >92%. Pt admitted with above diagnosis.  Pt currently with functional limitations due to the deficits listed below (see PT Problem List). Pt will benefit from skilled PT to increase their independence and safety with mobility to allow discharge to the venue listed below.       Follow Up Recommendations Home health PT;Supervision/Assistance - 24 hour    Equipment Recommendations  None recommended by PT    Recommendations for Other Services       Precautions / Restrictions Precautions Precautions: Fall Precaution Comments: Left hemiplegia Required Braces or Orthoses: Other Brace Other Brace: L AFO      Mobility  Bed Mobility Overal bed mobility: Needs Assistance Bed Mobility: Rolling;Sidelying to Sit Rolling: Mod assist Sidelying to sit: HOB elevated;Mod assist       General bed mobility comments: pt self assisted the  left leg hooking rightn foot under, still required mod assist to complete mobility to sitting on bed edge, use of bed pad  Transfers Overall transfer level: Needs assistance Equipment used: 2 person hand held  assist Transfers: Sit to/from Stand;Stand Pivot Transfers Sit to Stand: Max assist;+2 physical assistance;+2 safety/equipment;From elevated surface Stand pivot transfers: Max assist;+2 physical assistance;+2 safety/equipment       General transfer comment: Left foot sliding forward with attempts to stand. left fott/knee blocked for stand and pivot to recliner with max assist.  Ambulation/Gait                Stairs            Wheelchair Mobility    Modified Rankin (Stroke Patients Only)       Balance Overall balance assessment: Needs assistance Sitting-balance support: Single extremity supported Sitting balance-Leahy Scale: Fair                                       Pertinent Vitals/Pain Pain Assessment: 0-10 Faces Pain Scale: Hurts whole lot Pain Location: left leg, right knee Pain Descriptors / Indicators: Aching;Sore Pain Intervention(s): Monitored during session;Limited activity within patient's tolerance    Home Living Family/patient expects to be discharged to:: Private residence Living Arrangements: Children;Spouse/significant other (grandson, fiance) Available Help at Discharge: Family;Available 24 hours/day Type of Home: House Home Access: Stairs to enter Entrance Stairs-Rails: Left (neeeds assistance) Entrance Stairs-Number of Steps: 5 Home Layout: One level (1 step in house) Home Equipment: Shower seat;Cane - single point;Wheelchair - power      Prior Function Level of Independence: Needs assistance   Gait / Transfers Assistance Needed: walks with cane, uses wc intermittently  ADL's / Homemaking Assistance Needed: Has assistance for upper body dressing, reports  she can get her pants on, toilet by herself  Comments: Has an aide 2 hrs a day for IADLs, bathing and she assists with dressing as well. Patinet cane do some of it but typically just accepts the help.     Hand Dominance   Dominant Hand: Right    Extremity/Trunk  Assessment        Lower Extremity Assessment Lower Extremity Assessment: RLE deficits/detail;LLE deficits/detail RLE Deficits / Details: grossly WFL LLE Deficits / Details: increased tone. ankle equinovarus, LT intact    Cervical / Trunk Assessment Cervical / Trunk Assessment: Normal  Communication   Communication: No difficulties  Cognition Arousal/Alertness: Awake/alert Behavior During Therapy: WFL for tasks assessed/performed Overall Cognitive Status: Within Functional Limits for tasks assessed                                        General Comments      Exercises     Assessment/Plan    PT Assessment Patient needs continued PT services  PT Problem List Decreased strength;Decreased mobility;Decreased safety awareness;Impaired tone;Decreased range of motion;Decreased knowledge of precautions;Decreased coordination;Decreased activity tolerance;Decreased balance;Decreased knowledge of use of DME       PT Treatment Interventions DME instruction;Therapeutic activities;Gait training;Therapeutic exercise;Patient/family education;Balance training;Functional mobility training    PT Goals (Current goals can be found in the Care Plan section)  Acute Rehab PT Goals Patient Stated Goal: to go home PT Goal Formulation: With patient Time For Goal Achievement: 04/15/20 Potential to Achieve Goals: Good    Frequency Min 3X/week   Barriers to discharge        Co-evaluation               AM-PAC PT "6 Clicks" Mobility  Outcome Measure Help needed turning from your back to your side while in a flat bed without using bedrails?: A Lot Help needed moving from lying on your back to sitting on the side of a flat bed without using bedrails?: A Lot Help needed moving to and from a bed to a chair (including a wheelchair)?: A Lot Help needed standing up from a chair using your arms (e.g., wheelchair or bedside chair)?: A Lot Help needed to walk in hospital room?:  Total Help needed climbing 3-5 steps with a railing? : Total 6 Click Score: 10    End of Session Equipment Utilized During Treatment: Gait belt Activity Tolerance: Patient tolerated treatment well Patient left: in chair;with call bell/phone within reach;with nursing/sitter in room;with chair alarm set Nurse Communication: Mobility status;Need for lift equipment (sara stedy) PT Visit Diagnosis: Unsteadiness on feet (R26.81);Difficulty in walking, not elsewhere classified (R26.2);Other symptoms and signs involving the nervous system (R29.898);Hemiplegia and hemiparesis Hemiplegia - Right/Left: Left Hemiplegia - dominant/non-dominant: Non-dominant Hemiplegia - caused by: Other Nontraumatic intracranial hemorrhage    Time: 0905-0931 PT Time Calculation (min) (ACUTE ONLY): 26 min   Charges:   PT Evaluation $PT Eval Moderate Complexity: [place] Pager [phone] Office [phone]   Claretha Cooper 04/01/2020, 10:56 AM

## 2020-04-01 NOTE — TOC Progression Note (Signed)
Transition of Care [facility]) - Progression Note    Patient Details  Name: Jordan Rivas MRN: [id number] Date of Birth: 1961/05/13  Transition of [facility]) CM/SW Contact  Leeroy Cha, RN Phone Number: 04/01/2020, 8:59 AM  Clinical Narrative:    ams on r.a. iv rocephin, wbc 13.4, p-following for rpogression/   Expected Discharge Plan: Home/Self Care Barriers to Discharge: Continued Medical Work up  Expected Discharge Plan and Services Expected Discharge Plan: Home/Self Care   Discharge Planning Services: CM Consult   Living arrangements for the past 2 months: Single Family Home                                       Social Determinants of Health (SDOH) Interventions    Readmission Risk Interventions No flowsheet data found.

## 2020-04-02 DIAGNOSIS — D649 Anemia, unspecified: Secondary | ICD-10-CM | POA: Diagnosis not present

## 2020-04-02 DIAGNOSIS — G9341 Metabolic encephalopathy: Secondary | ICD-10-CM | POA: Diagnosis not present

## 2020-04-02 DIAGNOSIS — J9600 Acute respiratory failure, unspecified whether with hypoxia or hypercapnia: Secondary | ICD-10-CM | POA: Diagnosis not present

## 2020-04-02 DIAGNOSIS — G43709 Chronic migraine without aura, not intractable, without status migrainosus: Secondary | ICD-10-CM | POA: Diagnosis not present

## 2020-04-02 LAB — BASIC METABOLIC PANEL
Anion gap: 7 (ref 5–15)
BUN: 11 mg/dL (ref 8–23)
CO2: 26 mmol/L (ref 22–32)
Calcium: 8.7 mg/dL — ABNORMAL LOW (ref 8.9–10.3)
Chloride: 107 mmol/L (ref 98–111)
Creatinine, Ser: 0.65 mg/dL (ref 0.44–1.00)
GFR calc Af Amer: >60 mL/min (ref >60)
GFR calc non Af Amer: >60 mL/min (ref >60)
Glucose, Bld: 102 mg/dL — ABNORMAL HIGH (ref 70–99)
Potassium: 3.2 mmol/L — ABNORMAL LOW (ref 3.5–5.1)
Sodium: 140 mmol/L (ref 135–145)

## 2020-04-02 LAB — CBC
HCT: 35.6 % — ABNORMAL LOW (ref 36.0–46.0)
Hemoglobin: 10.9 g/dL — ABNORMAL LOW (ref 12.0–15.0)
MCH: 24.4 pg — ABNORMAL LOW (ref 26.0–34.0)
MCHC: 30.6 g/dL (ref 30.0–36.0)
MCV: 79.6 fL — ABNORMAL LOW (ref 80.0–100.0)
Platelets: 325 10*3/uL (ref 150–400)
RBC: 4.47 MIL/uL (ref 3.87–5.11)
RDW: 17.7 % — ABNORMAL HIGH (ref 11.5–15.5)
WBC: 12.9 10*3/uL — ABNORMAL HIGH (ref 4.0–10.5)
nRBC: 0.2 % (ref 0.0–0.2)

## 2020-04-02 LAB — CULTURE, BLOOD (ROUTINE X 2)
Culture: NO GROWTH
Culture: NO GROWTH
Special Requests: ADEQUATE

## 2020-04-02 LAB — GLUCOSE, CAPILLARY
Glucose-Capillary: 94 mg/dL (ref 70–99)
Glucose-Capillary: 119 mg/dL — ABNORMAL HIGH (ref 70–99)

## 2020-04-02 MED ORDER — AMOXICILLIN-POT CLAVULANATE 875-125 MG PO TABS
1.0000 | ORAL_TABLET | Freq: Two times a day (BID) | ORAL | 0 refills | Status: AC
Start: 2020-04-02 — End: 2020-04-07

## 2020-04-02 MED ORDER — POTASSIUM CHLORIDE CRYS ER 20 MEQ PO TBCR
40.0000 meq | EXTENDED_RELEASE_TABLET | Freq: Once | ORAL | Status: AC
  Administered 2020-04-02: 40 meq via ORAL
  Filled 2020-04-02: qty 2

## 2020-04-02 MED ORDER — GUAIFENESIN-DM 100-10 MG/5ML PO SYRP: 5.0000 mL | ORAL_SOLUTION | ORAL | 0 refills | Status: DC | PRN

## 2020-04-02 MED ORDER — BENZONATATE 100 MG PO CAPS: 100.0000 mg | ORAL_CAPSULE | Freq: Three times a day (TID) | ORAL | 0 refills | Status: AC | PRN

## 2020-04-02 MED ORDER — PREGABALIN 75 MG PO CAPS: 75.0000 mg | ORAL_CAPSULE | Freq: Two times a day (BID) | ORAL | 1 refills | Status: AC

## 2020-04-02 NOTE — TOC Progression Note (Signed)
Transition of Care [facility]) - Progression Note    Patient Details  Name: Jordan Rivas MRN: [id number] Date of Birth: 05-10-1961  Transition of Care [facility]) CM/SW Contact  Joaquin Courts, RN Phone Number: 04/02/2020, 12:07 PM  Clinical Narrative:    [facility] to provide home health PT and OT. [facility] to provide [facility].    Expected Discharge Plan: [place] Barriers to Discharge: No Barriers Identified  Expected Discharge Plan and Services Expected Discharge Plan: [place]   Discharge Planning Services: CM Consult Post Acute Care Choice: [place] arrangements for the past 2 months: Single Family Home Expected Discharge Date: 04/02/20               DME Arranged: 3-N-1 DME Agency: [facility] Date DME Agency Contacted: 04/02/20 Time DME Agency Contacted: 1206 Representative spoke with at DME Agency: [place]: PT, OT [place] Agency: [place] ([place]) Date [place]: 04/02/20 Time [place]: 1206 Representative spoke with at [place]: [place] ([place]) Interventions    Readmission Risk Interventions No flowsheet data found.

## 2020-04-02 NOTE — Discharge Summary (Signed)
Physician Discharge Summary   Patient ID: MAYCI HANING MRN: [id number] DOB/AGE: [birth year] 59 y.o.  Admit date: 03/28/2020 Discharge date: 04/02/2020  Primary Care Physician:  Mayra Neer, MD   Recommendations for Outpatient Follow-up:  1. Follow up with PCP in 1-2 weeks 2. Continue Augmentin 1 tab p.o. twice daily for 5 days  Home Health: Home health PT OT, RN Equipment/Devices: DME 3n 1  Discharge Condition: stable  CODE STATUS: Partial Diet recommendation: Heart healthy diet   Discharge Diagnoses:   . Acute respiratory failure (HCC) hypoxia . GI bleed . Sepsis ([place]) with septic shock, lactic acidosis . Multifocal pneumonia . Acute metabolic encephalopathy . GERD (gastroesophageal reflux disease) . Hypertension . Spastic hemiplegia affecting left nondominant side ([place]) . Lactic acidosis . Chronic pain syndrome . Chronic migraine    Consults:  Patient was admitted by critical care, Gastroenterology    Allergies:   Allergies  Allergen Reactions  . Celebrex [Celecoxib] Swelling and Other (See Comments)    "lips; it was terrible"  . Baclofen Swelling  . Aspirin   . Zolpidem      DISCHARGE MEDICATIONS: Allergies as of 04/02/2020      Reactions   Celebrex [celecoxib] Swelling, Other (See Comments)   "lips; it was terrible"   Baclofen Swelling   Aspirin    Zolpidem       Medication List    STOP taking these medications   meloxicam 15 MG tablet Commonly known as: MOBIC   ondansetron 4 MG tablet Commonly known as: ZOFRAN     TAKE these medications   acetaminophen 325 MG tablet Commonly known as: TYLENOL Take 650 mg by mouth every 6 (six) hours as needed for mild pain or headache.   Aimovig 70 MG/ML Soaj Generic drug: Erenumab-aooe Inject 70 mg into the skin every 30 (thirty) days.   Alcohol Prep 70 % Pads   amitriptyline 100 MG tablet Commonly known as: ELAVIL Take 100 mg by mouth at bedtime.   amoxicillin-clavulanate 875-125 MG  tablet Commonly known as: Augmentin Take 1 tablet by mouth 2 (two) times daily for 5 days.   AquaLance Lancets 30G Misc   aspirin 81 MG chewable tablet Chew 1 tablet (81 mg total) by mouth daily.   atorvastatin 80 MG tablet Commonly known as: LIPITOR Take 80 mg by mouth daily.   benzonatate 100 MG capsule Commonly known as: TESSALON Take 1 capsule (100 mg total) by mouth 3 (three) times daily as needed for cough.   Botox 100 units Solr injection Generic drug: botulinum toxin Type A Inject 500 Units into the muscle every 3 (three) months.   DULoxetine 60 MG capsule Commonly known as: CYMBALTA Take 60 mg by mouth daily.   esomeprazole 40 MG capsule Commonly known as: NEXIUM Take 40 mg by mouth 2 (two) times daily.   glimepiride 2 MG tablet Commonly known as: AMARYL Take 2 mg by mouth every morning.   guaiFENesin-dextromethorphan 100-10 MG/5ML syrup Commonly known as: ROBITUSSIN DM Take 5 mLs by mouth every 4 (four) hours as needed for cough.   ketoconazole 2 % cream Commonly known as: NIZORAL Apply 1 application topically 2 (two) times daily as needed for irritation.   levETIRAcetam 750 MG tablet Commonly known as: KEPPRA Take 1 tablet (750 mg total) by mouth 2 (two) times daily.   lidocaine 5 % ointment Commonly known as: XYLOCAINE Apply 1 application topically 4 (four) times daily as needed for moderate pain.   metoprolol succinate 25 MG 24 hr  tablet Commonly known as: TOPROL-XL Take 1 tablet (25 mg total) by mouth daily.   OneTouch Verio Soln   OneTouch Verio test strip Generic drug: glucose blood   oxybutynin 10 MG 24 hr tablet Commonly known as: DITROPAN-XL Take 10 mg by mouth 2 (two) times daily.   oxyCODONE 15 MG immediate release tablet Commonly known as: ROXICODONE Take 15 mg by mouth 4 (four) times daily as needed for pain.   pregabalin 75 MG capsule Commonly known as: LYRICA Take 1 capsule (75 mg total) by mouth 2 (two) times daily. TAKE 1  CAPSULE BY MOUTH THREE TIMES DAILY What changed:   how much to take  how to take this  when to take this   tiZANidine 2 MG tablet Commonly known as: ZANAFLEX TAKE 2 TABLETS BY MOUTH FOUR TIMES DAILY AS NEEDED FOR MUSCLE SPASMS What changed: See the new instructions.   Vitamin D 1000 units capsule Take 1,000 Units by mouth daily.            Durable Medical Equipment  (From admission, onward)         Start     Ordered   04/01/20 1252  For home use only DME 3 n 1  Once        04/01/20 1251           Brief H and P: For complete details please refer to admission H and P, but in brief Patient is a 59 year old female with history of chronic pain syndrome, CVA, diabetes mellitus, GERD, hypertension, hyperlipidemia, chronic migraine, seizures presented to ED with altered mental status, hypoxic with O2 sat in 50s at home, fever of 102, lactate> 11.  Patient was intubated in ED, also noted to have coffee-ground emesis in the posterior pharynx, hemoglobin of 6. Patient was admitted to ICU by critical care. Patient transferred to hospitalist service, assumed care on 7/29  Hospital Course:   Severe sepsis [facility]) with lactic acidosis with septic shock, multifocal pneumonia -BP was borderline low 114/60, tachycardia 99, febrile, lactic acidosis, altered mental status, hypoxia at the time of admission -CT chest, showed, no PE, very dense consolidations and/or atelectasis of the dependent bilateral lower lobes with additional consolidation of the right middle lobe and angular consistent with multifocal infection and/or aspiration -CT abdomen showed mucosal edema and ulceration at the pylorus, moderate stool burden -Patient was intubated and admitted to ICU, extubated on 7/27. -SLP evaluation completed, continue dysphagia 3 diet with thin liquids -Continue IV Rocephin, transition to oral Augmentin for 5 more days to complete full course -Sepsis physiology resolved patient currently at  baseline.     Acute respiratory failure with hypoxia (HCC) -Patient was intubated, on admission, self extubated on 7/27 -Currently stable, on room air -Continue Tessalon Perles, Robitussin  Acute blood loss anemia with upper GI bleed, history of GERD -Hemoglobin 6.0 on arrival, was noted to have coffee-ground emesis in the posterior pharynx -EGD showed small hiatal hernia, chronic gastritis, otherwise normal, outpatient follow-up as needed -Continue Protonix daily -H&H stable, 10.9  Hypokalemia Replaced    Acute metabolic encephalopathy -  likely due to #1, EEG on 7/26 showed severe diffuse encephalopathy, nonspecific, no seizures or epileptiform discharges -Continue Keppra Currently alert and oriented x3  Uncontrolled hypertension -BP initially borderline low at the time of admission due to sepsis, now stable -Continue Toprol-XL  History of CVA spastic hemiplegia affecting left nondominant side (HCC) -Currently no acute issues, continue aspirin, Lipitor    Seizure disorder (HCC) -Currently  stable, continue Keppra    Chronic migraine,  Chronic pain syndrome -Given altered mental status at the time of presentation, cautious use of narcotics, psych meds and sedatives  -Strongly recommended patient to decrease her oxycodone at home. -Decreased Lyrica to 75 mg twice daily, continue tizanidine as needed -Continue Cymbalta, Elavil  Diabetes mellitus type 2, NIDDM -Hemoglobin A1c 7.1, patient was placed on sliding scale insulin while inpatient  Constipation -At the time of admission CT abdomen showed moderate stool burden, patient was placed on Colace and MiraLAX as needed -Resolved  Obesity Estimated body mass index is 37.57 kg/m as calculated from the following:   Height as of this encounter: 5\' 1"  (1.549 m).   Weight as of this encounter: 90.2 kg.   Day of Discharge S: No acute complaints, hoping to go home today.  BP (!) 142/59 (BP Location: Right Leg)    Pulse 74   Temp 98.9 F (37.2 C) (Oral)   Resp 16   Ht 5\' 1"  (1.549 m)   Wt 81.1 kg   LMP 09/30/2011   SpO2 92%   BMI 33.80 kg/m   Physical Exam: General: Alert and awake oriented x3 not in any acute distress. HEENT: anicteric sclera, pupils reactive to light and accommodation CVS: S1-S2 clear no murmur rubs or gallops Chest: clear to auscultation bilaterally, no wheezing rales or rhonchi Abdomen: soft nontender, nondistended, normal bowel sounds Extremities: no cyanosis, clubbing or edema noted bilaterally Neuro: baseline left hemiparesis due to prior stroke    Get Medicines reviewed and adjusted: Please take all your medications with you for your next visit with your Primary MD  Please request your Primary MD to go over all hospital tests and procedure/radiological results at the follow up. Please ask your Primary MD to get all Hospital records sent to his/her office.  If you experience worsening of your admission symptoms, develop shortness of breath, life threatening emergency, suicidal or homicidal thoughts you must seek medical attention immediately by calling 911 or calling your MD immediately  if symptoms less severe.  You must read complete instructions/literature along with all the possible adverse reactions/side effects for all the Medicines you take and that have been prescribed to you. Take any new Medicines after you have completely understood and accept all the possible adverse reactions/side effects.   Do not drive when taking pain medications.   Do not take more than prescribed Pain, Sleep and Anxiety Medications  Special Instructions: If you have smoked or chewed Tobacco  in the last 2 yrs please stop smoking, stop any regular Alcohol  and or any Recreational drug use.  Wear Seat belts while driving.  Please note  You were cared for by a hospitalist during your hospital stay. Once you are discharged, your primary care physician will handle any further  medical issues. Please note that NO REFILLS for any discharge medications will be authorized once you are discharged, as it is imperative that you return to your primary care physician (or establish a relationship with a primary care physician if you do not have one) for your aftercare needs so that they can reassess your need for medications and monitor your lab values.   The results of significant diagnostics from this hospitalization (including imaging, microbiology, ancillary and laboratory) are listed below for reference.      Procedures/Studies:  DG Chest 2 View  Result Date: 03/28/2020 CLINICAL DATA:  Suspected sepsis EXAM: CHEST - 2 VIEW COMPARISON:  02/26/2013 FINDINGS: Cardiac shadow is stable. Mild  vascular congestion is noted. The overall inspiratory effort is poor. No focal infiltrate is seen. No bony abnormality is noted. IMPRESSION: Mild vascular congestion. Electronically Signed   By: Inez Catalina M.D.   On: 03/28/2020 01:30   DG Abdomen 1 View  Result Date: 03/28/2020 CLINICAL DATA:  Intubation and nasogastric tube placement EXAM: ABDOMEN - 1 VIEW COMPARISON:  None similar FINDINGS: The nasogastric tube tip overlaps the stomach. Diffuse desiccated stool. Nonobstructive bowel gas pattern. No concerning mass effect or gas collection. IMPRESSION: 1. The enteric tube tip is at the stomach. 2. Desiccated stool retention. Electronically Signed   By: Monte Fantasia M.D.   On: 03/28/2020 04:32   CT Head Wo Contrast  Result Date: 03/28/2020 CLINICAL DATA:  Mental status change.  Unknown cause. EXAM: CT HEAD WITHOUT CONTRAST TECHNIQUE: Contiguous axial images were obtained from the base of the skull through the vertex without intravenous contrast. COMPARISON:  CT dated February 21, 2013. FINDINGS: Brain: No hemorrhage. No extraaxial collection.No midline shift. There is ex vacuo dilatation of the right lateral ventricle. The basal cisterns are unremarkable. Enlarged old MCA territory infarct  is noted on the patient's right. There is Wallerian degeneration. The cerebellum is unremarkable. The sella is unremarkable. Again noted is an extra-axial para falcine mass posteriorly measuring approximately 1.2 cm, stable from 2014. There is a stable amount of surrounding vasogenic edema. Vascular: No hyperdense vessel or unexpected calcification. Skull: The patient is status post prior right frontoparietal craniotomy. The skull base is unremarkable. The visualized upper cervical spine is unremarkable. Sinuses/Orbits: The visualized orbits are unremarkable. The paranasal sinuses are unremarkable. The mastoid air cells are clear. Other: The visualized parotid gland is unremarkable. There is no scalp soft tissue swelling. IMPRESSION: 1. No acute intracranial abnormality. 2. Old right MCA territory infarct. 3. Stable extra-axial para falcine mass, consistent with a meningioma. Electronically Signed   By: Constance Holster M.D.   On: 03/28/2020 01:49   CT ANGIO CHEST PE W OR WO CONTRAST  Result Date: 03/28/2020 CLINICAL DATA:  PE suspected altered mental status, fevers, hypoxia, intubated, coffee-ground emesis noted EXAM: CT ANGIOGRAPHY CHEST WITH CONTRAST TECHNIQUE: Multidetector CT imaging of the chest was performed using the standard protocol during bolus administration of intravenous contrast. Multiplanar CT image reconstructions and MIPs were obtained to evaluate the vascular anatomy. CONTRAST:  155mL OMNIPAQUE IOHEXOL 350 MG/ML SOLN COMPARISON:  None. FINDINGS: Cardiovascular: Examination for pulmonary embolism is somewhat limited by motion artifact. Within this limitation, no evidence of pulmonary embolism through the proximal segmental pulmonary arterial level. Normal heart size. No pericardial effusion. Mediastinum/Nodes: No enlarged mediastinal, hilar, or axillary lymph nodes. Endotracheal intubation. Thyroid gland is normal. Frothy debris in the upper esophagus. Lungs/Pleura: Very dense consolidations  and or atelectasis of the dependent bilateral lower lobes, with additional consolidation of the right middle lobe and lingula, and scattered heterogeneous and ground-glass airspace opacities elsewhere. Small bilateral pleural effusions. Upper Abdomen: Please see separately reported CT of the abdomen and pelvis. Musculoskeletal: No chest wall abnormality. No acute or significant osseous findings. Review of the MIP images confirms the above findings. IMPRESSION: 1. Examination for pulmonary embolism is somewhat limited by motion artifact. Within this limitation, no evidence of pulmonary embolism through the proximal segmental pulmonary arterial level. 2. Very dense consolidations and or atelectasis of the dependent bilateral lower lobes, with additional consolidation of the right middle lobe and lingula, and scattered heterogeneous and ground-glass airspace opacities elsewhere. Findings are consistent with multifocal infection and/or aspiration. 3. Small  associated bilateral pleural effusions. 4. Endotracheal intubation. Electronically Signed   By: Eddie Candle M.D.   On: 03/28/2020 09:52   CT ABDOMEN PELVIS W CONTRAST  Result Date: 03/28/2020 CLINICAL DATA:  Altered mental status, fevers, hypoxia, coffee-ground emesis, hemoglobin of 6 EXAM: CT ABDOMEN AND PELVIS WITH CONTRAST TECHNIQUE: Multidetector CT imaging of the abdomen and pelvis was performed using the standard protocol following bolus administration of intravenous contrast. CONTRAST:  154mL OMNIPAQUE IOHEXOL 350 MG/ML SOLN COMPARISON:  CT abdomen pelvis, 11/03/2010 FINDINGS: Lower chest: No acute abnormality. Hepatobiliary: No solid liver abnormality is seen. No gallstones, gallbladder wall thickening, or biliary dilatation. Pancreas: Unremarkable. No pancreatic ductal dilatation or surrounding inflammatory changes. Spleen: Normal in size without significant abnormality. Adrenals/Urinary Tract: Adrenal glands are unremarkable. Kidneys are normal, without  renal calculi, solid lesion, or hydronephrosis. Foley catheter in the urinary bladder. Stomach/Bowel: Esophagogastric tube with tip and side port below the diaphragm. Appearance of the pylorus suggests mucosal edema and ulceration (series 2, image 30, series 4, image 29). Appendix appears normal. No evidence of bowel wall thickening, distention, or inflammatory changes. Moderate burden of stool and stool balls throughout the colon. Vascular/Lymphatic: No significant vascular findings are present. No enlarged abdominal or pelvic lymph nodes. Reproductive: Uterine fibroids. Other: No abdominal wall hernia or abnormality. No abdominopelvic ascites. Musculoskeletal: No acute or significant osseous findings. IMPRESSION: 1. Appearance of the pylorus suggests mucosal edema and ulceration. Consider direct endoscopic visualization to evaluate for bleeding peptic ulcer in the setting of coffee-ground emesis and anemia. 2. Esophagogastric tube with tip and side port below the diaphragm. 3. Moderate burden of stool and stool balls throughout the colon. 4. Uterine fibroids. Electronically Signed   By: Eddie Candle M.D.   On: 03/28/2020 10:06   DG CHEST PORT 1 VIEW  Result Date: 03/31/2020 CLINICAL DATA:  Pneumonia. EXAM: PORTABLE CHEST 1 VIEW COMPARISON:  03/29/2020. FINDINGS: Interim extubation and removal NG tube. Right PICC line stable position. Heart size normal. Low lung volumes. Bibasilar atelectasis and infiltrates again noted. No interim change. No pleural effusion or pneumothorax. IMPRESSION: 1. Interim extubation removal of NG tube. Right PICC line in stable position. 2. Low lung volumes. Bibasilar atelectasis and infiltrates again noted. No interim change. Electronically Signed   By: Marcello Moores  Register   On: 03/31/2020 06:41   DG Chest Port 1 View  Result Date: 03/29/2020 CLINICAL DATA:  Acute respiratory failure. EXAM: PORTABLE CHEST 1 VIEW COMPARISON:  CT chest and chest x-ray 03/28/2020. FINDINGS: Interim  placement of PICC line, its tip is at the cavoatrial junction. Endotracheal tube and NG tube in stable position. Heart size stable. Low lung volumes with bibasilar atelectasis/infiltrates. No prominent pleural effusion or pneumothorax. IMPRESSION: 1. Interim placement of PICC line, its tip is at the cavoatrial junction. Endotracheal tube and NG tube in stable position. 2.  Low lung volumes with bibasilar atelectasis/infiltrates. Electronically Signed   By: Marcello Moores  Register   On: 03/29/2020 06:17   DG Chest Portable 1 View  Result Date: 03/28/2020 CLINICAL DATA:  Intubation EXAM: PORTABLE CHEST 1 VIEW COMPARISON:  03/28/2020 FINDINGS: Endotracheal tube with tip at the clavicular heads. The enteric tube reaches the stomach. Low volume chest with streaky density on both sides. No edema, effusion, or pneumothorax. Borderline heart size. IMPRESSION: 1. Unremarkable hardware positioning. 2. Low volume chest with perihilar atelectasis or infiltrates. Electronically Signed   By: Monte Fantasia M.D.   On: 03/28/2020 04:34   EEG adult  Result Date: 03/28/2020 Lora Havens,  MD     03/28/2020 11:18 AM Patient Name: Jordan Rivas MRN: [id number] Epilepsy Attending: Lora Havens Referring Physician/Provider: Dr. Marshell Garfinkel Date: 03/28/2020 Duration: 23.34 minutes Patient history: 59 year old female with right hemispheric for stroke due to brain aneurysm, seizure disorder who presented with altered mental status.  EEG evaluate for seizures. Level of alertness: Comatose AEDs during EEG study: Keppra, propofol Technical aspects: This EEG study was done with scalp electrodes positioned according to the 10-20 International system of electrode placement. Electrical activity was acquired at a sampling rate of 500Hz  and reviewed with a high frequency filter of 70Hz  and a low frequency filter of 1Hz . EEG data were recorded continuously and digitally stored. Description: EEG showed continuous generalized 3 to 5 Hz  theta-delta slowing.  Hyperventilation and photic stimulation were not performed.   ABNORMALITY -Continuous slow, generalized IMPRESSION: This study is suggestive of severe diffuse encephalopathy, nonspecific etiology but could be related to sedation.  No seizures or epileptiform discharges were seen throughout the recording. Priyanka O Yadav   [place] EKG SITE RITE  Result Date: 03/28/2020 If [facility] image not attached, placement could not be confirmed due to current cardiac rhythm.      LAB RESULTS: Basic Metabolic Panel: Recent Labs  Lab 03/30/20 0414 03/31/20 0449 04/01/20 0454 04/02/20 0336  NA 141   < > 140 140  K 2.9*   < > 3.3* 3.2*  CL 103   < > 105 107  CO2 27   < > 26 26  GLUCOSE 135*   < > 100* 102*  BUN 6*   < > 8 11  CREATININE 0.61   < > 0.55 0.65  CALCIUM 8.4*   < > 8.9 8.7*  MG 2.1  --   --   --   PHOS 2.6  --   --   --    < > = values in this interval not displayed.   Liver Function Tests: Recent Labs  Lab 03/28/20 0220  AST 13*  ALT 17  ALKPHOS 49  BILITOT 0.6  PROT 3.1*  ALBUMIN 1.5*   No results for input(s): LIPASE, AMYLASE in the last 168 hours. No results for input(s): AMMONIA in the last 168 hours. CBC: Recent Labs  Lab 03/28/20 0220 03/28/20 1010 04/01/20 0454 04/01/20 0454 04/02/20 0336  WBC 9.6   < > 13.4*  --  12.9*  NEUTROABS 8.2*  --   --   --   --   HGB 6.0*   < > 10.9*  --  10.9*  HCT 21.4*   < > 34.9*  --  35.6*  MCV 87.0   < > 78.8*   < > 79.6*  PLT 144*   < > 310  --  325   < > = values in this interval not displayed.   Cardiac Enzymes: No results for input(s): CKTOTAL, CKMB, CKMBINDEX, TROPONINI in the last 168 hours. BNP: Invalid input(s): POCBNP CBG: Recent Labs  Lab 04/02/20 0710 04/02/20 1129  GLUCAP 94 119*       Disposition and Follow-up: Discharge Instructions    Diet - low sodium heart healthy   Complete by: As directed    Increase activity slowly   Complete by: As directed         DISPOSITION: Home with home health PT OT,   [place]    Mayra Neer, MD. Schedule an appointment as soon as possible for a visit in 2 week(s).  Specialty: Family Medicine Contact information: [address] [place] [address] [place], [place] Follow up.   Why: agency will provide home health physical and occupational therapy. Contact information: [place] [address] [phone]                Time coordinating discharge:  35 minutes  Signed:   Estill Cotta M.D. [facility] 04/02/2020, 1:11 PM

## 2020-04-02 NOTE — Progress Notes (Signed)
RUA PICC removed per order.  Site without signs of infection or bleeding noted.  Site cleaned with CHG, covered with vaseline guaze and dry 2x2.  WNL.  Pressure applied x 25minutes.  Pt verbalizes understanding of dressing care, interventions for bleeding and when to call MD or come to ED via teachback method.

## 2020-04-04 LAB — CULTURE, RESPIRATORY W GRAM STAIN

## 2020-04-12 ENCOUNTER — Telehealth: Payer: Self-pay | Admitting: Neurology

## 2020-04-12 NOTE — Telephone Encounter (Signed)
Please let patient know that she is on schedule for BOTOX Injection on Sept 1 2021, will discuss migraine treatment then, may even do BOTOX injection as migraine prevention

## 2020-04-12 NOTE — Telephone Encounter (Signed)
I called the pt. She reports she is unable to take the aimovig. She sts she is uncomfortable with injection herself and if a family/friend cannot be present to administer the med then she will not take.  She sts she did not take her injection the month of July and has not picked the August injection. She would like to switch back to oral meds if the doctor is agreeable.

## 2020-04-12 NOTE — Telephone Encounter (Signed)
Pt called Erenumab-aooe ([facility]) 36 MG/ML SOAJ is not working. Can you switch medication to pill form?

## 2020-04-13 NOTE — Telephone Encounter (Signed)
I called the pt and advised of Dr. Rhea Belton recommendation. She verbalized understanding and is agreeable to waiting until her botox appt in sept to discuss her treatment for migraines further.

## 2020-04-20 ENCOUNTER — Ambulatory Visit: Payer: Medicare Other | Admitting: Neurology

## 2020-04-28 ENCOUNTER — Telehealth: Payer: Self-pay | Admitting: Neurology

## 2020-04-28 NOTE — Telephone Encounter (Signed)
I spoke to the patient. She is currently on Aimovig 70mg  monthly for her prevention of her migraines. She is having difficulty finding someone to give her the injections on time. She has an appt on 05/04/20 with Dr. Krista Blue. She would like to discuss a transition to an oral agent.

## 2020-04-28 NOTE — Telephone Encounter (Signed)
pt asking for something to be called in pill form for her headaches

## 2020-05-03 NOTE — Telephone Encounter (Signed)
Pt received Botox 500 units. Per vo by Dr. Krista Blue, please get the medication approved and rescheduled.

## 2020-05-03 NOTE — Telephone Encounter (Signed)
Patient has new insurance [facility]) and we were not notified prior to her appointment. I called [employer] [phone]) and spoke with Rolan Bucco to see if [phone] and [phone] require PA. He states [phone] will require PA. Reference #[id number]. I have completed PA form and will give to MD to sign. She can not have her Botox injection tomorrow unless MD would like to use samples. Please advise and I will call patient to reschedule.

## 2020-05-04 ENCOUNTER — Ambulatory Visit: Payer: Medicare Other | Admitting: Neurology

## 2020-05-04 NOTE — Telephone Encounter (Signed)
Received signed PA form. Faxed to [facility]. Waiting for response.

## 2020-05-04 NOTE — Telephone Encounter (Signed)
I called patient and informed her of the issue with insurance. She understood and we rescheduled for 9/15.

## 2020-05-10 ENCOUNTER — Telehealth: Payer: Self-pay | Admitting: Neurology

## 2020-05-10 NOTE — Telephone Encounter (Signed)
Pt called wanting to know if her Erenumab-aooe (AIMOVIG) 70 MG/ML SOAJ can be switched to a pill form due to not having anyone to help her administer the injection for her. Please advise.

## 2020-05-10 NOTE — Telephone Encounter (Signed)
I spoke to the patient. She has a pending appt on 05/18/20 that she plans to keep. She would like to discuss other migraine treatment options with Dr. Krista Blue. She is having a hard time finding someone to give her the Aimovig injections and she is not able to do it herself. She feels something oral will be easier.

## 2020-05-16 NOTE — Telephone Encounter (Signed)
I called [employer] to check the status because I have not heard anything back. I spoke with Ray, who states the request was denied due to patient not meeting requirements. They state patient has not taken the required medications. He states that I can fax the appeal form to [phone]) along with office notes to get the denial appealed. Reference #[id number]. I filled out the appeal form and faxed it along with clinical notes and a medication list.

## 2020-05-17 NOTE — Telephone Encounter (Addendum)
Received approval letter today from [facility]. The letter states that Botox has been approved under the patient's Medicare Part D benefit. 200 units approved from 05/16/20 until further notice per approval letter.  **Correction- 200 unit vial x 3 per 90 days.

## 2020-05-18 ENCOUNTER — Ambulatory Visit (INDEPENDENT_AMBULATORY_CARE_PROVIDER_SITE_OTHER): Payer: Medicare (Managed Care) | Admitting: Neurology

## 2020-05-18 ENCOUNTER — Encounter: Payer: Self-pay | Admitting: Neurology

## 2020-05-18 ENCOUNTER — Other Ambulatory Visit: Payer: Self-pay

## 2020-05-18 VITALS — BP 124/72 | HR 90

## 2020-05-18 DIAGNOSIS — G43709 Chronic migraine without aura, not intractable, without status migrainosus: Secondary | ICD-10-CM

## 2020-05-18 DIAGNOSIS — I69354 Hemiplegia and hemiparesis following cerebral infarction affecting left non-dominant side: Secondary | ICD-10-CM | POA: Diagnosis not present

## 2020-05-18 DIAGNOSIS — G40909 Epilepsy, unspecified, not intractable, without status epilepticus: Secondary | ICD-10-CM | POA: Diagnosis not present

## 2020-05-18 DIAGNOSIS — IMO0002 Reserved for concepts with insufficient information to code with codable children: Secondary | ICD-10-CM

## 2020-05-18 MED ORDER — BUTALBITAL-APAP-CAFFEINE 50-325-40 MG PO TABS
1.0000 | ORAL_TABLET | Freq: Four times a day (QID) | ORAL | 5 refills | Status: DC | PRN
Start: 2020-05-18 — End: 2022-02-08

## 2020-05-18 MED ORDER — ONABOTULINUMTOXINA 100 UNITS IJ SOLR
600.0000 [IU] | Freq: Once | INTRAMUSCULAR | Status: AC
  Administered 2020-05-18: 600 [IU] via INTRAMUSCULAR

## 2020-05-18 MED ORDER — LEVETIRACETAM 750 MG PO TABS: 750.0000 mg | ORAL_TABLET | Freq: Two times a day (BID) | ORAL | 4 refills | Status: DC

## 2020-05-18 NOTE — Progress Notes (Signed)
Botox- 200 units x 3 vials Lot: [id number] Expiration: 01/2023 NDC: 3935-9409-05

## 2020-05-18 NOTE — Progress Notes (Signed)
PATIENT: Jordan Rivas DOB: [birth year]  HISTORICAL  Jordan Rivas is on 59 years old African American female, accompanied by her daughter for evaluation of seizure, left spastic hemiparesis  She had a history of right hemisphere hemorrhagic stroke due to brain aneurysm per patient at age 39, was residual severe left spastic hemiparesis, left visual field cut, profound gait difficulty,  She presented with her only seizure on Jan 26 2013, she woke up in the middle of the night using bathroom, while walking towards the bathroom, she fell, with witnessed seizure-like activity, tongue biting, woke up confused, at the hospital  MRI of the brain demonstrate extensive encephalomalacia involving right basal ganglion, perisylvian fissure region, no acute lesions, she has been treated with Keppra 500 mg twice a day, she has no recurrent seizure,  She continues to complain deep body aching pain at the left side, involving her left upper and lower extremity, specificity, difficulty straightening up her left finger, left leg spasm, she reported improvement of her left lower extremity spasm after receiving Botox injection in the past, but it was many years ago,  She also complains of couple years history of frequent headaches, getting worse almost daily basis, she has been taking frequent Excedrin migraines, left retro-orbital area severe pounding headache was associated light, noise sensitivity,  She has been receiving EMG guided botulinum toxin injection for her spastic left hemiparesis every 3 months since July 2015.  She is referred back by her primary care today for evaluation of possible recurrent seizure, October 17, 18, she woke up, her body was 90 degree from her usual sleeping position, 1 night she fell out of the bed do not know why, she denies tongue biting, no incontinence, she has been compliant with her Keppra 500 mg twice daily.  We personally reviewed MRI of the brain in 2013, large  infarction within the right basal ganglion, insular, right frontoparietal region,  UPDATE Oct 14 2019: She had no recurrent seizure, responded very well to previous, electrical stimulation guided Botox injection for spastic left upper extremity, she can move her shoulder, open up her finger much better  UPDATE Sept 15 2021: She responded well to previous injection, no significant side effect noted, Botox injection has helped relax her left shoulder, hands, arms  She also complains of frequent migraine headaches 3-4 times each month, mostly centered at the right side, usually not responding to over-the-counter Tylenol or ibuprofen  REVIEW OF SYSTEMS: Full 14 system review of systems performed and notable only for: as above. All rest review of systems were negative.  ALLERGIES: Allergies  Allergen Reactions  . Celebrex [Celecoxib] Swelling and Other (See Comments)    "lips; it was terrible"  . Baclofen Swelling  . Aspirin   . Zolpidem     HOME MEDICATIONS: Current Outpatient Medications on File Prior to Visit  Medication Sig Dispense Refill  . acetaminophen (TYLENOL) 325 MG tablet Take 650 mg by mouth every 6 (six) hours as needed for mild pain or headache.    . Alcohol Swabs (ALCOHOL PREP) 70 % PADS     . amitriptyline (ELAVIL) 100 MG tablet Take 100 mg by mouth at bedtime.   0  . AQUALANCE LANCETS 30G MISC     . aspirin 81 MG chewable tablet Chew 1 tablet (81 mg total) by mouth daily. 30 tablet 3  . atorvastatin (LIPITOR) 80 MG tablet Take 80 mg by mouth daily.    . benzonatate (TESSALON) 100 MG capsule Take 1  capsule (100 mg total) by mouth 3 (three) times daily as needed for cough. 20 capsule 0  . Blood Glucose Calibration (ONETOUCH VERIO) SOLN     . botulinum toxin Type A (BOTOX) 100 units SOLR injection Inject 500 Units into the muscle every 3 (three) months. 5 each 3  . Cholecalciferol (VITAMIN D) 1000 UNITS capsule Take 1,000 Units by mouth daily.    . DULoxetine (CYMBALTA)  60 MG capsule Take 60 mg by mouth daily.    Eduard Roux (AIMOVIG) 70 MG/ML SOAJ Inject 70 mg into the skin every 30 (thirty) days. 1 pen 11  . esomeprazole (NEXIUM) 40 MG capsule Take 40 mg by mouth 2 (two) times daily.    Marland Kitchen glimepiride (AMARYL) 2 MG tablet Take 2 mg by mouth every morning.    Marland Kitchen guaiFENesin-dextromethorphan (ROBITUSSIN DM) 100-10 MG/5ML syrup Take 5 mLs by mouth every 4 (four) hours as needed for cough. 118 mL 0  . ketoconazole (NIZORAL) 2 % cream Apply 1 application topically 2 (two) times daily as needed for irritation.    . levETIRAcetam (KEPPRA) 750 MG tablet Take 1 tablet (750 mg total) by mouth 2 (two) times daily. 180 tablet 4  . lidocaine (XYLOCAINE) 5 % ointment Apply 1 application topically 4 (four) times daily as needed for moderate pain.     . metoprolol succinate (TOPROL-XL) 25 MG 24 hr tablet Take 1 tablet (25 mg total) by mouth daily. 30 tablet 3  . ONETOUCH VERIO test strip     . oxybutynin (DITROPAN-XL) 10 MG 24 hr tablet Take 10 mg by mouth 2 (two) times daily.    Marland Kitchen oxyCODONE (ROXICODONE) 15 MG immediate release tablet Take 15 mg by mouth 4 (four) times daily as needed for pain.     . pregabalin (LYRICA) 75 MG capsule Take 1 capsule (75 mg total) by mouth 2 (two) times daily. TAKE 1 CAPSULE BY MOUTH THREE TIMES DAILY 60 capsule 1  . tiZANidine (ZANAFLEX) 2 MG tablet TAKE 2 TABLETS BY MOUTH FOUR TIMES DAILY AS NEEDED FOR MUSCLE SPASMS (Patient taking differently: Take 4 mg by mouth 4 (four) times daily as needed for muscle spasms. TAKE 2 TABLETS BY MOUTH FOUR TIMES DAILY AS NEEDED FOR MUSCLE SPASMS) 720 tablet 1   No current facility-administered medications on file prior to visit.    PAST MEDICAL HISTORY: Past Medical History:  Diagnosis Date  . Chronic pain    "in this left arm and leg that don't move"  . CVA (cerebrovascular accident) ([place]) 1995   "left side paralyzed"  . Dental caries   . Depression   . Diabetes mellitus without complication ([place])    . GERD (gastroesophageal reflux disease)   . Headache   . Hypercholesteremia   . Hypertension   . Insomnia   . Neuropathy   . Overactive bladder   . Peripheral vascular disease ([place])    left arm and leg; since stroke 1995  . Pneumonia   . Seizures ([place]) 01/27/12   "first one ever"    PAST SURGICAL HISTORY: Past Surgical History:  Procedure Laterality Date  . CEREBRAL ANEURYSM REPAIR  11/1993   "left my left side paralyzed"  . DENTAL RESTORATION/EXTRACTION WITH X-RAY N/A 09/13/2017   Procedure: DENTAL RESTORATION/EXTRACTION;  Surgeon: Diona Browner, DDS;  Location: [facility];  Service: Oral Surgery;  Laterality: N/A;  . ESOPHAGOGASTRODUODENOSCOPY N/A 03/28/2020   Procedure: ESOPHAGOGASTRODUODENOSCOPY (EGD);  Surgeon: Clarene Essex, MD;  Location: [facility];  Service: Endoscopy;  Laterality: N/A;  bedside endoscopy  . TUBAL LIGATION  04/1984    FAMILY HISTORY: Family History  Problem Relation Age of Onset  . Dementia Mother   . Diabetes type II Mother   . Diabetes Mother   . Breast cancer Mother   . Heart attack Father     SOCIAL HISTORY:  Social History   Socioeconomic History  . Marital status: Divorced    Spouse name: Not on file  . Number of children: 2  . Years of education: 63  . Highest education level: High school graduate  Occupational History  . Occupation: Disabled  Tobacco Use  . Smoking status: Never Smoker  . Smokeless tobacco: Never Used  Vaping Use  . Vaping Use: Never used  Substance and Sexual Activity  . Alcohol use: No  . Drug use: No  . Sexual activity: Yes  Other Topics Concern  . Not on file  Social History Narrative   Lives at home with her grandson.   Right-handed.   No daily caffeine use.   Social Determinants of Health   Financial Resource Strain:   . Difficulty of Paying Living Expenses: Not on file  Food Insecurity:   . Worried About Charity fundraiser in the Last Year: Not on file  . Ran Out of Food in the Last Year: Not on  file  Transportation Needs:   . Lack of Transportation (Medical): Not on file  . Lack of Transportation (Non-Medical): Not on file  Physical Activity:   . Days of Exercise per Week: Not on file  . Minutes of Exercise per Session: Not on file  Stress:   . Feeling of Stress : Not on file  Social Connections:   . Frequency of Communication with Friends and Family: Not on file  . Frequency of Social Gatherings with Friends and Family: Not on file  . Attends Religious Services: Not on file  . Active Member of Clubs or Organizations: Not on file  . Attends Archivist Meetings: Not on file  . Marital Status: Not on file  Intimate Partner Violence:   . Fear of Current or Ex-Partner: Not on file  . Emotionally Abused: Not on file  . Physically Abused: Not on file  . Sexually Abused: Not on file     PHYSICAL EXAM   Vitals:   05/18/20 1308  BP: 124/72  Pulse: 90  SpO2: 94%   Not recorded     There is no height or weight on file to calculate BMI.   Neurological examination   PHYSICAL EXAMNIATION:  Spastic left hemiparesis, left hand thumb in position, proximal and distal strength is 1 out of 5, left lower extremity 1 out of 5, tightness of left shoulder, limited range of motion with passive stretch, fairly normal range of motion of left elbow,  Left knee extension, ankle plantarflexion, she has noticeable left shoulder pain with passive movement,    She needs push-up to get up from seated position, spastic left hemiparesis, dragging her left leg  ASSESSMENT AND PLAN  LAURIN PAULO is a 59 y.o. female complains of  right hemisphere hemorrhagic stroke, at age 85, in 2003 Spastic left hemiparesis  MRI of the brain in 2013 showed old infarction within the right basal ganglion, insula, right frontoparietal region  Electrical stimulation guided Botox injection for spastic left upper extremity, we used Botox A 600 units   Left pronator teres 50 units Left flexor  digitorum profundus 50 units Left flexor digitorum superficialis 50  units Left palmaris longus 25 units Left opponents 25 units Left brachialis 100 Left pectoralis major 100 units Left latissimus dorsi 100 units Partial seizure  Has not had recurrent seizure for many years, keep on Keppra 750 mg twice a day, refill prescription  Chronic migraine headaches  Already on preventive medications amitriptyline 100 mg at bedtime, Aimovig 70 mg monthly,  Botox injection as migraine prevention today, use 100 units  5 units of Botox was injected into each side,  Right temporalis 8 injection sites Right occipitalis 6 injection sites Right cervical paraspinals 2 injection sites Right upper trapezius 4 injection sites  Fioricet as needed  Marcial Pacas, M.D. Ph.D.  [facility] [address] [place], [place] [address] [phone]

## 2020-06-09 ENCOUNTER — Other Ambulatory Visit: Payer: Self-pay | Admitting: Family Medicine

## 2020-06-09 DIAGNOSIS — Z1231 Encounter for screening mammogram for malignant neoplasm of breast: Secondary | ICD-10-CM

## 2020-06-24 ENCOUNTER — Other Ambulatory Visit: Payer: Self-pay | Admitting: Neurology

## 2020-07-25 ENCOUNTER — Ambulatory Visit: Payer: Medicare (Managed Care) | Admitting: Podiatry

## 2020-08-17 ENCOUNTER — Ambulatory Visit: Payer: Medicare (Managed Care) | Admitting: Neurology

## 2020-08-17 ENCOUNTER — Telehealth: Payer: Self-pay | Admitting: *Deleted

## 2020-08-17 NOTE — Telephone Encounter (Signed)
No showed Botox appointment. 

## 2020-08-22 ENCOUNTER — Telehealth: Payer: Self-pay | Admitting: *Deleted

## 2020-08-22 NOTE — Telephone Encounter (Signed)
PA for Aimovig 70mg  started on covermymeds (key: [id number]). Pt has coverage through [facility] (HB#[id number]). Decision pending.

## 2020-08-23 ENCOUNTER — Other Ambulatory Visit: Payer: Self-pay

## 2020-08-23 ENCOUNTER — Ambulatory Visit (INDEPENDENT_AMBULATORY_CARE_PROVIDER_SITE_OTHER): Payer: Medicare (Managed Care) | Admitting: Podiatry

## 2020-08-23 DIAGNOSIS — M2041 Other hammer toe(s) (acquired), right foot: Secondary | ICD-10-CM

## 2020-08-23 DIAGNOSIS — M21372 Foot drop, left foot: Secondary | ICD-10-CM | POA: Diagnosis not present

## 2020-08-23 DIAGNOSIS — E1149 Type 2 diabetes mellitus with other diabetic neurological complication: Secondary | ICD-10-CM

## 2020-08-23 DIAGNOSIS — M2042 Other hammer toe(s) (acquired), left foot: Secondary | ICD-10-CM

## 2020-08-23 DIAGNOSIS — M79672 Pain in left foot: Secondary | ICD-10-CM | POA: Diagnosis not present

## 2020-08-23 NOTE — Telephone Encounter (Signed)
PA approved "until further notice".

## 2020-08-29 NOTE — Progress Notes (Signed)
Subjective: 59 year old female presents the office today requesting diabetic shoes.  She does have some left foot pain but she feels that new shoes will be helpful.  Her shoes are worn out.  She is diabetic and last A1c was 7.1 on March 28, 2020.  She does have neuropathy.  Last morning blood sugar was 130 which is about average for her she reports.  She denies any ulcerations. Denies any systemic complaints such as fevers, chills, nausea, vomiting. No acute changes since last appointment, and no other complaints at this time.   Objective: AAO x3, NAD DP/PT pulses palpable bilaterally, CRT less than 3 seconds Sensation absent with Semmes Weinstein monofilament left foot drop was present left foot as well.  She does sit in a slight plantarflexed position at the ankle which is rigid.  There is no area of pinpoint tenderness but she does get some mild diffuse tenderness submetatarsal area.  No increased edema to the left compared to the contralateral extremity.  Hammertoes present.  There is no erythema or warmth.  No pain with calf compression, swelling, warmth, erythema  Assessment: 59 year old female with type 2 diabetes with neuropathy, dropfoot  Plan: -All treatment options discussed with the patient including all alternatives, risks, complications.  -I do think that new shoes were beneficial for her ulcers are worn out.  She was measured for new diabetic shoes today.  Continue with the brace as well.  I do think the majority of symptoms coming from the position of the ankle.  Offloading pads for now. -Patient encouraged to call the office with any questions, concerns, change in symptoms.   Vivi Barrack DPM

## 2020-09-09 ENCOUNTER — Other Ambulatory Visit: Payer: Self-pay

## 2020-09-09 ENCOUNTER — Ambulatory Visit (INDEPENDENT_AMBULATORY_CARE_PROVIDER_SITE_OTHER): Payer: Medicare (Managed Care) | Admitting: Orthotics

## 2020-09-09 DIAGNOSIS — E1149 Type 2 diabetes mellitus with other diabetic neurological complication: Secondary | ICD-10-CM

## 2020-09-10 ENCOUNTER — Other Ambulatory Visit: Payer: Self-pay | Admitting: Neurology

## 2020-10-10 ENCOUNTER — Ambulatory Visit: Payer: Medicare (Managed Care) | Admitting: Orthotics

## 2020-11-08 ENCOUNTER — Ambulatory Visit (INDEPENDENT_AMBULATORY_CARE_PROVIDER_SITE_OTHER): Payer: 59 | Admitting: Podiatry

## 2020-11-08 ENCOUNTER — Other Ambulatory Visit: Payer: Self-pay

## 2020-11-08 DIAGNOSIS — M2041 Other hammer toe(s) (acquired), right foot: Secondary | ICD-10-CM

## 2020-11-08 DIAGNOSIS — M2042 Other hammer toe(s) (acquired), left foot: Secondary | ICD-10-CM | POA: Diagnosis not present

## 2020-11-08 DIAGNOSIS — D320 Benign neoplasm of cerebral meninges: Secondary | ICD-10-CM | POA: Insufficient documentation

## 2020-11-08 DIAGNOSIS — Z8679 Personal history of other diseases of the circulatory system: Secondary | ICD-10-CM | POA: Insufficient documentation

## 2020-11-08 DIAGNOSIS — N3281 Overactive bladder: Secondary | ICD-10-CM | POA: Insufficient documentation

## 2020-11-08 DIAGNOSIS — E1140 Type 2 diabetes mellitus with diabetic neuropathy, unspecified: Secondary | ICD-10-CM | POA: Insufficient documentation

## 2020-11-08 DIAGNOSIS — R7401 Elevation of levels of liver transaminase levels: Secondary | ICD-10-CM | POA: Insufficient documentation

## 2020-11-08 DIAGNOSIS — D649 Anemia, unspecified: Secondary | ICD-10-CM | POA: Insufficient documentation

## 2020-11-08 DIAGNOSIS — E1149 Type 2 diabetes mellitus with other diabetic neurological complication: Secondary | ICD-10-CM

## 2020-11-08 DIAGNOSIS — J329 Chronic sinusitis, unspecified: Secondary | ICD-10-CM | POA: Insufficient documentation

## 2020-11-08 DIAGNOSIS — M21372 Foot drop, left foot: Secondary | ICD-10-CM

## 2020-11-08 DIAGNOSIS — I69959 Hemiplegia and hemiparesis following unspecified cerebrovascular disease affecting unspecified side: Secondary | ICD-10-CM | POA: Insufficient documentation

## 2020-11-08 DIAGNOSIS — B370 Candidal stomatitis: Secondary | ICD-10-CM | POA: Insufficient documentation

## 2020-11-08 DIAGNOSIS — F39 Unspecified mood [affective] disorder: Secondary | ICD-10-CM | POA: Insufficient documentation

## 2020-11-08 DIAGNOSIS — M216X2 Other acquired deformities of left foot: Secondary | ICD-10-CM

## 2020-11-08 DIAGNOSIS — R509 Fever, unspecified: Secondary | ICD-10-CM | POA: Insufficient documentation

## 2020-11-08 NOTE — Progress Notes (Signed)
The patient presented to the office today to pick up diabetic shoes ([id number], 8 wide) and 3 pair diabetic custom inserts.  1 pair of inserts were put in the shoes and the shoes were fitted to the patient. The patient states they are comfortable and free of defect. She was satisfied with the fit of the shoe. Instructions for break in and wear were dispensed. The patient signed the delivery documentation and break in instruction form.  If any concerns or questions, she is instructed to call. Other wise she will be seen back for her next scheduled appointment.

## 2020-11-19 ENCOUNTER — Other Ambulatory Visit: Payer: Self-pay | Admitting: Neurology

## 2020-11-23 ENCOUNTER — Ambulatory Visit: Payer: Medicare (Managed Care) | Admitting: Neurology

## 2020-12-19 ENCOUNTER — Other Ambulatory Visit: Payer: Self-pay | Admitting: Family Medicine

## 2020-12-19 DIAGNOSIS — Z1231 Encounter for screening mammogram for malignant neoplasm of breast: Secondary | ICD-10-CM

## 2021-01-13 ENCOUNTER — Other Ambulatory Visit: Payer: Self-pay | Admitting: Neurology

## 2021-01-18 ENCOUNTER — Ambulatory Visit (INDEPENDENT_AMBULATORY_CARE_PROVIDER_SITE_OTHER): Payer: 59 | Admitting: Neurology

## 2021-01-18 ENCOUNTER — Encounter: Payer: Self-pay | Admitting: Neurology

## 2021-01-18 VITALS — BP 136/77 | HR 91 | Ht 61.0 in | Wt 200.5 lb

## 2021-01-18 DIAGNOSIS — I69354 Hemiplegia and hemiparesis following cerebral infarction affecting left non-dominant side: Secondary | ICD-10-CM | POA: Diagnosis not present

## 2021-01-18 DIAGNOSIS — G43709 Chronic migraine without aura, not intractable, without status migrainosus: Secondary | ICD-10-CM | POA: Diagnosis not present

## 2021-01-18 DIAGNOSIS — G40909 Epilepsy, unspecified, not intractable, without status epilepticus: Secondary | ICD-10-CM | POA: Diagnosis not present

## 2021-01-18 MED ORDER — AIMOVIG 70 MG/ML ~~LOC~~ SOAJ: 70.0000 mg | SUBCUTANEOUS | 4 refills | Status: DC

## 2021-01-18 MED ORDER — TIZANIDINE HCL 2 MG PO TABS: 4.0000 mg | ORAL_TABLET | Freq: Two times a day (BID) | ORAL | 4 refills | Status: DC

## 2021-01-18 MED ORDER — LEVETIRACETAM 750 MG PO TABS: 750.0000 mg | ORAL_TABLET | Freq: Two times a day (BID) | ORAL | 4 refills | Status: DC

## 2021-01-18 MED ORDER — UBRELVY 50 MG PO TABS: 50.0000 mg | ORAL_TABLET | ORAL | 6 refills | Status: DC | PRN

## 2021-01-18 MED ORDER — ONABOTULINUMTOXINA 100 UNITS IJ SOLR
600.0000 [IU] | Freq: Once | INTRAMUSCULAR | Status: AC
  Administered 2021-01-18: 600 [IU] via INTRAMUSCULAR

## 2021-01-18 NOTE — Progress Notes (Signed)
**  Botox 100 units x 6, NDC 1941-7408-14, Lot [id number], Exp 01/2023, office supply.//mck,rn**

## 2021-01-18 NOTE — Progress Notes (Signed)
ASSESSMENT AND PLAN  Jordan Rivas is a 60 y.o. female with spastic right hemisphere following hemorrhagic stroke, at age 30, in 2003 Spastic left hemiparesis  MRI of the brain in 2013 showed old infarction within the right basal ganglion, insula, right frontoparietal region  Electrical stimulation guided Botox injection for spastic left upper extremity, we used Botox A 600 units   Left pronator teres 50 units Left flexor digitorum profundus 50 units Left flexor digitorum superficialis 50 units Left flexor carpi ulnaris 50 Left palmaris longus 25 units Left opponents 25 units Left brachialis 50 Left pectoralis major 100 units Left latissimus dorsi 100 units  Right levator scapular 25 units Right upper trapezius 25  units Right iliocostalis 25 units Right temporalis divided into 2 injection sites, 25 units total  Partial seizure  Has not had recurrent seizure for many years, keep on Keppra 750 mg twice a day, refill prescription  Chronic migraine headaches  Already on preventive medications amitriptyline 100 mg at bedtime, Aimovig 70 mg monthly,  Botox injection to right temporalis muscle, right cervical paraspinal spinal muscles as migraine prevention  Urevyl 50 units as needed as abortive medication  Stop daily Tylenol use to avoid medicine rebound headache  HISTORICAL  Jordan Rivas is on 60 years old African American female, accompanied by her daughter for evaluation of seizure, left spastic hemiparesis  She had a history of right hemisphere hemorrhagic stroke due to brain aneurysm per patient at age 26, was residual severe left spastic hemiparesis, left visual field cut, profound gait difficulty,  She presented with her only seizure on Jan 26 2013, she woke up in the middle of the night using bathroom, while walking towards the bathroom, she fell, with witnessed seizure-like activity, tongue biting, woke up confused, at the hospital  MRI of the brain demonstrate  extensive encephalomalacia involving right basal ganglion, perisylvian fissure region, no acute lesions, she has been treated with Keppra 500 mg twice a day, she has no recurrent seizure,  She continues to complain deep body aching pain at the left side, involving her left upper and lower extremity, specificity, difficulty straightening up her left finger, left leg spasm, she reported improvement of her left lower extremity spasm after receiving Botox injection in the past, but it was many years ago,  She also complains of couple years history of frequent headaches, getting worse almost daily basis, she has been taking frequent Excedrin migraines, left retro-orbital area severe pounding headache was associated light, noise sensitivity,  She has been receiving EMG guided botulinum toxin injection for her spastic left hemiparesis every 3 months since July 2015.  She is referred back by her primary care today for evaluation of possible recurrent seizure, October 17, 18, she woke up, her body was 90 degree from her usual sleeping position, 1 night she fell out of the bed do not know why, she denies tongue biting, no incontinence, she has been compliant with her Keppra 500 mg twice daily.  We personally reviewed MRI of the brain in 2013, large infarction within the right basal ganglion, insular, right frontoparietal region,  UPDATE Oct 14 2019: She had no recurrent seizure, responded very well to previous, electrical stimulation guided Botox injection for spastic left upper extremity, she can move her shoulder, open up her finger much better  UPDATE Sept 15 2021: She responded well to previous injection, no significant side effect noted, Botox injection has helped relax her left shoulder, hands, arms  She also complains of frequent  migraine headaches 3-4 times each month, mostly centered at the right side, usually not responding to over-the-counter Tylenol or ibuprofen  REVIEW OF SYSTEMS: Full 14  system review of systems performed and notable only for: as above. All rest review of systems were negative.  ALLERGIES: Allergies  Allergen Reactions  . Celebrex [Celecoxib] Swelling and Other (See Comments)    "lips; it was terrible"  . Baclofen Swelling  . Aspirin   . Zolpidem     HOME MEDICATIONS: Current Outpatient Medications on File Prior to Visit  Medication Sig Dispense Refill  . acetaminophen (TYLENOL) 325 MG tablet Take 650 mg by mouth every 6 (six) hours as needed for mild pain or headache.    . Alcohol Swabs (ALCOHOL PREP) 70 % PADS     . amitriptyline (ELAVIL) 100 MG tablet Take 100 mg by mouth at bedtime.   0  . AQUALANCE LANCETS 30G MISC     . aspirin 81 MG chewable tablet Chew 1 tablet (81 mg total) by mouth daily. 30 tablet 3  . atorvastatin (LIPITOR) 80 MG tablet Take 80 mg by mouth daily.    . Azelastine HCl 0.15 % SOLN 1-2 sprays in each nostril    . benzonatate (TESSALON) 100 MG capsule Take 1 capsule (100 mg total) by mouth 3 (three) times daily as needed for cough. 20 capsule 0  . Blood Glucose Calibration (ONETOUCH VERIO) SOLN     . botulinum toxin Type A (BOTOX) 100 units SOLR injection Inject 500 Units into the muscle every 3 (three) months. 5 each 3  . butalbital-acetaminophen-caffeine (FIORICET) 50-325-40 MG tablet Take 1 tablet by mouth every 6 (six) hours as needed for headache. 10 tablet 5  . Cholecalciferol (VITAMIN D) 1000 UNITS capsule Take 1,000 Units by mouth daily.    Marland Kitchen desoximetasone (TOPICORT) 5.00 % cream 1 application Twice a day Externally    . DULoxetine (CYMBALTA) 60 MG capsule Take 60 mg by mouth daily.    Eduard Roux (AIMOVIG) 70 MG/ML SOAJ Inject 70 mg into the skin every 30 (thirty) days. 1 pen 11  . esomeprazole (NEXIUM) 40 MG capsule Take 40 mg by mouth 2 (two) times daily.    Marland Kitchen glimepiride (AMARYL) 4 MG tablet Take 4 mg by mouth every morning.    Marland Kitchen guaiFENesin-dextromethorphan (ROBITUSSIN DM) 100-10 MG/5ML syrup Take 5 mLs by  mouth every 4 (four) hours as needed for cough. 118 mL 0  . ketoconazole (NIZORAL) 2 % cream Apply 1 application topically 2 (two) times daily as needed for irritation.    . levETIRAcetam (KEPPRA) 750 MG tablet Take 1 tablet (750 mg total) by mouth 2 (two) times daily. 180 tablet 4  . lidocaine (XYLOCAINE) 5 % ointment Apply 1 application topically 4 (four) times daily as needed for moderate pain.     . meloxicam (MOBIC) 15 MG tablet Take 15 mg by mouth daily.    . metoprolol succinate (TOPROL-XL) 25 MG 24 hr tablet Take 1 tablet (25 mg total) by mouth daily. 30 tablet 3  . ondansetron (ZOFRAN) 4 MG tablet TAKE 1 TABLET BY MOUTH EVERY 8 HOURS AS NEEDED FOR NAUSEA 20 tablet 5  . ONETOUCH VERIO test strip     . oxyCODONE (ROXICODONE) 15 MG immediate release tablet Take 15 mg by mouth 4 (four) times daily as needed for pain.     . pregabalin (LYRICA) 75 MG capsule Take 1 capsule (75 mg total) by mouth 2 (two) times daily. TAKE 1 CAPSULE BY MOUTH  THREE TIMES DAILY 60 capsule 1  . tiZANidine (ZANAFLEX) 2 MG tablet TAKE 2 TABLETS BY MOUTH FOUR TIMES DAILY AS NEEDED FOR MUSCLE SPASMS (Patient taking differently: Take 4 mg by mouth 4 (four) times daily as needed for muscle spasms. TAKE 2 TABLETS BY MOUTH FOUR TIMES DAILY AS NEEDED FOR MUSCLE SPASMS) 720 tablet 1  . glimepiride (AMARYL) 2 MG tablet Take 2 mg by mouth every morning.     No current facility-administered medications on file prior to visit.    PAST MEDICAL HISTORY: Past Medical History:  Diagnosis Date  . Chronic pain    "in this left arm and leg that don't move"  . CVA (cerebrovascular accident) ([place]) 1995   "left side paralyzed"  . Dental caries   . Depression   . Diabetes mellitus without complication ([place])   . GERD (gastroesophageal reflux disease)   . Headache   . Hypercholesteremia   . Hypertension   . Insomnia   . Neuropathy   . Overactive bladder   . Peripheral vascular disease ([place])    left arm and leg; since stroke  1995  . Pneumonia   . Seizures ([place]) 01/27/12   "first one ever"    PAST SURGICAL HISTORY: Past Surgical History:  Procedure Laterality Date  . CEREBRAL ANEURYSM REPAIR  11/1993   "left my left side paralyzed"  . DENTAL RESTORATION/EXTRACTION WITH X-RAY N/A 09/13/2017   Procedure: DENTAL RESTORATION/EXTRACTION;  Surgeon: Diona Browner, DDS;  Location: [facility];  Service: Oral Surgery;  Laterality: N/A;  . ESOPHAGOGASTRODUODENOSCOPY N/A 03/28/2020   Procedure: ESOPHAGOGASTRODUODENOSCOPY (EGD);  Surgeon: Clarene Essex, MD;  Location: [facility];  Service: Endoscopy;  Laterality: N/A;  bedside endoscopy  . TUBAL LIGATION  04/1984    FAMILY HISTORY: Family History  Problem Relation Age of Onset  . Dementia Mother   . Diabetes type II Mother   . Diabetes Mother   . Breast cancer Mother   . Heart attack Father     SOCIAL HISTORY:  Social History   Socioeconomic History  . Marital status: Divorced    Spouse name: Not on file  . Number of children: 2  . Years of education: 72  . Highest education level: High school graduate  Occupational History  . Occupation: Disabled  Tobacco Use  . Smoking status: Never Smoker  . Smokeless tobacco: Never Used  Vaping Use  . Vaping Use: Never used  Substance and Sexual Activity  . Alcohol use: No  . Drug use: No  . Sexual activity: Yes  Other Topics Concern  . Not on file  Social History Narrative   Lives at home with her grandson.   Right-handed.   No daily caffeine use.   Social Determinants of Health   Financial Resource Strain: Not on file  Food Insecurity: Not on file  Transportation Needs: Not on file  Physical Activity: Not on file  Stress: Not on file  Social Connections: Not on file  Intimate Partner Violence: Not on file     PHYSICAL EXAM   Vitals:   01/18/21 1450  BP: 136/77  Pulse: 91  Weight: 200 lb 8 oz (90.9 kg)  Height: 5\' 1"  (1.549 m)   Not recorded     Body mass index is 37.88  kg/m.   Neurological examination   PHYSICAL EXAMNIATION: CN: Awake, alert, oriented to history taking and casual conversation, endenture Motor: Spastic left hemiparesis, left hand thumb in position, proximal and distal strength is 1 out of 5, left  lower extremity 1 out of 5, tightness of left shoulder, limited range of motion with passive stretch, fairly normal range of motion of left elbow,  Left knee extension, ankle plantarflexion, she has noticeable left shoulder pain with passive movement, Reflexes: Hyperreflexia of left upper and lower extremity   Gait: She needs push-up to get up from seated position, spastic left hemiparesis, dragging her left leg, wearing left AFO,   Marcial Pacas, M.D. Ph.D.  [facility] [address] [place], [place] [address] [phone]

## 2021-01-20 ENCOUNTER — Telehealth: Payer: Self-pay

## 2021-01-20 NOTE — Telephone Encounter (Signed)
Pt called today to cancel appointment schedule for 01/24/21. Appointment has been canceled.

## 2021-01-23 ENCOUNTER — Telehealth: Payer: Self-pay | Admitting: Neurology

## 2021-01-23 NOTE — Telephone Encounter (Signed)
Pt called stating that the migraine medication that was prescribed to her is not covered by her insurance and she is wanting to know if something else can be prescribed to her. Please advise.

## 2021-01-24 ENCOUNTER — Ambulatory Visit: Payer: 59 | Admitting: Podiatry

## 2021-01-24 NOTE — Telephone Encounter (Signed)
Called patient to ask which medication is denied; no answer and no voice mail.

## 2021-01-25 NOTE — Telephone Encounter (Signed)
Called and spoke w/ pt. She does not know which medication was denied. I reviewed her chart. Appears Dr. Krista Blue called in [place] on 01/18/21 for her to try. Advised we will follow up w/ pharmacy to see if it is needing PA, ect. We will update her once we know more. She verbalized understanding.  Called [facility] at [phone]. Spoke w/ tech. PA is needed. ID: [id number] PCN: [id number] Grp: [id number]. Phone# [phone].

## 2021-01-25 NOTE — Telephone Encounter (Signed)
[name] PA, key: [id number], [id number]. Your information has been sent to [facility]

## 2021-01-25 NOTE — Telephone Encounter (Signed)
Ubrelvy 50 mg tablet, use as directed, is approved for a non-formulary exception through 09/02/2021 under your Medicare Part D benefit. Called patient  to advise.

## 2021-02-01 NOTE — Telephone Encounter (Signed)
FYI: Pt called, wanted to let Dr. Krista Blue know Jordan Rivas is working wonderfully.

## 2021-02-09 ENCOUNTER — Telehealth: Payer: Self-pay | Admitting: Neurology

## 2021-02-09 MED ORDER — DEXAMETHASONE 2 MG PO TABS: ORAL_TABLET | ORAL | 0 refills | Status: DC

## 2021-02-09 NOTE — Telephone Encounter (Signed)
Pt called wanting to know if the RN has any samples of Ubrelvy. Pt is not able to fill her prescription yet and is suffering from a migraine right now. Please advise.

## 2021-02-09 NOTE — Telephone Encounter (Signed)
I spoke to the pharmacy who informed me the patient filled Ubrelvy 50mg , #12 on 01/26/21. She is not due for her next refill yet.  I called the patient who reports having a migraine going on two weeks now. She has taken all her Ubrelvy for the month. She did not realize the 12 tabs should be stretched out. She has also tried tizanidine. Mild nausea present but she is getting relief with ondansetron. She has not used any OTC NSAIDS. Fioricet has worked well for her in the past but she no longer has refills. She is also agreeable to an infusion and has transportation available to her today.

## 2021-02-09 NOTE — Telephone Encounter (Signed)
I called the patient.  She is having very frequent headaches, she has gone through her entire month supply of [place].  She has had headache for a least 3 days in a row, wants to have something done about it.  She indicates that prednisone has helped her previously, I will give her 3-day course of Decadron now.

## 2021-02-09 NOTE — Addendum Note (Signed)
Addended by: Kathrynn Ducking on: 02/09/2021 10:57 AM   Modules accepted: Orders

## 2021-02-13 NOTE — Telephone Encounter (Signed)
Pt called, stating I have took all of the 3 tablets of Decadron. I can not get my Urbrely  until next week. Can you prescribe something else until I can get my refill. Would like a call from the nurse.

## 2021-02-13 NOTE — Telephone Encounter (Signed)
Called patient and stated the decadron has not helped at all.  This headache started this morning.  Her husband got on the phone and stated the headaches actually started about 6 months ago.  Go away but come back and alternate between one side of her head to the other.  This morning the headache is on both sides of her head.    She is taking the monthly injection.  The ubrelvy helped until she ran out.  She has taken tylenol but no nsaids.  She thinks the headaches are caused from tension.  She keeps her room dark.  When she lays down the headaches ease up but when gets back up the next day, they come right back.  She said she cannot think of anything that she has tried in the past that has helped the headaches.

## 2021-02-14 NOTE — Telephone Encounter (Signed)
I spoke to the patient this morning. Reports improvement in current migraine since taking Decadron. Over the last six months, she has noticed an increase in migraines. She is still taking amitriptyline 100mg , one tab QHS. Admits to not using Aimovig 70mg  for a "long time". She felt the medication was helpful and does not remember why she stopped it. She has refills on file at the pharmacy. I called [facility] and she last filled the medication in July 2021. She is going to restart the Aimovig 70mg . She is able to pick up the Ubrelvy on 02/19/21 and understands to use them sparingly (allowed 12 tabs each month). She will keep her pending follow up.  Aimovig needed a new PA. She has new insurance. PA started on covermymeds (key: [id number]). Pharmacy coverage through [facility] [phone]). [id number] approved through 09/02/2021.

## 2021-02-15 ENCOUNTER — Ambulatory Visit: Payer: Self-pay | Admitting: Neurology

## 2021-03-07 NOTE — Progress Notes (Addendum)
Patient seen in office today by EJ with [facility] for diabetic shoe measurement and casted for custom inserts. Patient made a shoe selection. Dr. Brigitte Pulse is treating the patient's diabetes at this time. Patient was advised that the office will call when the shoes are available for pick-up. Patient verbalized understanding.

## 2021-03-13 ENCOUNTER — Inpatient Hospital Stay: Admission: RE | Admit: 2021-03-13 | Payer: Medicare (Managed Care) | Source: Ambulatory Visit

## 2021-04-26 ENCOUNTER — Ambulatory Visit (INDEPENDENT_AMBULATORY_CARE_PROVIDER_SITE_OTHER): Payer: 59 | Admitting: Neurology

## 2021-04-26 ENCOUNTER — Encounter: Payer: Self-pay | Admitting: Neurology

## 2021-04-26 VITALS — BP 152/89 | HR 81 | Ht 61.0 in | Wt 201.0 lb

## 2021-04-26 DIAGNOSIS — G40909 Epilepsy, unspecified, not intractable, without status epilepticus: Secondary | ICD-10-CM | POA: Diagnosis not present

## 2021-04-26 DIAGNOSIS — G43709 Chronic migraine without aura, not intractable, without status migrainosus: Secondary | ICD-10-CM | POA: Diagnosis not present

## 2021-04-26 DIAGNOSIS — I69354 Hemiplegia and hemiparesis following cerebral infarction affecting left non-dominant side: Secondary | ICD-10-CM

## 2021-04-26 DIAGNOSIS — R0683 Snoring: Secondary | ICD-10-CM

## 2021-04-26 DIAGNOSIS — R519 Headache, unspecified: Secondary | ICD-10-CM

## 2021-04-26 MED ORDER — UBRELVY 50 MG PO TABS: 50.0000 mg | ORAL_TABLET | ORAL | 6 refills | Status: DC | PRN

## 2021-04-26 MED ORDER — ONABOTULINUMTOXINA 100 UNITS IJ SOLR
600.0000 [IU] | Freq: Once | INTRAMUSCULAR | Status: AC
  Administered 2021-04-26: 600 [IU] via INTRAMUSCULAR

## 2021-04-26 NOTE — Progress Notes (Signed)
**  Botox 100 units x 6 vials, NDC DR:[id number], Lot [id number], Exp 05/2022, office supply.//mck,rn**

## 2021-04-26 NOTE — Progress Notes (Signed)
ASSESSMENT AND PLAN  Jordan Rivas is a 60 y.o. female with spastic right hemisphere following hemorrhagic stroke, at age 27, in 2003 Spastic left hemiparesis  MRI of the brain in 2013 showed old infarction within the right basal ganglion, insula, right frontoparietal region  Electrical stimulation guided Botox injection for spastic left upper extremity, we used Botox A 600 units   Left pronator teres 50 units Left flexor digitorum profundus 50 units Left flexor digitorum superficialis 50 units Left flexor carpi ulnaris 50 Left palmaris longus 25 units Left opponents 25 units Left brachialis 50x2=100 units Left pectoralis major 100 units Left latissimus dorsi 100 units   Left temporalis 5 units eachx 5= 25units Left occipital area 5 units eachx3= 15 units Right temporalis 5 units each x2= 10 units  Partial seizure  Has not had recurrent seizure for many years, keep on Keppra 750 mg twice a day, refill prescription  Chronic migraine headaches  Already on preventive medications amitriptyline 100 mg at bedtime, Aimovig 70 mg monthly,  Botox injection to right temporalis muscle, right cervical paraspinal spinal muscles as migraine prevention  Urevyl 50 units as needed as abortive medication  Stop daily Tylenol use to avoid medicine rebound headache  Snoring,  She has occasionally snoring, also has frequent morning headaches, as day goes by her headache often improved, also has narrow oropharyngeal space  Will refer to sleep study for potential obstructive sleep apnea  HISTORICAL  Jordan Rivas is on 60 years old African American female, accompanied by her daughter for evaluation of seizure, left spastic hemiparesis  She had a history of right hemisphere hemorrhagic stroke due to brain aneurysm per patient at age 41, was residual severe left spastic hemiparesis, left visual field cut, profound gait difficulty,  She presented with her only seizure on Jan 26 2013, she woke up  in the middle of the night using bathroom, while walking towards the bathroom, she fell, with witnessed seizure-like activity, tongue biting, woke up confused, at the hospital  MRI of the brain demonstrate extensive encephalomalacia involving right basal ganglion, perisylvian fissure region, no acute lesions, she has been treated with Keppra 500 mg twice a day, she has no recurrent seizure,  She continues to complain deep body aching pain at the left side, involving her left upper and lower extremity, specificity, difficulty straightening up her left finger, left leg spasm, she reported improvement of her left lower extremity spasm after receiving Botox injection in the past, but it was many years ago,  She also complains of couple years history of frequent headaches, getting worse almost daily basis, she has been taking frequent Excedrin migraines, left retro-orbital area severe pounding headache was associated light, noise sensitivity,  She has been receiving EMG guided botulinum toxin injection for her spastic left hemiparesis every 3 months since July 2015.  She is referred back by her primary care today for evaluation of possible recurrent seizure, October 17, 18, she woke up, her body was 90 degree from her usual sleeping position, 1 night she fell out of the bed do not know why, she denies tongue biting, no incontinence, she has been compliant with her Keppra 500 mg twice daily.  We personally reviewed MRI of the brain in 2013, large infarction within the right basal ganglion, insular, right frontoparietal region,  UPDATE Oct 14 2019: She had no recurrent seizure, responded very well to previous, electrical stimulation guided Botox injection for spastic left upper extremity, she can move her shoulder, open up  her finger much better  UPDATE Sept 15 2021: She responded well to previous injection, no significant side effect noted, Botox injection has helped relax her left shoulder, hands,  arms  She also complains of frequent migraine headaches 3-4 times each month, mostly centered at the right side, usually not responding to over-the-counter Tylenol or ibuprofen  UPDATE August 2022: Return for electrical stimulation guided Botox injection for spastic left upper extremity, she does not want to receive injection to left lower extremity,  She complains worsening migraine headaches, despite aimovig 70 mg monthly, Ubrelvy as needed was helpful, she has at least 2-3 migraine each week, also complains of neck and shoulder muscle tenderness  She has no recurrent seizure, taking Keppra 750 mg twice a day  She also complains of occasional snoring, excessive daytime fatigue, sleepiness, her headache mostly in the morning, as day goes by, headache usually improved,  PHYSICAL EXAM   Vitals:   04/26/21 1507  BP: (!) 152/89  Pulse: 81  Weight: 201 lb (91.2 kg)  Height: '5\' 1"'$  (1.549 m)   Body mass index is 37.98 kg/m.  CN: Awake, alert, oriented to history taking and casual conversation, endenture, facial symmetric, tongue midline, narrow oropharyngeal space Motor: Spastic left hemiparesis, left hand thumb in position, proximal and distal strength is 1 out of 5, left lower extremity 1 out of 5, tightness of left shoulder, limited range of motion with passive stretch, fairly normal range of motion of left elbow, Left knee extension, ankle plantarflexion, she has noticeable left shoulder pain with passive movement, Reflexes: Hyperreflexia of left upper and lower extremity   Gait: She needs push-up to get up from seated position, spastic left hemiparesis, dragging her left leg, wearing left AFO,   REVIEW OF SYSTEMS: Full 14 system review of systems performed and notable only for: as above. All rest review of systems were negative.  ALLERGIES: Allergies  Allergen Reactions   Celebrex [Celecoxib] Swelling and Other (See Comments)    "lips; it was terrible"   Baclofen Swelling    Aspirin    Zolpidem     HOME MEDICATIONS: Current Outpatient Medications on File Prior to Visit  Medication Sig Dispense Refill   acetaminophen (TYLENOL) 325 MG tablet Take 650 mg by mouth every 6 (six) hours as needed for mild pain or headache.     Alcohol Swabs (ALCOHOL PREP) 70 % PADS      amitriptyline (ELAVIL) 100 MG tablet Take 100 mg by mouth at bedtime.   0   AQUALANCE LANCETS 30G MISC      aspirin 81 MG chewable tablet Chew 1 tablet (81 mg total) by mouth daily. 30 tablet 3   atorvastatin (LIPITOR) 80 MG tablet Take 80 mg by mouth daily.     Azelastine HCl 0.15 % SOLN 1-2 sprays in each nostril     benzonatate (TESSALON) 100 MG capsule Take 1 capsule (100 mg total) by mouth 3 (three) times daily as needed for cough. 20 capsule 0   Blood Glucose Calibration (ONETOUCH VERIO) SOLN      botulinum toxin Type A (BOTOX) 100 units SOLR injection Inject 500 Units into the muscle every 3 (three) months. 5 each 3   butalbital-acetaminophen-caffeine (FIORICET) 50-325-40 MG tablet Take 1 tablet by mouth every 6 (six) hours as needed for headache. 10 tablet 5   Cholecalciferol (VITAMIN D) 1000 UNITS capsule Take 1,000 Units by mouth daily.     desoximetasone (TOPICORT) [id number] % cream 1 application Twice a day Externally  dexamethasone (DECADRON) 2 MG tablet Take 3 tablets the first day, 2 the second and 1 the third day 6 tablet 0   DULoxetine (CYMBALTA) 60 MG capsule Take 60 mg by mouth daily.     Erenumab-aooe (AIMOVIG) 70 MG/ML SOAJ Inject 70 mg into the skin every 30 (thirty) days. 3 mL 4   esomeprazole (NEXIUM) 40 MG capsule Take 40 mg by mouth 2 (two) times daily.     glimepiride (AMARYL) 2 MG tablet Take 2 mg by mouth every morning.     glimepiride (AMARYL) 4 MG tablet Take 4 mg by mouth every morning.     guaiFENesin-dextromethorphan (ROBITUSSIN DM) 100-10 MG/5ML syrup Take 5 mLs by mouth every 4 (four) hours as needed for cough. 118 mL 0   ketoconazole (NIZORAL) 2 % cream Apply 1  application topically 2 (two) times daily as needed for irritation.     levETIRAcetam (KEPPRA) 750 MG tablet Take 1 tablet (750 mg total) by mouth 2 (two) times daily. 180 tablet 4   lidocaine (XYLOCAINE) 5 % ointment Apply 1 application topically 4 (four) times daily as needed for moderate pain.      meloxicam (MOBIC) 15 MG tablet Take 15 mg by mouth daily.     metoprolol succinate (TOPROL-XL) 25 MG 24 hr tablet Take 1 tablet (25 mg total) by mouth daily. 30 tablet 3   ondansetron (ZOFRAN) 4 MG tablet TAKE 1 TABLET BY MOUTH EVERY 8 HOURS AS NEEDED FOR NAUSEA 20 tablet 5   ONETOUCH VERIO test strip      oxyCODONE (ROXICODONE) 15 MG immediate release tablet Take 15 mg by mouth 4 (four) times daily as needed for pain.      pregabalin (LYRICA) 75 MG capsule Take 1 capsule (75 mg total) by mouth 2 (two) times daily. TAKE 1 CAPSULE BY MOUTH THREE TIMES DAILY 60 capsule 1   tiZANidine (ZANAFLEX) 2 MG tablet Take 2 tablets (4 mg total) by mouth in the morning and at bedtime. 360 tablet 4   Ubrogepant (UBRELVY) 50 MG TABS Take 50 mg by mouth as needed. 12 tablet 6   No current facility-administered medications on file prior to visit.    PAST MEDICAL HISTORY: Past Medical History:  Diagnosis Date   Chronic pain    "in this left arm and leg that don't move"   CVA (cerebrovascular accident) ([place]) 1995   "left side paralyzed"   Dental caries    Depression    Diabetes mellitus without complication ([place])    GERD (gastroesophageal reflux disease)    Headache    Hypercholesteremia    Hypertension    Insomnia    Neuropathy    Overactive bladder    Peripheral vascular disease ([place])    left arm and leg; since stroke 1995   Pneumonia    Seizures ([place]) 01/27/12   "first one ever"    PAST SURGICAL HISTORY: Past Surgical History:  Procedure Laterality Date   CEREBRAL ANEURYSM REPAIR  11/1993   "left my left side paralyzed"   DENTAL RESTORATION/EXTRACTION WITH X-RAY N/A 09/13/2017   Procedure:  DENTAL RESTORATION/EXTRACTION;  Surgeon: Diona Browner, DDS;  Location: [facility];  Service: Oral Surgery;  Laterality: N/A;   ESOPHAGOGASTRODUODENOSCOPY N/A 03/28/2020   Procedure: ESOPHAGOGASTRODUODENOSCOPY (EGD);  Surgeon: Clarene Essex, MD;  Location: [facility];  Service: Endoscopy;  Laterality: N/A;  bedside endoscopy   TUBAL LIGATION  04/1984    FAMILY HISTORY: Family History  Problem Relation Age of Onset   Dementia Mother  Diabetes type II Mother    Diabetes Mother    Breast cancer Mother    Heart attack Father     SOCIAL HISTORY:  Social History   Socioeconomic History   Marital status: Divorced    Spouse name: Not on file   Number of children: 2   Years of education: 12   Highest education level: High school graduate  Occupational History   Occupation: Disabled  Tobacco Use   Smoking status: Never   Smokeless tobacco: Never  Vaping Use   Vaping Use: Never used  Substance and Sexual Activity   Alcohol use: No   Drug use: No   Sexual activity: Yes  Other Topics Concern   Not on file  Social History Narrative   Lives at home with her grandson.   Right-handed.   No daily caffeine use.   Social Determinants of Health   Financial Resource Strain: Not on file  Food Insecurity: Not on file  Transportation Needs: Not on file  Physical Activity: Not on file  Stress: Not on file  Social Connections: Not on file  Intimate Partner Violence: Not on file      Marcial Pacas, M.D. Ph.D.  [facility] [address], [place], [place] [address] [phone]

## 2021-04-29 ENCOUNTER — Other Ambulatory Visit: Payer: Self-pay | Admitting: Neurology

## 2021-05-04 ENCOUNTER — Ambulatory Visit: Payer: 59

## 2021-06-12 ENCOUNTER — Ambulatory Visit: Payer: 59

## 2021-06-21 ENCOUNTER — Other Ambulatory Visit: Payer: Self-pay | Admitting: Neurology

## 2021-07-11 ENCOUNTER — Ambulatory Visit
Admission: RE | Admit: 2021-07-11 | Discharge: 2021-07-11 | Disposition: A | Discharge status: Home / Self Care | Payer: 59 | Source: Ambulatory Visit | Attending: Family Medicine | Admitting: Family Medicine

## 2021-07-11 ENCOUNTER — Other Ambulatory Visit: Payer: Self-pay

## 2021-07-11 DIAGNOSIS — Z1231 Encounter for screening mammogram for malignant neoplasm of breast: Secondary | ICD-10-CM

## 2021-07-12 ENCOUNTER — Encounter: Payer: Self-pay | Admitting: Neurology

## 2021-07-12 ENCOUNTER — Institutional Professional Consult (permissible substitution): Payer: 59 | Admitting: Neurology

## 2021-07-25 ENCOUNTER — Telehealth: Payer: Self-pay | Admitting: Neurology

## 2021-07-25 NOTE — Telephone Encounter (Signed)
Patient is scheduled for a Botox injection tomorrow. Patient's primary insurance is [employer] [place] [employer], which now requires PA for Botox. I obtained PA via [facility] portal. 600 units every 12 weeks for I69.354 was approved. PA #[id number] (07/25/21- 07/25/22).

## 2021-07-26 ENCOUNTER — Ambulatory Visit: Payer: 59 | Admitting: Neurology

## 2021-08-10 ENCOUNTER — Telehealth: Payer: Self-pay | Admitting: *Deleted

## 2021-08-10 NOTE — Telephone Encounter (Signed)
PA for Ubrelvy 50mg  started on covermymeds (key: [id number]). Pt has coverage through [employer] (RJ#[id number]). Approved through 08/25/2022.

## 2021-08-18 ENCOUNTER — Telehealth: Payer: Self-pay | Admitting: Neurology

## 2021-08-18 MED ORDER — AIMOVIG 70 MG/ML ~~LOC~~ SOAJ
70.0000 mg | SUBCUTANEOUS | 1 refills | Status: DC
Start: 2021-08-18 — End: 2022-02-08

## 2021-08-18 NOTE — Telephone Encounter (Signed)
Refills sent to pharmacy. 

## 2021-08-18 NOTE — Telephone Encounter (Signed)
Pt is needing a refill request for her Erenumab-aooe (AIMOVIG) 70 MG/ML SOAJ sent to the [facility] on [address].

## 2021-08-29 ENCOUNTER — Telehealth: Payer: Self-pay | Admitting: Neurology

## 2021-08-29 MED ORDER — TIZANIDINE HCL 2 MG PO TABS: 4.0000 mg | ORAL_TABLET | Freq: Two times a day (BID) | ORAL | 1 refills | Status: DC

## 2021-08-29 NOTE — Telephone Encounter (Signed)
Refills sent to pharmacy. 

## 2021-08-29 NOTE — Telephone Encounter (Signed)
Pt requesting a refill for tiZANidine (ZANAFLEX) 2 MG tablet. Pharmacy [facility] [place], [address] AT [address]

## 2021-09-11 ENCOUNTER — Telehealth: Payer: Self-pay

## 2021-09-11 ENCOUNTER — Telehealth: Payer: Self-pay | Admitting: Neurology

## 2021-09-11 NOTE — Telephone Encounter (Signed)
Pt requesting refill for tiZANidine (ZANAFLEX) 2 MG tablet. Pharmacy [facility] [place], [address] AT [address]

## 2021-09-11 NOTE — Telephone Encounter (Signed)
90-day supply w/ 1 additional refill sent to pharmacy on 08/29/21.   I tried to call the patient but her voicemail has not been set up. Unable to leave message. Please provide this information to her, if she calls back. She just needs to let the pharmacy know she is ready to pick up the medication.

## 2021-09-11 NOTE — Telephone Encounter (Signed)
Received a PA request for aimovig from [facility]. Attempted PA. Received this response from [employer]: "This medication or product was previously approved on [id number] from 2021-09-03 to 2022-09-02. **Please note: This request was submitted electronically. Formulary lowering, tiering exception, cost reduction and/or pre-benefit determination review (including prospective Medicare hospice reviews) requests cannot be requested using this method of submission. Providers contact us at [phone] for further assistance."  Key: [id number]

## 2021-09-25 DIAGNOSIS — M792 Neuralgia and neuritis, unspecified: Secondary | ICD-10-CM | POA: Diagnosis not present

## 2021-09-25 DIAGNOSIS — R5383 Other fatigue: Secondary | ICD-10-CM | POA: Diagnosis not present

## 2021-09-25 DIAGNOSIS — I69398 Other sequelae of cerebral infarction: Secondary | ICD-10-CM | POA: Diagnosis not present

## 2021-09-25 DIAGNOSIS — M129 Arthropathy, unspecified: Secondary | ICD-10-CM | POA: Diagnosis not present

## 2021-09-25 DIAGNOSIS — R030 Elevated blood-pressure reading, without diagnosis of hypertension: Secondary | ICD-10-CM | POA: Diagnosis not present

## 2021-09-25 DIAGNOSIS — Z79899 Other long term (current) drug therapy: Secondary | ICD-10-CM | POA: Diagnosis not present

## 2021-09-25 DIAGNOSIS — Z0000 Encounter for general adult medical examination without abnormal findings: Secondary | ICD-10-CM | POA: Diagnosis not present

## 2021-09-27 DIAGNOSIS — Z79899 Other long term (current) drug therapy: Secondary | ICD-10-CM | POA: Diagnosis not present

## 2021-10-26 DIAGNOSIS — E559 Vitamin D deficiency, unspecified: Secondary | ICD-10-CM | POA: Diagnosis not present

## 2021-10-26 DIAGNOSIS — Z79899 Other long term (current) drug therapy: Secondary | ICD-10-CM | POA: Diagnosis not present

## 2021-10-26 DIAGNOSIS — M129 Arthropathy, unspecified: Secondary | ICD-10-CM | POA: Diagnosis not present

## 2021-10-26 DIAGNOSIS — M545 Low back pain, unspecified: Secondary | ICD-10-CM | POA: Diagnosis not present

## 2021-10-26 DIAGNOSIS — R5383 Other fatigue: Secondary | ICD-10-CM | POA: Diagnosis not present

## 2021-10-30 DIAGNOSIS — Z79899 Other long term (current) drug therapy: Secondary | ICD-10-CM | POA: Diagnosis not present

## 2021-11-01 ENCOUNTER — Ambulatory Visit: Payer: 59 | Admitting: Neurology

## 2021-11-11 ENCOUNTER — Other Ambulatory Visit: Payer: Self-pay | Admitting: Neurology

## 2021-11-22 DIAGNOSIS — Z79899 Other long term (current) drug therapy: Secondary | ICD-10-CM | POA: Diagnosis not present

## 2021-11-22 DIAGNOSIS — M545 Low back pain, unspecified: Secondary | ICD-10-CM | POA: Diagnosis not present

## 2021-11-27 DIAGNOSIS — Z79899 Other long term (current) drug therapy: Secondary | ICD-10-CM | POA: Diagnosis not present

## 2021-12-22 DIAGNOSIS — R030 Elevated blood-pressure reading, without diagnosis of hypertension: Secondary | ICD-10-CM | POA: Diagnosis not present

## 2021-12-22 DIAGNOSIS — M545 Low back pain, unspecified: Secondary | ICD-10-CM | POA: Diagnosis not present

## 2021-12-22 DIAGNOSIS — Z79899 Other long term (current) drug therapy: Secondary | ICD-10-CM | POA: Diagnosis not present

## 2021-12-23 ENCOUNTER — Other Ambulatory Visit: Payer: Self-pay | Admitting: Neurology

## 2021-12-25 DIAGNOSIS — Z79899 Other long term (current) drug therapy: Secondary | ICD-10-CM | POA: Diagnosis not present

## 2021-12-28 ENCOUNTER — Telehealth: Payer: Self-pay | Admitting: Neurology

## 2021-12-28 NOTE — Telephone Encounter (Signed)
I called pt back she sts she wanted to know if she could take an additional ubrelvy due to lingering h/a. Reports she took 1 at 900 am this morning. I advised it was safe to take an additional tablet now but no more than 2 in a 24 hour period.  ? ?She verbalized understanding and appreciation for the call. She will keep [place] up to date on how she is feeling.  ?

## 2021-12-28 NOTE — Telephone Encounter (Signed)
Pt would like to know if she can take more than 1 of her Ubrogepant (UBRELVY) 50 MG TABS for a headache, please call. ?

## 2022-01-22 ENCOUNTER — Telehealth: Payer: Self-pay | Admitting: Neurology

## 2022-01-22 DIAGNOSIS — R030 Elevated blood-pressure reading, without diagnosis of hypertension: Secondary | ICD-10-CM | POA: Diagnosis not present

## 2022-01-22 DIAGNOSIS — M545 Low back pain, unspecified: Secondary | ICD-10-CM | POA: Diagnosis not present

## 2022-01-22 DIAGNOSIS — G4739 Other sleep apnea: Secondary | ICD-10-CM | POA: Diagnosis not present

## 2022-01-22 DIAGNOSIS — Z79899 Other long term (current) drug therapy: Secondary | ICD-10-CM | POA: Diagnosis not present

## 2022-01-22 NOTE — Telephone Encounter (Signed)
Pt has called to inform Dr Krista Blue that the [facility] 59 MG/ML SOAJ, is no longer working and she is taking 2 Ubrelvy a day

## 2022-01-23 NOTE — Telephone Encounter (Signed)
I spoke to the patient. Feels Aimovig is no longer working. She has been waking up every morning with some type of headache. I inquired about her Ubrelvy '50mg'$  usage. She recently received a 90-day supply (#36 tablets). She has been taking two tablets daily for the last two weeks. I discussed this being a rescue medication only. She should use at the onset of an acute migraine. No more than two tabs in one day and no more than twelve tabs in one month. I told her she likely has a medication overuse headache. The best approach is to withdraw from [place] and hopefully the daily headache will resolve. She may have a few days that the pain is worse before it gets better. [name] should not be taken for daily headaches but for migraines. She is agreeable to try this plan. She should also make sure to stay well hydrated. She also uses oxycodone '15mg'$ , 4-5 tabs daily, but this is prescribed for her chronic pain. She has not been in for an office visit for a while. Scheduled her an appt, to discuss her migraine medications, on 01/31/22.

## 2022-01-31 ENCOUNTER — Ambulatory Visit: Payer: Medicare Other | Admitting: Neurology

## 2022-01-31 ENCOUNTER — Other Ambulatory Visit: Payer: Self-pay | Admitting: Neurology

## 2022-02-02 ENCOUNTER — Other Ambulatory Visit: Payer: Self-pay | Admitting: Neurology

## 2022-02-08 ENCOUNTER — Encounter: Payer: Self-pay | Admitting: Neurology

## 2022-02-08 ENCOUNTER — Ambulatory Visit (INDEPENDENT_AMBULATORY_CARE_PROVIDER_SITE_OTHER): Payer: Medicare Other | Admitting: Neurology

## 2022-02-08 ENCOUNTER — Telehealth: Payer: Self-pay | Admitting: Neurology

## 2022-02-08 VITALS — BP 126/77 | HR 82 | Ht 61.0 in | Wt 201.0 lb

## 2022-02-08 DIAGNOSIS — G43709 Chronic migraine without aura, not intractable, without status migrainosus: Secondary | ICD-10-CM | POA: Diagnosis not present

## 2022-02-08 DIAGNOSIS — I69354 Hemiplegia and hemiparesis following cerebral infarction affecting left non-dominant side: Secondary | ICD-10-CM

## 2022-02-08 DIAGNOSIS — G40909 Epilepsy, unspecified, not intractable, without status epilepticus: Secondary | ICD-10-CM | POA: Diagnosis not present

## 2022-02-08 MED ORDER — QULIPTA 60 MG PO TABS: 60.0000 mg | ORAL_TABLET | Freq: Every day | ORAL | 11 refills | Status: DC

## 2022-02-08 MED ORDER — LEVETIRACETAM 750 MG PO TABS: 750.0000 mg | ORAL_TABLET | Freq: Two times a day (BID) | ORAL | 3 refills | Status: DC

## 2022-02-08 MED ORDER — ALPRAZOLAM 1 MG PO TABS: ORAL_TABLET | ORAL | 0 refills | Status: AC

## 2022-02-08 MED ORDER — TIZANIDINE HCL 2 MG PO TABS
4.0000 mg | ORAL_TABLET | Freq: Two times a day (BID) | ORAL | 3 refills | Status: DC
Start: 2022-02-08 — End: 2022-04-24

## 2022-02-08 NOTE — Patient Instructions (Signed)
Meds ordered this encounter  Medications   Atogepant (QULIPTA) 60 MG TABS    Sig: Take 60 mg by mouth daily.    Dispense:  30 tablet    Refill:  11   levETIRAcetam (KEPPRA) 750 MG tablet    Sig: Take 1 tablet (750 mg total) by mouth 2 (two) times daily.    Dispense:  180 tablet    Refill:  3   tiZANidine (ZANAFLEX) 2 MG tablet    Sig: Take 2 tablets (4 mg total) by mouth in the morning and at bedtime.    Dispense:  360 tablet    Refill:  3     Stop Aimovig Stop Ubrevyl  Lab today

## 2022-02-08 NOTE — Progress Notes (Unsigned)
ASSESSMENT AND PLAN  Jordan Rivas is a 61 y.o. female Spastic right hemisphere following hemorrhagic stroke, at age 50, in 2003 CT head showed spastic left hemiparesis due to right basal ganglia and right frontal parietal region infarction She wants to continue all botulism toxin injection for spastic left hemiparesis  Long history of chronic migraine  Worsening headaches since beginning of 2023, worsening functional status, desiring further evaluation  ESR C-reactive protein  Repeat MRI of the brain  Respond to [employer], but aimovig was not adequate to control her daily headaches, will start Qulipta 60 mg daily Complex partial seizure  Doing well on Keppra 750 mg twice daily   HISTORICAL  Jordan Rivas is on 61 years old African American female, accompanied by her daughter for evaluation of seizure, left spastic hemiparesis  She had a history of right hemisphere hemorrhagic stroke due to brain aneurysm per patient at age 25, was residual severe left spastic hemiparesis, left visual field cut, profound gait difficulty,  She presented with her only seizure on Jan 26 2013, she woke up in the middle of the night using bathroom, while walking towards the bathroom, she fell, with witnessed seizure-like activity, tongue biting, woke up confused, at the hospital  MRI of the brain demonstrate extensive encephalomalacia involving right basal ganglion, perisylvian fissure region, no acute lesions, she has been treated with Keppra 500 mg twice a day, she has no recurrent seizure,  She continues to complain deep body aching pain at the left side, involving her left upper and lower extremity, specificity, difficulty straightening up her left finger, left leg spasm, she reported improvement of her left lower extremity spasm after receiving Botox injection in the past, but it was many years ago,  She also complains of couple years history of frequent headaches, getting worse almost daily basis, she  has been taking frequent Excedrin migraines, left retro-orbital area severe pounding headache was associated light, noise sensitivity,  She has been receiving EMG guided botulinum toxin injection for her spastic left hemiparesis every 3 months since July 2015.  She is referred back by her primary care today for evaluation of possible recurrent seizure, October 17, 18, she woke up, her body was 90 degree from her usual sleeping position, 1 night she fell out of the bed do not know why, she denies tongue biting, no incontinence, she has been compliant with her Keppra 500 mg twice daily.  We personally reviewed MRI of the brain in 2013, large infarction within the right basal ganglion, insular, right frontoparietal region,  UPDATE Oct 14 2019: She had no recurrent seizure, responded very well to previous, electrical stimulation guided Botox injection for spastic left upper extremity, she can move her shoulder, open up her finger much better  UPDATE Sept 15 2021: She responded well to previous injection, no significant side effect noted, Botox injection has helped relax her left shoulder, hands, arms  She also complains of frequent migraine headaches 3-4 times each month, mostly centered at the right side, usually not responding to over-the-counter Tylenol or ibuprofen  UPDATE August 2022: Return for electrical stimulation guided Botox injection for spastic left upper extremity, she does not want to receive injection to left lower extremity,  She complains worsening migraine headaches, despite aimovig 70 mg monthly, Ubrelvy as needed was helpful, she has at least 2-3 migraine each week, also complains of neck and shoulder muscle tenderness  She has no recurrent seizure, taking Keppra 750 mg twice a day  She also  complains of occasional snoring, excessive daytime fatigue, sleepiness, her headache mostly in the morning, as day goes by, headache usually improved,  UPDATE June 8th 2023: She has  increaed headsaches, right  occitpail pariental ergion, pressure, thorbbing, light, nuase, nauese, lasting for 1 hours,    PHYSICAL EXAM   Vitals:   04/26/21 1507  BP: (!) 152/89  Pulse: 81  Weight: 201 lb (91.2 kg)  Height: _0  (1.549 m)   Body mass index is 37.98 kg/m.  CN: Awake, alert, oriented to history taking and casual conversation, endenture, left lower face weakness, tongue midline, narrow oropharyngeal space, left hemianopia Motor: Spastic left hemiparesis, left hand thumb in position, proximal and distal strength is 1 out of 5, left lower extremity 1 out of 5, tightness of left shoulder, limited range of motion with passive stretch, fairly normal range of motion of left elbow, Left knee extension, ankle plantarflexion, wearing left AFO Reflexes: Hyperreflexia of left upper and lower extremity Cerebellum: No truncal ataxia or limb dysmetria Sensory: Left hemisensory extinction Gait: She needs push-up to get up from seated position, spastic left hemiparesis, dragging her left leg, wearing left AFO,   REVIEW OF SYSTEMS: Full 14 system review of systems performed and notable only for: as above. All rest review of systems were negative.  ALLERGIES: Allergies  Allergen Reactions   Celebrex [Celecoxib] Swelling and Other (See Comments)    "lips; it was terrible"   Baclofen Swelling   Aspirin    Zolpidem     HOME MEDICATIONS: Current Outpatient Medications on File Prior to Visit  Medication Sig Dispense Refill   acetaminophen (TYLENOL) 325 MG tablet Take 650 mg by mouth every 6 (six) hours as needed for mild pain or headache.     Alcohol Swabs (ALCOHOL PREP) 70 % PADS      amitriptyline (ELAVIL) 100 MG tablet Take 100 mg by mouth at bedtime.   0   AQUALANCE LANCETS 30G MISC      aspirin 81 MG chewable tablet Chew 1 tablet (81 mg total) by mouth daily. 30 tablet 3   atorvastatin (LIPITOR) 80 MG tablet Take 80 mg by mouth daily.     Azelastine HCl 0.15 % SOLN 1-2  sprays in each nostril     benzonatate (TESSALON) 100 MG capsule Take 1 capsule (100 mg total) by mouth 3 (three) times daily as needed for cough. 20 capsule 0   Blood Glucose Calibration (ONETOUCH VERIO) SOLN      botulinum toxin Type A (BOTOX) 100 units SOLR injection Inject 500 Units into the muscle every 3 (three) months. 5 each 3   butalbital-acetaminophen-caffeine (FIORICET) 50-325-40 MG tablet Take 1 tablet by mouth every 6 (six) hours as needed for headache. 10 tablet 5   Cholecalciferol (VITAMIN D) 1000 UNITS capsule Take 1,000 Units by mouth daily.     desoximetasone (TOPICORT) 1.61 % cream 1 application Twice a day Externally     dexamethasone (DECADRON) 2 MG tablet Take 3 tablets the first day, 2 the second and 1 the third day 6 tablet 0   DULoxetine (CYMBALTA) 60 MG capsule Take 60 mg by mouth daily.     Erenumab-aooe (AIMOVIG) 70 MG/ML SOAJ Inject 70 mg into the skin every 30 (thirty) days. 3 mL 1   esomeprazole (NEXIUM) 40 MG capsule Take 40 mg by mouth 2 (two) times daily.     glimepiride (AMARYL) 2 MG tablet Take 2 mg by mouth every morning.     glimepiride (AMARYL) 4  MG tablet Take 4 mg by mouth every morning.     guaiFENesin-dextromethorphan (ROBITUSSIN DM) 100-10 MG/5ML syrup Take 5 mLs by mouth every 4 (four) hours as needed for cough. 118 mL 0   ketoconazole (NIZORAL) 2 % cream Apply 1 application topically 2 (two) times daily as needed for irritation.     levETIRAcetam (KEPPRA) 750 MG tablet TAKE 1 TABLET(750 MG) BY MOUTH TWICE DAILY 180 tablet 0   lidocaine (XYLOCAINE) 5 % ointment Apply 1 application topically 4 (four) times daily as needed for moderate pain.      meloxicam (MOBIC) 15 MG tablet Take 15 mg by mouth daily.     metoprolol succinate (TOPROL-XL) 25 MG 24 hr tablet Take 1 tablet (25 mg total) by mouth daily. 30 tablet 3   ondansetron (ZOFRAN) 4 MG tablet TAKE 1 TABLET BY MOUTH EVERY 8 HOURS AS NEEDED FOR NAUSEA 20 tablet 5   ONETOUCH VERIO test strip       oxyCODONE (ROXICODONE) 15 MG immediate release tablet Take 15 mg by mouth 4 (four) times daily as needed for pain.      pregabalin (LYRICA) 75 MG capsule Take 1 capsule (75 mg total) by mouth 2 (two) times daily. TAKE 1 CAPSULE BY MOUTH THREE TIMES DAILY 60 capsule 1   tiZANidine (ZANAFLEX) 2 MG tablet Take 2 tablets (4 mg total) by mouth in the morning and at bedtime. 360 tablet 1   Ubrogepant (UBRELVY) 50 MG TABS Take 50 mg by mouth as needed. 12 tablet 6   No current facility-administered medications on file prior to visit.    PAST MEDICAL HISTORY: Past Medical History:  Diagnosis Date   Chronic pain    "in this left arm and leg that don't move"   CVA (cerebrovascular accident) ([place]) 1995   "left side paralyzed"   Dental caries    Depression    Diabetes mellitus without complication ([place])    GERD (gastroesophageal reflux disease)    Headache    Hypercholesteremia    Hypertension    Insomnia    Neuropathy    Overactive bladder    Peripheral vascular disease ([place])    left arm and leg; since stroke 1995   Pneumonia    Seizures ([place]) 01/27/12   "first one ever"    PAST SURGICAL HISTORY: Past Surgical History:  Procedure Laterality Date   CEREBRAL ANEURYSM REPAIR  11/1993   "left my left side paralyzed"   DENTAL RESTORATION/EXTRACTION WITH X-RAY N/A 09/13/2017   Procedure: DENTAL RESTORATION/EXTRACTION;  Surgeon: Diona Browner, DDS;  Location: [facility];  Service: Oral Surgery;  Laterality: N/A;   ESOPHAGOGASTRODUODENOSCOPY N/A 03/28/2020   Procedure: ESOPHAGOGASTRODUODENOSCOPY (EGD);  Surgeon: Clarene Essex, MD;  Location: [facility];  Service: Endoscopy;  Laterality: N/A;  bedside endoscopy   TUBAL LIGATION  04/1984    FAMILY HISTORY: Family History  Problem Relation Age of Onset   Dementia Mother    Diabetes type II Mother    Diabetes Mother    Breast cancer Mother    Heart attack Father     SOCIAL HISTORY:  Social History   Socioeconomic History   Marital status:  Divorced    Spouse name: Not on file   Number of children: 2   Years of education: 12   Highest education level: High school graduate  Occupational History   Occupation: Disabled  Tobacco Use   Smoking status: Never   Smokeless tobacco: Never  Vaping Use   Vaping Use: Never used  Substance and Sexual Activity   Alcohol use: No   Drug use: No   Sexual activity: Yes  Other Topics Concern   Not on file  Social History Narrative   Lives at home with her grandson.   Right-handed.   No daily caffeine use.   Social Determinants of Health   Financial Resource Strain: Not on file  Food Insecurity: Not on file  Transportation Needs: Not on file  Physical Activity: Not on file  Stress: Not on file  Social Connections: Not on file  Intimate Partner Violence: Not on file      Marcial Pacas, M.D. Ph.D.  [facility] [address], [place], [place] [address] [phone]

## 2022-02-08 NOTE — Telephone Encounter (Signed)
Please call patient to clarify her billing issues, she wants to continue Botox injections our office

## 2022-02-09 ENCOUNTER — Telehealth: Payer: Self-pay | Admitting: Neurology

## 2022-02-09 ENCOUNTER — Telehealth: Payer: Self-pay | Admitting: *Deleted

## 2022-02-09 LAB — CBC WITH DIFFERENTIAL/PLATELET
Basophils Absolute: 0 10*3/uL (ref 0.0–0.2)
Basos: 0 %
EOS (ABSOLUTE): 0.2 10*3/uL (ref 0.0–0.4)
Eos: 2 %
Hematocrit: 37.8 % (ref 34.0–46.6)
Hemoglobin: 12 g/dL (ref 11.1–15.9)
Immature Grans (Abs): 0 10*3/uL (ref 0.0–0.1)
Immature Granulocytes: 0 %
Lymphocytes Absolute: 1.9 10*3/uL (ref 0.7–3.1)
Lymphs: 21 %
MCH: 24.8 pg — ABNORMAL LOW (ref 26.6–33.0)
MCHC: 31.7 g/dL (ref 31.5–35.7)
MCV: 78 fL — ABNORMAL LOW (ref 79–97)
Monocytes Absolute: 0.4 10*3/uL (ref 0.1–0.9)
Monocytes: 4 %
Neutrophils Absolute: 6.4 10*3/uL (ref 1.4–7.0)
Neutrophils: 73 %
Platelets: 278 10*3/uL (ref 150–450)
RBC: 4.83 x10E6/uL (ref 3.77–5.28)
RDW: 15.2 % (ref 11.7–15.4)
WBC: 8.9 10*3/uL (ref 3.4–10.8)

## 2022-02-09 LAB — COMPREHENSIVE METABOLIC PANEL
ALT: 20 IU/L (ref 0–32)
AST: 22 IU/L (ref 0–40)
Albumin/Globulin Ratio: 1.3 (ref 1.2–2.2)
Albumin: 3.9 g/dL (ref 3.8–4.8)
Alkaline Phosphatase: 155 IU/L — ABNORMAL HIGH (ref 44–121)
BUN/Creatinine Ratio: 14 (ref 12–28)
BUN: 10 mg/dL (ref 8–27)
Bilirubin Total: <0.2 mg/dL (ref 0.0–1.2)
CO2: 26 mmol/L (ref 20–29)
Calcium: 8.9 mg/dL (ref 8.7–10.3)
Chloride: 102 mmol/L (ref 96–106)
Creatinine, Ser: 0.73 mg/dL (ref 0.57–1.00)
Globulin, Total: 3 g/dL (ref 1.5–4.5)
Glucose: 173 mg/dL — ABNORMAL HIGH (ref 70–99)
Potassium: 4 mmol/L (ref 3.5–5.2)
Sodium: 140 mmol/L (ref 134–144)
Total Protein: 6.9 g/dL (ref 6.0–8.5)
eGFR: 92 mL/min/{1.73_m2} (ref >59)

## 2022-02-09 LAB — SEDIMENTATION RATE: Sed Rate: 41 mm/hr — ABNORMAL HIGH (ref 0–40)

## 2022-02-09 LAB — TSH: TSH: 2.63 u[IU]/mL (ref 0.450–4.500)

## 2022-02-09 LAB — C-REACTIVE PROTEIN: CRP: 12 mg/L — ABNORMAL HIGH (ref 0–10)

## 2022-02-09 NOTE — Telephone Encounter (Signed)
[employer] medicare/medicaid NPR sent to [facility] they will call the patient to schedule

## 2022-02-09 NOTE — Telephone Encounter (Signed)
PA for Qulipta '60mg'$  started on covermymeds (key: [id number]). Pt has coverage through [employer] (VN#[id number]). Decision pending.

## 2022-02-12 NOTE — Telephone Encounter (Signed)
[id number] approved through 09/02/2022.

## 2022-02-13 ENCOUNTER — Telehealth: Payer: Self-pay | Admitting: Neurology

## 2022-02-13 NOTE — Telephone Encounter (Signed)
Please call patient laboratory evaluation showed slightly elevated alkaline phosphate  Slight elevation of ESR, C-reactive protein of unknown clinical significance  Normal TSH.  Please continue follow-up with primary care physician

## 2022-02-13 NOTE — Telephone Encounter (Signed)
I spoke to the patient. Provided her lab results. She will keep her pending appt with her PCP.

## 2022-02-13 NOTE — Telephone Encounter (Signed)
I called patient to discuss. Phone keeps ringing, no VM picks up. If patient calls back please route to POD 2.

## 2022-02-14 ENCOUNTER — Telehealth: Payer: Self-pay | Admitting: Neurology

## 2022-02-14 MED ORDER — LEVETIRACETAM 750 MG PO TABS
750.0000 mg | ORAL_TABLET | Freq: Two times a day (BID) | ORAL | 3 refills | Status: DC
Start: 2022-02-14 — End: 2023-03-18

## 2022-02-14 NOTE — Telephone Encounter (Signed)
Pt lost her medication and is unable to fill levETIRAcetam (KEPPRA) 750 MG BID. Pharmacy states she pick up a 90 day supply in may, ins will not pay until July. She is out of medication and her last dose was Sunday 02/11/22. Pharmacy states we may be able to get medication approved by ins if we send different doses. Can we send a NEW Rx for keppra 500 mg and 250 mg?

## 2022-02-14 NOTE — Telephone Encounter (Signed)
Pt called needing a refill request for her levETIRAcetam (KEPPRA) 750 MG tablet sent in to the [facility] on [address].

## 2022-02-14 NOTE — Telephone Encounter (Signed)
Pt called back to notify she has found her seizure medication.

## 2022-02-20 DIAGNOSIS — Z79899 Other long term (current) drug therapy: Secondary | ICD-10-CM | POA: Diagnosis not present

## 2022-02-20 DIAGNOSIS — E109 Type 1 diabetes mellitus without complications: Secondary | ICD-10-CM | POA: Diagnosis not present

## 2022-02-20 DIAGNOSIS — M545 Low back pain, unspecified: Secondary | ICD-10-CM | POA: Diagnosis not present

## 2022-02-20 DIAGNOSIS — G4739 Other sleep apnea: Secondary | ICD-10-CM | POA: Diagnosis not present

## 2022-02-22 ENCOUNTER — Other Ambulatory Visit: Payer: Self-pay | Admitting: *Deleted

## 2022-02-22 ENCOUNTER — Telehealth: Payer: Self-pay | Admitting: Neurology

## 2022-02-22 MED ORDER — UBRELVY 50 MG PO TABS: ORAL_TABLET | ORAL | 6 refills | Status: DC

## 2022-02-22 NOTE — Telephone Encounter (Signed)
[name] refills sent to the pharmacy. Says pregabalin has been managed by her PCP and she will check with that office. She is in the process of changing her PCP.

## 2022-02-22 NOTE — Telephone Encounter (Signed)
Pt is requesting a refill for pregabalin (LYRICA) 75 MG capsule Ubrelvy(pt states she has been out of this for a few months).  Pharmacy: [facility] [phone]

## 2022-03-19 DIAGNOSIS — I69398 Other sequelae of cerebral infarction: Secondary | ICD-10-CM | POA: Diagnosis not present

## 2022-03-19 DIAGNOSIS — M25519 Pain in unspecified shoulder: Secondary | ICD-10-CM | POA: Diagnosis not present

## 2022-03-19 DIAGNOSIS — M25559 Pain in unspecified hip: Secondary | ICD-10-CM | POA: Diagnosis not present

## 2022-03-19 DIAGNOSIS — M25552 Pain in left hip: Secondary | ICD-10-CM | POA: Diagnosis not present

## 2022-03-19 DIAGNOSIS — M542 Cervicalgia: Secondary | ICD-10-CM | POA: Diagnosis not present

## 2022-03-19 DIAGNOSIS — I69359 Hemiplegia and hemiparesis following cerebral infarction affecting unspecified side: Secondary | ICD-10-CM | POA: Diagnosis not present

## 2022-03-19 DIAGNOSIS — M792 Neuralgia and neuritis, unspecified: Secondary | ICD-10-CM | POA: Diagnosis not present

## 2022-03-19 DIAGNOSIS — Z79899 Other long term (current) drug therapy: Secondary | ICD-10-CM | POA: Diagnosis not present

## 2022-03-19 DIAGNOSIS — M545 Low back pain, unspecified: Secondary | ICD-10-CM | POA: Diagnosis not present

## 2022-03-19 DIAGNOSIS — M546 Pain in thoracic spine: Secondary | ICD-10-CM | POA: Diagnosis not present

## 2022-03-19 DIAGNOSIS — M79672 Pain in left foot: Secondary | ICD-10-CM | POA: Diagnosis not present

## 2022-03-19 DIAGNOSIS — R030 Elevated blood-pressure reading, without diagnosis of hypertension: Secondary | ICD-10-CM | POA: Diagnosis not present

## 2022-03-22 DIAGNOSIS — Z79899 Other long term (current) drug therapy: Secondary | ICD-10-CM | POA: Diagnosis not present

## 2022-04-20 ENCOUNTER — Other Ambulatory Visit: Payer: Self-pay | Admitting: Neurology

## 2022-05-14 ENCOUNTER — Ambulatory Visit: Payer: Medicare Other | Admitting: Adult Health

## 2022-05-23 ENCOUNTER — Ambulatory Visit: Payer: 59 | Admitting: Family Medicine

## 2022-05-24 ENCOUNTER — Ambulatory Visit (INDEPENDENT_AMBULATORY_CARE_PROVIDER_SITE_OTHER): Payer: Medicare Other | Admitting: Podiatry

## 2022-05-24 DIAGNOSIS — M2041 Other hammer toe(s) (acquired), right foot: Secondary | ICD-10-CM

## 2022-05-24 DIAGNOSIS — B351 Tinea unguium: Secondary | ICD-10-CM | POA: Diagnosis not present

## 2022-05-24 DIAGNOSIS — M79675 Pain in left toe(s): Secondary | ICD-10-CM

## 2022-05-24 DIAGNOSIS — M2042 Other hammer toe(s) (acquired), left foot: Secondary | ICD-10-CM

## 2022-05-24 DIAGNOSIS — M79674 Pain in right toe(s): Secondary | ICD-10-CM | POA: Diagnosis not present

## 2022-05-24 DIAGNOSIS — E1149 Type 2 diabetes mellitus with other diabetic neurological complication: Secondary | ICD-10-CM | POA: Diagnosis not present

## 2022-05-24 DIAGNOSIS — M21372 Foot drop, left foot: Secondary | ICD-10-CM

## 2022-05-24 NOTE — Progress Notes (Addendum)
  Subjective:  Patient ID: Jordan Rivas, female    DOB: 1961-09-03,  MRN: [id number]  Chief Complaint  Patient presents with   Foot Problem    DFC-NAIL TRIM     61 y.o. female presents with the above complaint. History confirmed with patient.  Patient presents the office today for complaint of painful nails to both feet.  They are thickened elongated and growing abnormally.  She does have a history of diabetes type 2 with peripheral neuropathy.  Unable to trim her own nails due to the thickness.  Objective:  Physical Exam: warm, good capillary refill, nail exam onychomycosis of the toenails, onycholysis, and dystrophic nails, no trophic changes or ulcerative lesions. DP pulses palpable, PT pulses palpable, and protective sensation absent Left Foot: hammertoe deformity of the lesser digits noted  Right Foot: Hammertoe deformity of the lesser digits noted  No images are attached to the encounter.  Assessment:   1. Pain due to onychomycosis of toenails of both feet   2. Type II diabetes mellitus with neurological manifestations (HCC)   3. Hammertoes of both feet   4. Left foot drop      Plan:  Patient was evaluated and treated and all questions answered.  #Annual DM exam Patient educated on diabetes. Discussed proper diabetic foot care and discussed risks and complications of disease. Educated patient in depth on reasons to return to the office immediately should he/she discover anything concerning or new on the feet. All questions answered. Discussed proper shoes as well.  -Recommend we proceed with a new pair of diabetic shoes patient was fitted for these at this visit.  #Onychomycosis with pain  -Nails palliatively debrided as below. -Educated on self-care  Procedure: Nail Debridement Rationale: Pain Type of Debridement: manual, sharp debridement. Instrumentation: Nail nipper, rotary burr. Number of Nails: 10  Return in about 3 months (around 08/23/2022) for [facility].          Everitt Amber, DPM [facility]

## 2022-05-25 NOTE — Addendum Note (Signed)
Addended by: Ames Coupe F on: 05/25/2022 02:44 PM   Modules accepted: Level of Service

## 2022-06-04 ENCOUNTER — Ambulatory Visit: Payer: Medicare Other | Admitting: Adult Health

## 2022-06-04 ENCOUNTER — Telehealth: Payer: Self-pay | Admitting: Adult Health

## 2022-06-04 ENCOUNTER — Encounter: Payer: Self-pay | Admitting: Adult Health

## 2022-06-04 ENCOUNTER — Telehealth (INDEPENDENT_AMBULATORY_CARE_PROVIDER_SITE_OTHER): Payer: Medicare Other | Admitting: Adult Health

## 2022-06-04 ENCOUNTER — Telehealth: Payer: Medicare Other | Admitting: Adult Health

## 2022-06-04 DIAGNOSIS — I69354 Hemiplegia and hemiparesis following cerebral infarction affecting left non-dominant side: Secondary | ICD-10-CM | POA: Diagnosis not present

## 2022-06-04 DIAGNOSIS — G43709 Chronic migraine without aura, not intractable, without status migrainosus: Secondary | ICD-10-CM | POA: Diagnosis not present

## 2022-06-04 DIAGNOSIS — G40909 Epilepsy, unspecified, not intractable, without status epilepticus: Secondary | ICD-10-CM | POA: Diagnosis not present

## 2022-06-04 NOTE — Progress Notes (Signed)
[facility] [address] [place]. [place] [address] [phone]       OFFICE FOLLOW UP NOTE  Ms. Jordan Rivas Date of Birth:  Dec 24, 1960 Medical Record Number:  [id number]   Primary neurologist: Dr. Krista Blue Reason for visit: hx of stroke, seizure and migraines   Virtual Visit via Video Note  I connected with Jordan Rivas on 06/04/22 at 10:45 AM EDT by a video enabled telemedicine application and verified that I am speaking with the correct person using two identifiers.  Location: Patient: at home Provider:  in office   I discussed the limitations of evaluation and management by telemedicine and the availability of in person appointments. The patient expressed understanding and agreed to proceed.    SUBJECTIVE:    HPI:   Jordan Rivas is on 61 years old African American female, here to follow-up for spastic left hemiparesis following right basal ganglion hemorrhagic stroke at age 59 in 2003, complex partial seizure, chronic migraine headaches,   Update 02/08/2022 Dr. Krista Blue: She had a history of right hemisphere hemorrhagic stroke due to brain aneurysm at age 78, was residual severe left spastic hemiparesis, left visual field cut, profound gait difficulty,   She presented with her only seizure on Jan 26 2013, she woke up in the middle of the night using bathroom, while walking towards the bathroom, she fell, with witnessed seizure-like activity, tongue biting, woke up confused, second episode of seizure was on June 19, 2017, fell out of the bed for unknown reasons, now taking Keppra 750 twice a day,   MRI of the brain demonstrate extensive encephalomalacia involving right basal ganglion, perisylvian fissure region, no acute lesions, she has been treated with Keppra 500 mg twice a day, she has no recurrent seizure,   She has been receiving Botox injection for spastic left hemiparesis since 2015,    She had long history of chronic migraine headaches, complains  of increased headache over the past few months, previously her headache responding well to it aimovig as preventive medication, [name] as needed, but gradually lost the benefit since beginning of 2023, she complains of severe right parietal occipital area headache with light noise sensitivity, nauseous, surrounding, lasting for hours, can happen couple times each week, she also complains of worsening functional status, sitting wheelchair most of the time,   Has no recurrent seizure, she lost follow-up since last injection in August 2022    Update 06/04/2022 JM: Patient returns via [place] video visit for follow-up visit.  Reports improvement of headaches since starting Qulipta 60 mg daily, reports 1 mild headache per day, previously experiencing 2-3 more severe headaches daily, use of Ubrelvy only 1x this month for more severe headache with benefit. Denies OTC overuse.   MR brain not yet completed - reports she has not been contacted to schedule   Slight elevation of ESR (41) and C-reactive protein (12) of unknown clinical significance. Does not indicate temporal arteritis TSH normal (2.630)  Stable from stroke standpoint, has not yet been scheduled for botox for chronic left spastic hemiparesis No seizure activity, remains on Keppra 750 mg twice daily  No further concerns at this time       ROS:   14 system review of systems performed and negative with exception of those listed in HPI  PMH:  Past Medical History:  Diagnosis Date   Chronic pain    "in this left arm and leg that don't move"   CVA (cerebrovascular accident) ([place]) 1995   "  left side paralyzed"   Dental caries    Depression    Diabetes mellitus without complication ([place])    GERD (gastroesophageal reflux disease)    Headache    Hypercholesteremia    Hypertension    Insomnia    Neuropathy    Overactive bladder    Peripheral vascular disease (HCC)    left arm and leg; since stroke 1995   Pneumonia    Seizures  ([place]) 01/27/12   "first one ever"    PSH:  Past Surgical History:  Procedure Laterality Date   CEREBRAL ANEURYSM REPAIR  11/1993   "left my left side paralyzed"   DENTAL RESTORATION/EXTRACTION WITH X-RAY N/A 09/13/2017   Procedure: DENTAL RESTORATION/EXTRACTION;  Surgeon: Diona Browner, DDS;  Location: [facility];  Service: Oral Surgery;  Laterality: N/A;   ESOPHAGOGASTRODUODENOSCOPY N/A 03/28/2020   Procedure: ESOPHAGOGASTRODUODENOSCOPY (EGD);  Surgeon: Clarene Essex, MD;  Location: [facility];  Service: Endoscopy;  Laterality: N/A;  bedside endoscopy   TUBAL LIGATION  04/1984    Social History:  Social History   Socioeconomic History   Marital status: Divorced    Spouse name: Not on file   Number of children: 2   Years of education: 12   Highest education level: High school graduate  Occupational History   Occupation: Disabled  Tobacco Use   Smoking status: Never   Smokeless tobacco: Never  Vaping Use   Vaping Use: Never used  Substance and Sexual Activity   Alcohol use: No   Drug use: No   Sexual activity: Yes  Other Topics Concern   Not on file  Social History Narrative   Lives at home with her grandson.   Right-handed.   No daily caffeine use.   Social Determinants of Health   Financial Resource Strain: Not on file  Food Insecurity: Not on file  Transportation Needs: Not on file  Physical Activity: Not on file  Stress: Not on file  Social Connections: Not on file  Intimate Partner Violence: Not on file    Family History:  Family History  Problem Relation Age of Onset   Dementia Mother    Diabetes type II Mother    Diabetes Mother    Breast cancer Mother    Heart attack Father     Medications:   Current Outpatient Medications on File Prior to Visit  Medication Sig Dispense Refill   acetaminophen (TYLENOL) 325 MG tablet Take 650 mg by mouth every 6 (six) hours as needed for mild pain or headache.     Alcohol Swabs (ALCOHOL PREP) 70 % PADS      ALPRAZolam  (XANAX) 1 MG tablet Take 1-2 tablets 30 minutes prior to MRI, may repeat once as needed. Must have driver. 3 tablet 0   amitriptyline (ELAVIL) 100 MG tablet Take 100 mg by mouth at bedtime.   0   AQUALANCE LANCETS 30G MISC      aspirin 81 MG chewable tablet Chew 1 tablet (81 mg total) by mouth daily. 30 tablet 3   Atogepant (QULIPTA) 60 MG TABS Take 60 mg by mouth daily. 30 tablet 11   atorvastatin (LIPITOR) 80 MG tablet Take 80 mg by mouth daily.     Azelastine HCl 0.15 % SOLN 1-2 sprays in each nostril     benzonatate (TESSALON) 100 MG capsule Take 1 capsule (100 mg total) by mouth 3 (three) times daily as needed for cough. 20 capsule 0   Blood Glucose Calibration (ONETOUCH VERIO) SOLN  botulinum toxin Type A (BOTOX) 100 units SOLR injection Inject 500 Units into the muscle every 3 (three) months. 5 each 3   Cholecalciferol (VITAMIN D) 1000 UNITS capsule Take 1,000 Units by mouth daily.     desoximetasone (TOPICORT) 3.33 % cream 1 application Twice a day Externally     DULoxetine (CYMBALTA) 60 MG capsule Take 60 mg by mouth daily.     esomeprazole (NEXIUM) 40 MG capsule Take 40 mg by mouth 2 (two) times daily.     glimepiride (AMARYL) 4 MG tablet Take 4 mg by mouth every morning.     ketoconazole (NIZORAL) 2 % cream Apply 1 application topically 2 (two) times daily as needed for irritation.     levETIRAcetam (KEPPRA) 750 MG tablet Take 1 tablet (750 mg total) by mouth 2 (two) times daily. 180 tablet 3   lidocaine (XYLOCAINE) 5 % ointment Apply 1 application topically 4 (four) times daily as needed for moderate pain.      meloxicam (MOBIC) 15 MG tablet Take 15 mg by mouth daily.     metoprolol succinate (TOPROL-XL) 25 MG 24 hr tablet Take 1 tablet (25 mg total) by mouth daily. 30 tablet 3   ondansetron (ZOFRAN) 4 MG tablet TAKE 1 TABLET BY MOUTH EVERY 8 HOURS AS NEEDED FOR NAUSEA 20 tablet 5   ONETOUCH VERIO test strip      oxyCODONE (ROXICODONE) 15 MG immediate release tablet Take 15 mg  by mouth 4 (four) times daily as needed for pain.      pregabalin (LYRICA) 75 MG capsule Take 1 capsule (75 mg total) by mouth 2 (two) times daily. TAKE 1 CAPSULE BY MOUTH THREE TIMES DAILY 60 capsule 1   tiZANidine (ZANAFLEX) 2 MG tablet TAKE 2 TABLETS(4 MG) BY MOUTH IN THE MORNING AND AT BEDTIME 360 tablet 3   Ubrogepant (UBRELVY) 50 MG TABS Take 1 tab at onset of migraine.  May repeat in 2 hrs, if needed.  Max dose: 2 tabs/day. This is a 30 day prescription. 12 tablet 6   No current facility-administered medications on file prior to visit.    Allergies:   Allergies  Allergen Reactions   Celebrex [Celecoxib] Swelling and Other (See Comments)    "lips; it was terrible"   Baclofen Swelling   Aspirin    Zolpidem       OBJECTIVE:  Physical Exam N/A d/t visit type       ASSESSMENT/PLAN: PAULENA SERVAIS is a 61 y.o. year old female   Spastic left  hemisphere following hemorrhagic stroke, at age 69, in 2003 Will send message to office staff to assist with scheduling for btoox with 3n for left spastic hemiparesis as previously requested   Long history of chronic migraine             Worsening headaches since beginning of 2023, worsening functional status, desiring further evaluation  Improvement since starting Qulipta 60 mg daily - continue at this time  Continue Ubrelvy as needed             Repeat MRI of the brain - provided GI office number to schedule imaging   Complex partial seizure             Doing well on Keppra 750 mg twice daily     Follow up in 6 months or call earlier if needed   CC:  PCP: Cipriano Mile, NP    I spent 21 minutes of face-to-face and non-face-to-face time with patient via  MyChart video visit.  This included previsit chart review, lab review, study review, order entry, electronic health record documentation, patient education and discussion regarding above diagnoses and treatment plan and answered all the questions to patient's  satisfaction   Frann Rider, [facility]  [facility] [address] [place], [place] [address]  Phone [phone] Fax [phone] Note: This document was prepared with digital dictation and possible smart phrase technology. Any transcriptional errors that result from this process are unintentional.

## 2022-06-04 NOTE — Patient Instructions (Signed)
Please reach out to [facility] imaging at [phone] to schedule an MRI of your brain as previously recommended by Dr. Krista Blue  Continue Qulipta 60 mg daily for headache prevention Continue Ubrevly as needed for abortive headache treatment  You should be contacted by our office to schedule botox for continued left sided spasticity post stroke, if you do not receive a call over the next 7-10 business days, please cal our office  Continue Keppra 750 mg twice daily for seizure prevention    Follow up in 6 months or call earlier if needed

## 2022-06-04 NOTE — Telephone Encounter (Signed)
Phone rep called pt to schedule her NP f/u. The phone rang, no one picked up no option to leave a vm.  This is FYI for POD 3

## 2022-06-04 NOTE — Progress Notes (Signed)
Per Jordan Rivas, Patient will need to be scheduled for 6 mo f/u, as well as scheduled visit with Dr. Krista Rivas for botox. Thank you

## 2022-06-11 ENCOUNTER — Other Ambulatory Visit: Payer: Self-pay | Admitting: Student

## 2022-06-11 DIAGNOSIS — Z1231 Encounter for screening mammogram for malignant neoplasm of breast: Secondary | ICD-10-CM

## 2022-06-27 ENCOUNTER — Telehealth: Payer: Self-pay | Admitting: Podiatry

## 2022-06-27 NOTE — Telephone Encounter (Signed)
Pt is calling to check the status of her diabetic shoes.  Please advise

## 2022-07-02 ENCOUNTER — Other Ambulatory Visit: Payer: Medicare Other

## 2022-07-09 IMAGING — MG MM DIGITAL SCREENING BILAT W/ TOMO AND CAD
8 series · 8 of 24 positions shown · non-contrast
Comparison: Previous exam(s).

ACR Breast Density Category a: The breast tissue is almost entirely
fatty.

CLINICAL DATA: Screening.

EXAM:
DIGITAL SCREENING BILATERAL MAMMOGRAM WITH TOMOSYNTHESIS AND CAD
TECHNIQUE: Bilateral screening digital craniocaudal and mediolateral oblique
mammograms were obtained. Bilateral screening digital breast
tomosynthesis was performed. The images were evaluated with
computer-aided detection.

[L CC synth-2D]
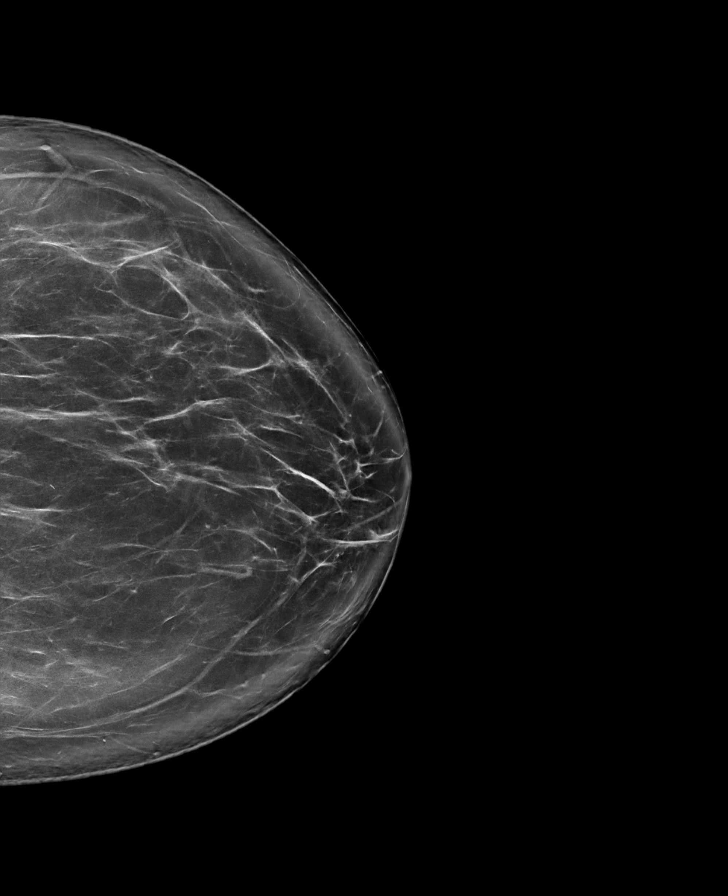

[R MLO synth-2D]
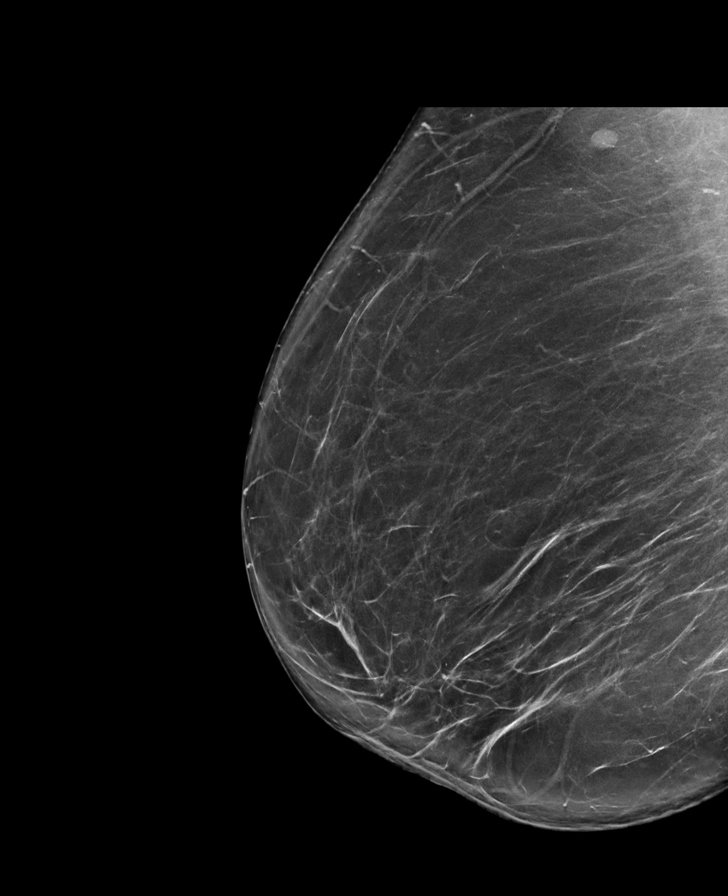

[L MLO synth-2D]
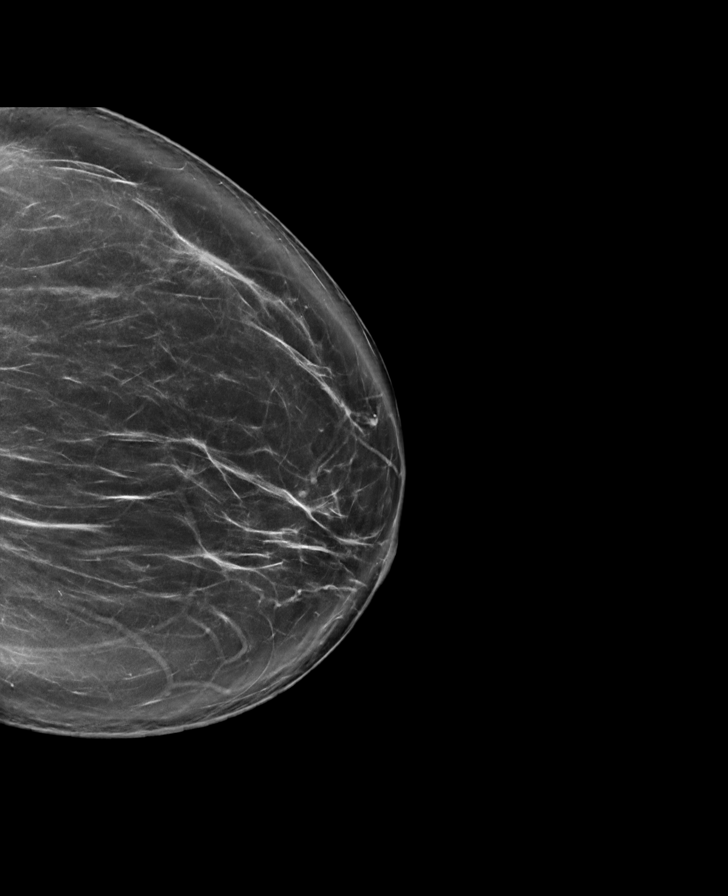

[R CC synth-2D]
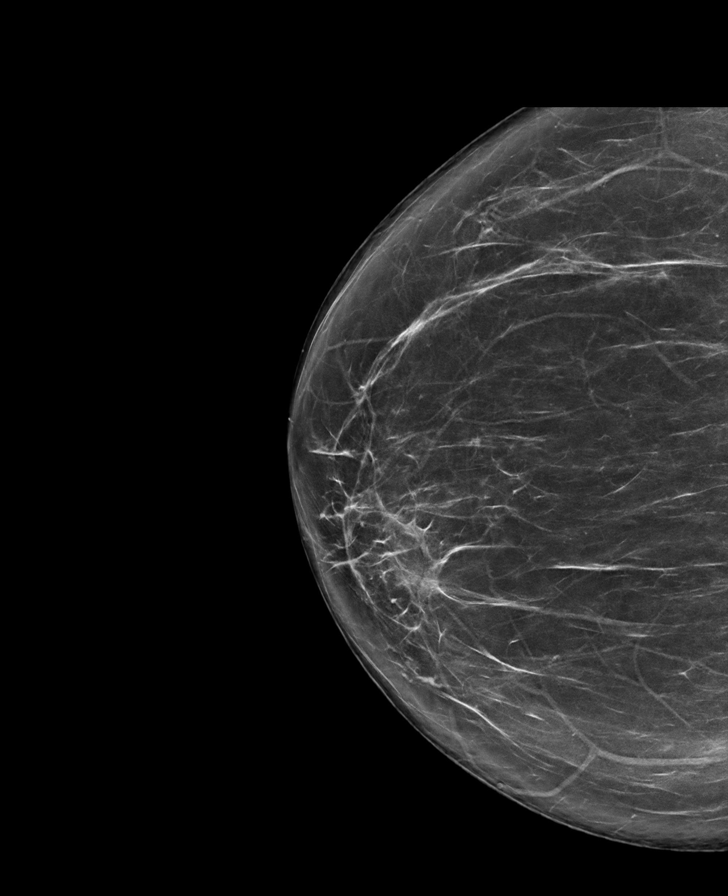

[L MLO tomo · tomo slice 49/98.0]
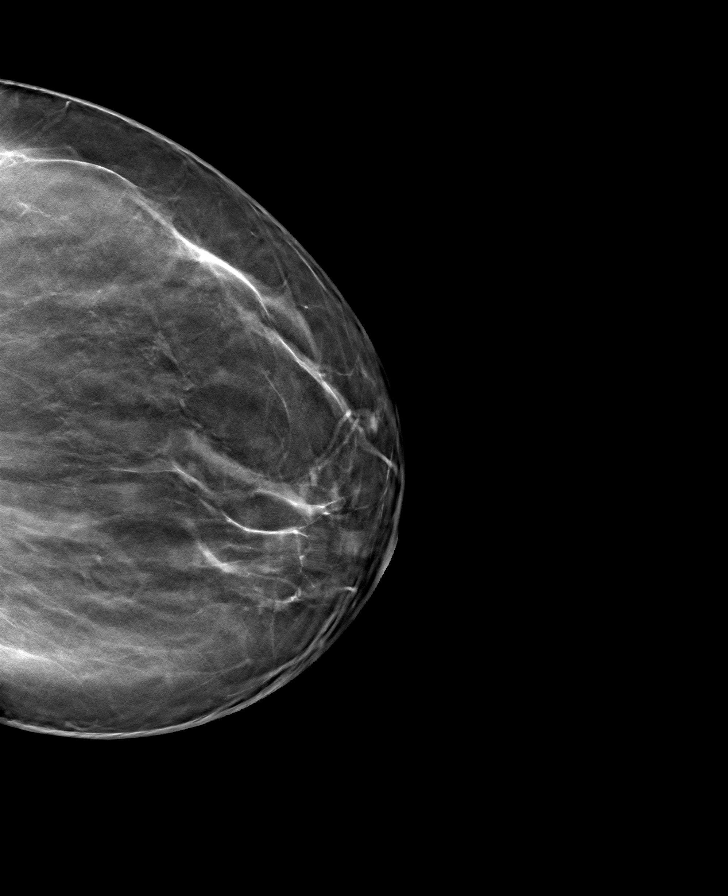

[L CC tomo · tomo slice 51/100.0]
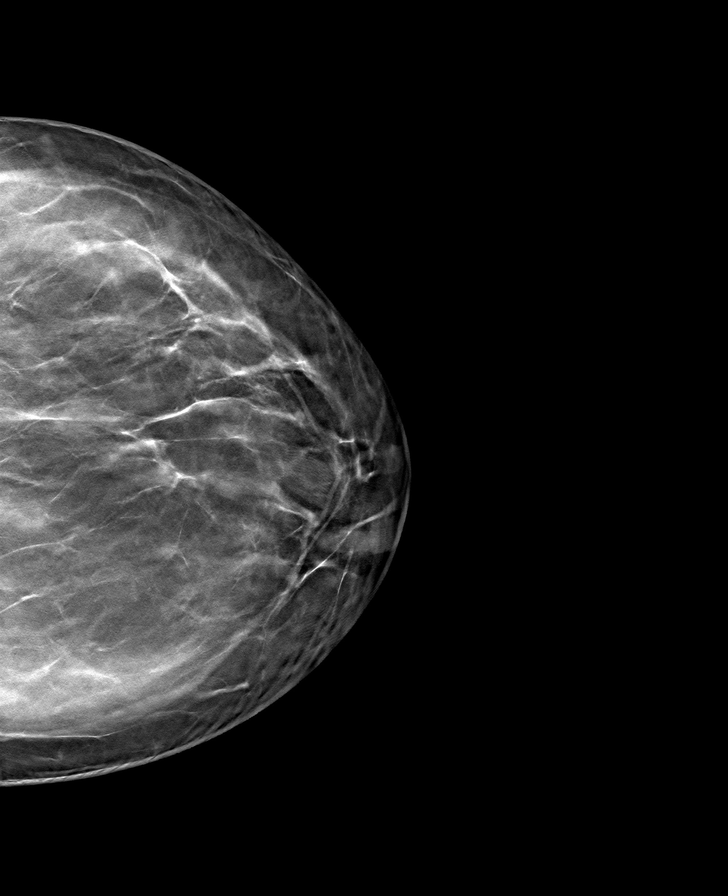

[R MLO tomo · tomo slice 50/99.0]
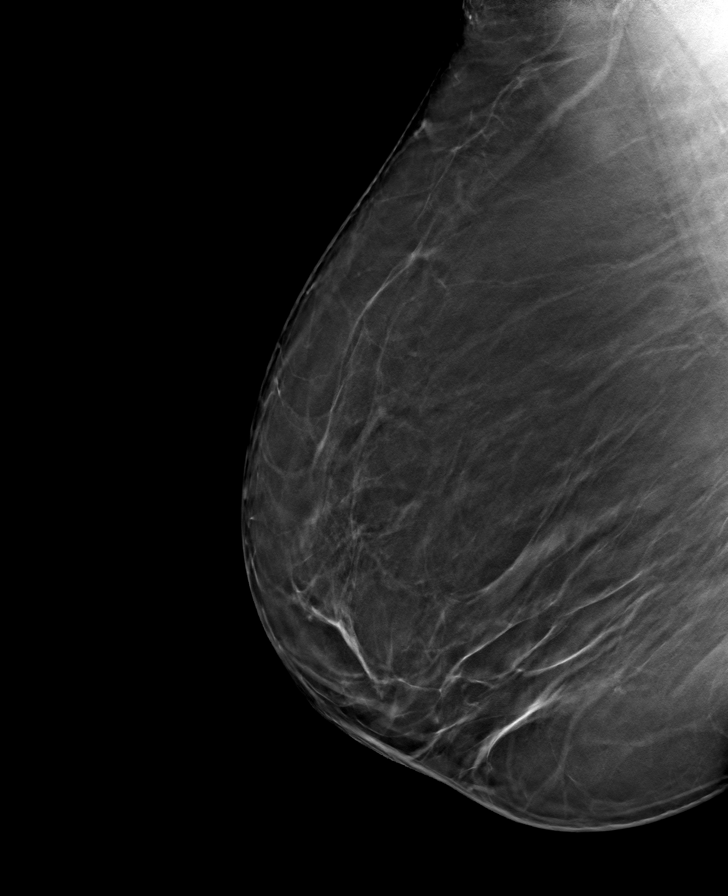

[R CC tomo · tomo slice 47/92.0]
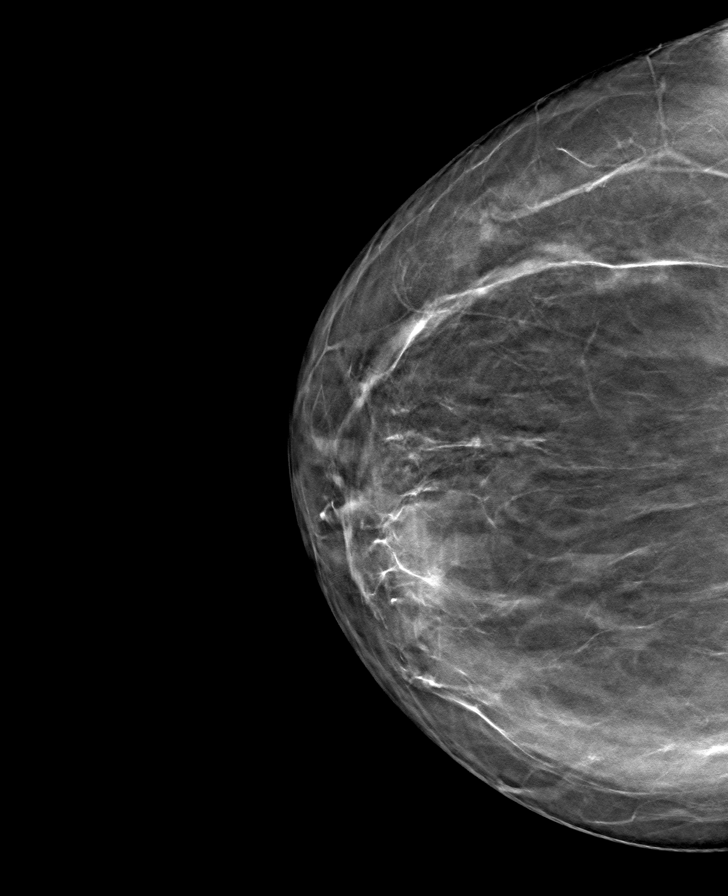

[8 of 24 positions shown; findings below may reference images not displayed]

FINDINGS: There are no findings suspicious for malignancy.
IMPRESSION: No mammographic evidence of malignancy. A result letter of this
screening mammogram will be mailed directly to the patient.

RECOMMENDATION:
Screening mammogram in one year. (Code:0E-3-N98)

BI-RADS CATEGORY  1: Negative.

## 2022-07-11 ENCOUNTER — Other Ambulatory Visit: Payer: Medicare Other

## 2022-07-11 ENCOUNTER — Ambulatory Visit (INDEPENDENT_AMBULATORY_CARE_PROVIDER_SITE_OTHER): Payer: Medicare Other

## 2022-07-11 DIAGNOSIS — M21372 Foot drop, left foot: Secondary | ICD-10-CM

## 2022-07-11 DIAGNOSIS — E1149 Type 2 diabetes mellitus with other diabetic neurological complication: Secondary | ICD-10-CM

## 2022-07-11 NOTE — Progress Notes (Signed)
Patient presents to the office today for diabetic shoe and insole measuring.  Patient was measured with brannock device to determine size and width for 1 pair of extra depth shoes and foam casted for 3 pair of insoles.   Documentation of medical necessity will be sent to patient's treating diabetic doctor to verify and sign.   Patient's diabetic provider: Cipriano Mile, DNP [facility])  Shoes and insoles will be ordered at that time and patient will be notified for an appointment for fitting when they arrive.   Shoe size (per patient): 9.5 DDD   Brannock measurement: 10 left with brace, 8 Right  Patient shoe selection-   Shoe choice:  Patient will need custom shoes to accommodate the brace she is currently wearing. Unable to cast for custom shoes today.  Will collaborate with provider to determine what would be the next steps in getting the patient the shoes she needs.

## 2022-07-12 ENCOUNTER — Ambulatory Visit: Payer: Medicare Other

## 2022-07-18 NOTE — Telephone Encounter (Signed)
Pt is calling back to check on the status of orthotics. States that she has called several times and left messages.  Please advise.

## 2022-08-08 ENCOUNTER — Ambulatory Visit: Payer: Medicare Other

## 2022-08-09 NOTE — Telephone Encounter (Signed)
Spoke with Melissa at [facility] clinic and she stated that they are waiting on the treating diabetic physician to sign the CMN before proceeding. She has mailed the paperwork to the patient but hasn't gotten it back yet.

## 2022-08-13 ENCOUNTER — Telehealth: Payer: Self-pay | Admitting: Adult Health

## 2022-08-13 MED ORDER — METHYLPREDNISOLONE 4 MG PO TBPK: ORAL_TABLET | ORAL | 0 refills | Status: AC

## 2022-08-13 NOTE — Addendum Note (Signed)
Addended by: Frann Rider L on: 08/13/2022 12:30 PM   Modules accepted: Orders

## 2022-08-13 NOTE — Telephone Encounter (Signed)
Pt said have had headache all weekend. Have taken Urbrevly and Quiltpa, get just a little relief. Would like call from the nurse to discuss getting something a little stronger to relief headache.

## 2022-08-13 NOTE — Telephone Encounter (Signed)
Can start a medrol dose pack to see if this helps current migraine. If headache should worsen or associated with stroke like symptoms, please call 911 immediately for more emergent evaluation. Please ensure no overuse of Ubrelvy or OTC pain relievers as this could cause rebound headaches.

## 2022-08-13 NOTE — Telephone Encounter (Signed)
Can start a medrol dose pack to see if this helps current migraine. If headache should worsen or associated with stroke like symptoms, please call 911 immediately for more emergent evaluation. Please ensure no overuse of Ubrelvy or OTC pain relievers as this could cause rebound headaches. She verbally understood and was appreciative.

## 2022-08-13 NOTE — Telephone Encounter (Signed)
Contacted pt back, she stated she has had a migraine all weekend. She is taking Qulipta 60 mg daily and Ubrevly as needed. She got very little relief when taking the Ubrelvy. Do you recommend she try a different abortive treatment ?

## 2022-08-14 NOTE — Telephone Encounter (Addendum)
New PA submitted Key: [id number] Your information has been sent to [facility].

## 2022-08-20 ENCOUNTER — Telehealth: Payer: Self-pay

## 2022-08-20 NOTE — Telephone Encounter (Signed)
Pa pending for Ubrelvy '50MG'$  tablets Key: [id number]

## 2022-08-27 ENCOUNTER — Other Ambulatory Visit: Payer: Self-pay | Admitting: Neurology

## 2022-08-28 NOTE — Telephone Encounter (Signed)
Rx sent 

## 2022-09-06 NOTE — Telephone Encounter (Signed)
Request Reference Number: [id number]. QULIPTA TAB '60MG'$  is approved through 09/03/2023.

## 2022-10-08 ENCOUNTER — Ambulatory Visit: Payer: Medicare Other

## 2022-10-16 ENCOUNTER — Ambulatory Visit: Payer: Medicare Other | Admitting: Nurse Practitioner

## 2022-10-16 NOTE — Progress Notes (Deleted)
Assessment    # Colon cancer screening # Hx of seizures  Plan:        HPI:    Chief Complaint:   Jordan Rivas is a 62 y.o. year old female with a past medical history of DM, Hypertension, CVA, PVD, chronic pain, neuropathy, seizures. See PMH / [place] for additional history..  Patient has a referral in epic from her PCP for colon cancer screening.  Patient wants to discuss having her esophagus stretched   Interval history:   Previous Labs / Imaging::    Latest Ref Rng & Units 02/08/2022    3:34 PM 04/02/2020    3:36 AM 04/01/2020    4:54 AM  CBC  WBC 3.4 - 10.8 x10E3/uL 8.9  12.9  13.4   Hemoglobin 11.1 - 15.9 g/dL 12.0  10.9  10.9   Hematocrit 34.0 - 46.6 % 37.8  35.6  34.9   Platelets 150 - 450 x10E3/uL 278  325  310     No results found for: "LIPASE"    Latest Ref Rng & Units 02/08/2022    3:34 PM 04/02/2020    3:36 AM 04/01/2020    4:54 AM  CMP  Glucose 70 - 99 mg/dL 173  102  100   BUN 8 - 27 mg/dL 10  11  8   $ Creatinine 0.57 - 1.00 mg/dL 0.73  0.65  0.55   Sodium 134 - 144 mmol/L 140  140  140   Potassium 3.5 - 5.2 mmol/L 4.0  3.2  3.3   Chloride 96 - 106 mmol/L 102  107  105   CO2 20 - 29 mmol/L 26  26  26   $ Calcium 8.7 - 10.3 mg/dL 8.9  8.7  8.9   Total Protein 6.0 - 8.5 g/dL 6.9     Total Bilirubin 0.0 - 1.2 mg/dL <0.2     Alkaline Phos 44 - 121 IU/L 155     AST 0 - 40 IU/L 22     ALT 0 - 32 IU/L 20         Previous GI Evaluation   EGD July 2021 for hematemesis ( [facility]) -   Past Medical History:  Diagnosis Date   Chronic pain    "in this left arm and leg that don't move"   CVA (cerebrovascular accident) ([place]) 1995   "left side paralyzed"   Dental caries    Depression    Diabetes mellitus without complication ([place])    GERD (gastroesophageal reflux disease)    Headache    Hypercholesteremia    Hypertension    Insomnia    Neuropathy    Overactive bladder    Peripheral vascular disease ([place])    left arm and leg; since stroke 1995    Pneumonia    Seizures ([place]) 01/27/12   "first one ever"   Past Surgical History:  Procedure Laterality Date   CEREBRAL ANEURYSM REPAIR  11/1993   "left my left side paralyzed"   DENTAL RESTORATION/EXTRACTION WITH X-RAY N/A 09/13/2017   Procedure: DENTAL RESTORATION/EXTRACTION;  Surgeon: Diona Browner, DDS;  Location: [facility];  Service: Oral Surgery;  Laterality: N/A;   ESOPHAGOGASTRODUODENOSCOPY N/A 03/28/2020   Procedure: ESOPHAGOGASTRODUODENOSCOPY (EGD);  Surgeon: Clarene Essex, MD;  Location: [facility];  Service: Endoscopy;  Laterality: N/A;  bedside endoscopy   TUBAL LIGATION  04/1984   Family History  Problem Relation Age of Onset   Dementia Mother    Diabetes type II Mother  Diabetes Mother    Breast cancer Mother    Heart attack Father    Social History   Tobacco Use   Smoking status: Never   Smokeless tobacco: Never  Vaping Use   Vaping Use: Never used  Substance Use Topics   Alcohol use: No   Drug use: No   Current Outpatient Medications  Medication Sig Dispense Refill   acetaminophen (TYLENOL) 325 MG tablet Take 650 mg by mouth every 6 (six) hours as needed for mild pain or headache.     Alcohol Swabs (ALCOHOL PREP) 70 % PADS      ALPRAZolam (XANAX) 1 MG tablet Take 1-2 tablets 30 minutes prior to MRI, may repeat once as needed. Must have driver. 3 tablet 0   amitriptyline (ELAVIL) 100 MG tablet Take 100 mg by mouth at bedtime.   0   AQUALANCE LANCETS 30G MISC      aspirin 81 MG chewable tablet Chew 1 tablet (81 mg total) by mouth daily. 30 tablet 3   Atogepant (QULIPTA) 60 MG TABS Take 60 mg by mouth daily. 30 tablet 11   atorvastatin (LIPITOR) 80 MG tablet Take 80 mg by mouth daily.     Azelastine HCl 0.15 % SOLN 1-2 sprays in each nostril     benzonatate (TESSALON) 100 MG capsule Take 1 capsule (100 mg total) by mouth 3 (three) times daily as needed for cough. 20 capsule 0   Blood Glucose Calibration (ONETOUCH VERIO) SOLN      botulinum toxin Type A (BOTOX) 100  units SOLR injection Inject 500 Units into the muscle every 3 (three) months. 5 each 3   Cholecalciferol (VITAMIN D) 1000 UNITS capsule Take 1,000 Units by mouth daily.     desoximetasone (TOPICORT) [id number] % cream 1 application Twice a day Externally     DULoxetine (CYMBALTA) 60 MG capsule Take 60 mg by mouth daily.     esomeprazole (NEXIUM) 40 MG capsule Take 40 mg by mouth 2 (two) times daily.     glimepiride (AMARYL) 4 MG tablet Take 4 mg by mouth every morning.     ketoconazole (NIZORAL) 2 % cream Apply 1 application topically 2 (two) times daily as needed for irritation.     levETIRAcetam (KEPPRA) 750 MG tablet Take 1 tablet (750 mg total) by mouth 2 (two) times daily. 180 tablet 3   lidocaine (XYLOCAINE) 5 % ointment Apply 1 application topically 4 (four) times daily as needed for moderate pain.      meloxicam (MOBIC) 15 MG tablet Take 15 mg by mouth daily.     methylPREDNISolone (MEDROL DOSEPAK) 4 MG TBPK tablet Taper pack as directed 1 each 0   metoprolol succinate (TOPROL-XL) 25 MG 24 hr tablet Take 1 tablet (25 mg total) by mouth daily. 30 tablet 3   ondansetron (ZOFRAN) 4 MG tablet TAKE 1 TABLET BY MOUTH EVERY 8 HOURS AS NEEDED FOR NAUSEA 20 tablet 5   ONETOUCH VERIO test strip      oxyCODONE (ROXICODONE) 15 MG immediate release tablet Take 15 mg by mouth 4 (four) times daily as needed for pain.      pregabalin (LYRICA) 75 MG capsule Take 1 capsule (75 mg total) by mouth 2 (two) times daily. TAKE 1 CAPSULE BY MOUTH THREE TIMES DAILY 60 capsule 1   tiZANidine (ZANAFLEX) 2 MG tablet TAKE 2 TABLETS(4 MG) BY MOUTH IN THE MORNING AND AT BEDTIME 360 tablet 3   Ubrogepant (UBRELVY) 50 MG TABS TAKE 1 TABLET BY MOUTH  AT ONSET OF MIGRAINE. MAY REPEAT IN 2 HOURS, AS NEEDED. MAX DOSE: 2 TABLETS DAILY. THIS IS A 30 DAY PRESCRIPTION 12 tablet 6   No current facility-administered medications for this visit.   Allergies  Allergen Reactions   Celebrex [Celecoxib] Swelling and Other (See Comments)     "lips; it was terrible"   Baclofen Swelling   Aspirin    Zolpidem      Review of Systems: Positive for ***.  All other systems reviewed and negative except where noted in HPI.   Wt Readings from Last 3 Encounters:  02/08/22 201 lb (91.2 kg)  04/26/21 201 lb (91.2 kg)  01/18/21 200 lb 8 oz (90.9 kg)    Physical Exam   LMP 09/30/2011  Constitutional:  Pleasant, generally well appearing ***female in no acute distress. Psychiatric:  Normal mood and affect. Behavior is normal. EENT: Pupils normal.  Conjunctivae are normal. No scleral icterus. Neck supple.  Cardiovascular: Normal rate, regular rhythm.  Pulmonary/chest: Effort normal and breath sounds normal. No wheezing, rales or rhonchi. Abdominal: Soft, nondistended, nontender. Bowel sounds active throughout. There are no masses palpable. No hepatomegaly. Neurological: Alert and oriented to person place and time. Extremities: *** No edema Skin: Skin is warm and dry. No rashes noted.  Tye Savoy, NP  10/16/2022, 9:16 AM  Cc:  Referring Provider Cipriano Mile, NP

## 2022-10-24 ENCOUNTER — Telehealth: Payer: Self-pay | Admitting: Adult Health

## 2022-10-24 NOTE — Telephone Encounter (Signed)
Pt is calling stating she is waiting on a nurse to f/u with her on botox. Pt is requesting a call from nurse

## 2022-10-26 ENCOUNTER — Other Ambulatory Visit (HOSPITAL_COMMUNITY): Payer: Self-pay

## 2022-10-26 ENCOUNTER — Telehealth: Payer: Self-pay

## 2022-10-26 NOTE — Telephone Encounter (Signed)
Pharmacy Patient Advocate Encounter   Received notification from [facility] that prior authorization for Botox 500 Units is required/requested.   PA submitted on 10/26/2022 to (ins) [employer] via [place] Status is pending    Benefit Verification [id number] Submitted! Botox One

## 2022-10-30 ENCOUNTER — Other Ambulatory Visit (HOSPITAL_COMMUNITY): Payer: Self-pay

## 2022-10-30 NOTE — Telephone Encounter (Signed)
   This will be [place] and [employer]

## 2022-10-30 NOTE — Telephone Encounter (Signed)
Pt has auth on file (see 10/26/22 phone note) LVM asking pt to call back to schedule botox with Dr. Krista Blue

## 2022-11-22 ENCOUNTER — Ambulatory Visit: Payer: Medicare Other

## 2022-12-25 ENCOUNTER — Encounter: Payer: Self-pay | Admitting: Neurology

## 2022-12-25 ENCOUNTER — Ambulatory Visit: Payer: Medicare Other | Admitting: Neurology

## 2023-01-30 ENCOUNTER — Other Ambulatory Visit: Payer: Self-pay | Admitting: Neurology

## 2023-02-12 NOTE — Telephone Encounter (Signed)
Called pt and got her scheduled with Dr. Terrace Arabia for 04/03/23 at 11:00 am. Pt has auth on file already from [facility]: [id number] (10/26/22-10/27/23).

## 2023-02-12 NOTE — Telephone Encounter (Signed)
Pt stated she would like to move forward with Botox in her arm. She is requesting a call back.

## 2023-02-16 ENCOUNTER — Other Ambulatory Visit: Payer: Self-pay | Admitting: Neurology

## 2023-02-26 ENCOUNTER — Telehealth: Payer: Self-pay | Admitting: Neurology

## 2023-02-26 NOTE — Telephone Encounter (Signed)
Call to patient, no answer. Left message to call office tomorrow concerning headaches and botox.

## 2023-02-26 NOTE — Telephone Encounter (Signed)
Pt stated she needs Botox back in her arm. Stated she is going to bed with a headache and waking up with a headache.

## 2023-02-27 NOTE — Telephone Encounter (Signed)
2ND ATTEMPT  Called [phone] but was not able to leave msg

## 2023-02-28 NOTE — Telephone Encounter (Signed)
3rd and final attempt unable to leave msg as mailbox is full. Closing out encounter

## 2023-03-16 ENCOUNTER — Other Ambulatory Visit: Payer: Self-pay | Admitting: Neurology

## 2023-04-03 ENCOUNTER — Encounter: Payer: Self-pay | Admitting: Neurology

## 2023-04-03 ENCOUNTER — Ambulatory Visit: Payer: 59 | Admitting: Neurology

## 2023-04-03 VITALS — BP 99/63 | HR 86 | Ht 61.0 in | Wt 184.0 lb

## 2023-04-03 DIAGNOSIS — G40909 Epilepsy, unspecified, not intractable, without status epilepticus: Secondary | ICD-10-CM

## 2023-04-03 DIAGNOSIS — G43709 Chronic migraine without aura, not intractable, without status migrainosus: Secondary | ICD-10-CM | POA: Diagnosis not present

## 2023-04-03 DIAGNOSIS — I69354 Hemiplegia and hemiparesis following cerebral infarction affecting left non-dominant side: Secondary | ICD-10-CM

## 2023-04-03 MED ORDER — ONABOTULINUMTOXINA 200 UNITS IJ SOLR
600.0000 [IU] | Freq: Once | INTRAMUSCULAR | Status: AC
  Administered 2023-04-03: 600 [IU] via INTRAMUSCULAR

## 2023-04-03 NOTE — Progress Notes (Signed)
Chief Complaint  Patient presents with   Injections    Rm 14. Botox. Patient signed consent, with husband .      ASSESSMENT AND PLAN  Jordan Rivas is a 62 y.o. female   Spastic left  hemisphere following hemorrhagic stroke, at age 2, in 2003 CT head showed spastic left hemiparesis due to right basal ganglia and right frontal parietal region infarction She responded very well to previous electrical stimulation guided botulism toxin injection for spastic left upper extremity  Complex partial seizure             Doing well on Keppra 750 mg twice daily  Electrical stimulation guided Botox a injection for spastic left upper extremity, used 600 units  Left pectoralis major 100 units Left latissimus dorsi 100 units Left brachialis 100 units Left pronator teres 100 units Left flexor digitorum profundus 50 units Left flexor digitorum superficialis 50 units Left palmaris longus 50 units Left opponents 50 units  DIAGNOSTIC DATA (LABS, IMAGING, TESTING) - I reviewed patient records, labs, notes, testing and imaging myself where available.   MEDICAL HISTORY:  Jordan Rivas is on 62 years old African American female, here to follow-up for spastic left hemiparesis following right basal ganglion hemorrhagic stroke at age 49 in 2003, complex partial seizure, chronic migraine headaches,   She had a history of right hemisphere hemorrhagic stroke due to brain aneurysm at age 39, was residual severe left spastic hemiparesis, left visual field cut, profound gait difficulty,   She presented with seizure on Jan 26 2013, she woke up in the middle of the night using bathroom, while walking towards the bathroom, she fell, with witnessed seizure-like activity, tongue biting, woke up confused, second episode of seizure was on June 19, 2017, fell out of the bed for unknown reasons, now taking Keppra 750 twice a day,   MRI of the brain demonstrate extensive encephalomalacia involving right basal  ganglion, perisylvian fissure region, no acute lesions,   She has been receiving Botox injection for spastic left hemiparesis since 2015,    She had long history of chronic migraine headaches, complains of increased headache over the past few months, previously her headache responding well to it aimovig as preventive medication, Bernita Raisin as needed, but gradually lost the benefit since beginning of 2023, she complains of severe right parietal occipital area headache with light noise sensitivity, nauseous, surrounding, lasting for hours, can happen couple times each week, she also complains of worsening functional status, sitting wheelchair most of the time,    Update July/31/2024: Last injection for spastic left upper extremity was in August 2022, lost follow-up due to transportation issues   She complains of increased left shoulder, elbow pain missing injection, last injection was in June 2023, tolerating Keppra 750 twice a day, has no recurrent seizure  PHYSICAL EXAM:   Vitals:   04/03/23 1108  BP: 99/63  Pulse: 86  Weight: 184 lb (83.5 kg)  Height: 5\' 1"  (1.549 m)   Body mass index is 34.77 kg/m.  PHYSICAL EXAMNIATION:  Gen: NAD, conversant, well nourised, well groomed                     Cardiovascular: Regular rate rhythm, no peripheral edema, warm, nontender. Eyes: Conjunctivae clear without exudates or hemorrhage Neck: Supple, no carotid bruits. Pulmonary: Clear to auscultation bilaterally   NEUROLOGICAL EXAM:  MENTAL STATUS: Speech/cognition: Awake, alert, oriented to history taking and casual conversation CRANIAL NERVES: CN II: Visual fields are full  to confrontation. Pupils are round equal and briskly reactive to light. CN III, IV, VI: extraocular movement are normal. No ptosis. CN V: Facial sensation is intact to light touch CN VII: Face is symmetric with normal eye closure  CN VIII: Hearing is normal to causal conversation. CN IX, X: Phonation is normal. CN XI:  Head turning and shoulder shrug are intact  MOTOR: Spastic left hemiparesis, no antigravity movement of left upper extremity, tendency for elbow pronation, finger flexion, complains of pain on passive extension of left shoulder  Wear left AFO, antigravity movement of left proximal lower extremity muscle  REFLEXES: Hyperreflexia of left upper and lower extremity muscles  SENSORY: Intact to light touch,  COORDINATION: There is no trunk or limb dysmetria noted.  GAIT/STANCE: Deferred  REVIEW OF SYSTEMS:  Full 14 system review of systems performed and notable only for as above All other review of systems were negative.   ALLERGIES: Allergies  Allergen Reactions   Celebrex [Celecoxib] Swelling and Other (See Comments)    "lips; it was terrible"   Baclofen Swelling   Aspirin    Zolpidem     HOME MEDICATIONS: Current Outpatient Medications  Medication Sig Dispense Refill   acetaminophen (TYLENOL) 325 MG tablet Take 650 mg by mouth every 6 (six) hours as needed for mild pain or headache.     Alcohol Swabs (ALCOHOL PREP) 70 % PADS      ALPRAZolam (XANAX) 1 MG tablet Take 1-2 tablets 30 minutes prior to MRI, may repeat once as needed. Must have driver. 3 tablet 0   amitriptyline (ELAVIL) 100 MG tablet Take 100 mg by mouth at bedtime.   0   AQUALANCE LANCETS 30G MISC      aspirin 81 MG chewable tablet Chew 1 tablet (81 mg total) by mouth daily. 30 tablet 3   atorvastatin (LIPITOR) 80 MG tablet Take 80 mg by mouth daily.     Azelastine HCl 0.15 % SOLN 1-2 sprays in each nostril     benzonatate (TESSALON) 100 MG capsule Take 1 capsule (100 mg total) by mouth 3 (three) times daily as needed for cough. 20 capsule 0   Blood Glucose Calibration (ONETOUCH VERIO) SOLN      botulinum toxin Type A (BOTOX) 100 units SOLR injection Inject 500 Units into the muscle every 3 (three) months. 5 each 3   Cholecalciferol (VITAMIN D) 1000 UNITS capsule Take 1,000 Units by mouth daily.      cyclobenzaprine (FEXMID) 7.5 MG tablet Take 7.5 mg by mouth every 8 (eight) hours as needed.     desoximetasone (TOPICORT) 0.05 % cream 1 application Twice a day Externally     DULoxetine (CYMBALTA) 60 MG capsule Take 60 mg by mouth daily.     esomeprazole (NEXIUM) 40 MG capsule Take 40 mg by mouth 2 (two) times daily.     glimepiride (AMARYL) 4 MG tablet Take 4 mg by mouth every morning.     ketoconazole (NIZORAL) 2 % cream Apply 1 application topically 2 (two) times daily as needed for irritation.     levETIRAcetam (KEPPRA) 750 MG tablet TAKE 1 TABLET(750 MG) BY MOUTH TWICE DAILY 180 tablet 3   lidocaine (XYLOCAINE) 5 % ointment Apply 1 application topically 4 (four) times daily as needed for moderate pain.      meloxicam (MOBIC) 15 MG tablet Take 15 mg by mouth daily.     methylPREDNISolone (MEDROL DOSEPAK) 4 MG TBPK tablet Taper pack as directed 1 each 0   metoprolol  succinate (TOPROL-XL) 25 MG 24 hr tablet Take 1 tablet (25 mg total) by mouth daily. 30 tablet 3   ondansetron (ZOFRAN) 4 MG tablet TAKE 1 TABLET BY MOUTH EVERY 8 HOURS AS NEEDED FOR NAUSEA 20 tablet 5   ONETOUCH VERIO test strip      oxyCODONE (ROXICODONE) 15 MG immediate release tablet Take 15 mg by mouth 4 (four) times daily as needed for pain.      pregabalin (LYRICA) 75 MG capsule Take 1 capsule (75 mg total) by mouth 2 (two) times daily. TAKE 1 CAPSULE BY MOUTH THREE TIMES DAILY 60 capsule 1   QULIPTA 60 MG TABS TAKE 1 TABLET BY MOUTH DAILY 30 tablet 11   Ubrogepant (UBRELVY) 50 MG TABS TAKE 1 TABLET BY MOUTH AT ONSET OF MIGRAINE. MAY REPEAT IN 2 HOURS, AS NEEDED. MAX DOSE: 2 TABLETS DAILY. THIS IS A 30 DAY PRESCRIPTION 12 tablet 6   tiZANidine (ZANAFLEX) 2 MG tablet TAKE 2 TABLETS(4 MG) BY MOUTH IN THE MORNING AND AT BEDTIME (Patient not taking: Reported on 04/03/2023) 360 tablet 3   No current facility-administered medications for this visit.    PAST MEDICAL HISTORY: Past Medical History:  Diagnosis Date   Chronic  pain    "in this left arm and leg that don't move"   CVA (cerebrovascular accident) (HCC) 1995   "left side paralyzed"   Dental caries    Depression    Diabetes mellitus without complication (HCC)    GERD (gastroesophageal reflux disease)    Headache    Hypercholesteremia    Hypertension    Insomnia    Neuropathy    Overactive bladder    Peripheral vascular disease (HCC)    left arm and leg; since stroke 1995   Pneumonia    Seizures (HCC) 01/27/12   "first one ever"    PAST SURGICAL HISTORY: Past Surgical History:  Procedure Laterality Date   CEREBRAL ANEURYSM REPAIR  11/1993   "left my left side paralyzed"   DENTAL RESTORATION/EXTRACTION WITH X-RAY N/A 09/13/2017   Procedure: DENTAL RESTORATION/EXTRACTION;  Surgeon: Ocie Doyne, DDS;  Location: [facility] OR;  Service: Oral Surgery;  Laterality: N/A;   ESOPHAGOGASTRODUODENOSCOPY N/A 03/28/2020   Procedure: ESOPHAGOGASTRODUODENOSCOPY (EGD);  Surgeon: Vida Rigger, MD;  Location: [facility];  Service: Endoscopy;  Laterality: N/A;  bedside endoscopy   TUBAL LIGATION  04/1984    FAMILY HISTORY: Family History  Problem Relation Age of Onset   Dementia Mother    Diabetes type II Mother    Diabetes Mother    Breast cancer Mother    Heart attack Father     SOCIAL HISTORY: Social History   Socioeconomic History   Marital status: Divorced    Spouse name: Not on file   Number of children: 2   Years of education: 12   Highest education level: High school graduate  Occupational History   Occupation: Disabled  Tobacco Use   Smoking status: Never   Smokeless tobacco: Never  Vaping Use   Vaping status: Never Used  Substance and Sexual Activity   Alcohol use: No   Drug use: No   Sexual activity: Yes  Other Topics Concern   Not on file  Social History Narrative   Lives at home with her grandson.   Right-handed.   No daily caffeine use.   Social Determinants of Health   Financial Resource Strain: Not on file  Food  Insecurity: Not on file  Transportation Needs: Not on file  Physical Activity: Not  on file  Stress: Not on file  Social Connections: Not on file  Intimate Partner Violence: Not on file      Levert Feinstein, M.D. Ph.D.  [facility] [address] [place], [place] [address] Ph: [phone] Fax: [phone]  CC:  Hillery Aldo, NP [address] [place],  [place] [address]  Hillery Aldo, NP

## 2023-04-03 NOTE — Progress Notes (Signed)
Botox- 200 units x 3 vial Lot: [id number] Expiration: 12.2026 NDC: 0023-3921-02  Bacteriostatic 0.9% Sodium Chloride- 12 mL  Lot: [id number] Expiration: 11.01.2025 NDC: 4010272536  Dx: U44.034 B/B Witnessed by Cinda Quest CMA

## 2023-04-04 ENCOUNTER — Other Ambulatory Visit: Payer: Self-pay | Admitting: Neurology

## 2023-04-18 ENCOUNTER — Ambulatory Visit: Payer: Medicare HMO | Admitting: Neurology

## 2023-04-18 ENCOUNTER — Encounter: Payer: Self-pay | Admitting: Neurology

## 2023-05-13 ENCOUNTER — Other Ambulatory Visit (HOSPITAL_COMMUNITY): Payer: Self-pay

## 2023-05-14 ENCOUNTER — Telehealth: Payer: Self-pay

## 2023-05-14 ENCOUNTER — Other Ambulatory Visit (HOSPITAL_COMMUNITY): Payer: Self-pay

## 2023-05-14 NOTE — Telephone Encounter (Signed)
Pharmacy Patient Advocate Encounter   Received notification from Fax that prior authorization for Qulipta 60MG  Tablet is required/requested.   Insurance verification completed.   The patient is insured through [place] .   Per test claim: PA required; PA submitted to [facility] via Fax Key/confirmation #/EOC [id number] Status is pending

## 2023-05-14 NOTE — Telephone Encounter (Signed)
Pharmacy Patient Advocate Encounter   Received notification from Fax that prior authorization for Ubrelvy 50MG  Tablet is required/requested.   Insurance verification completed.   The patient is insured through [employer] .   Per test claim: PA required; PA submitted to [facility] via Fax Key/confirmation #/EOC [id number] Status is pending

## 2023-05-16 NOTE — Telephone Encounter (Signed)
Pharmacy Patient Advocate Encounter  Received notification from [facility] that Prior Authorization for Qulipta 60MG  Tablet has been APPROVED from 09/03/2022 to 09/03/2023   PA #/Case ID/Reference #: [id number]

## 2023-05-24 NOTE — Telephone Encounter (Signed)
Resent Fax with additional info and chart notes-awaiting determination.

## 2023-05-31 ENCOUNTER — Other Ambulatory Visit (HOSPITAL_COMMUNITY): Payer: Self-pay

## 2023-05-31 NOTE — Telephone Encounter (Signed)
     Successful paid claim for 12 tablets.

## 2023-06-03 NOTE — Telephone Encounter (Signed)
Faxed [employer] auth form with OV notes to [phone].

## 2023-06-03 NOTE — Telephone Encounter (Signed)
Received approval from [facility] thru 09/03/23.

## 2023-06-11 NOTE — Telephone Encounter (Signed)
Pt no longer has [employer], she went back to [facility] plan. Submitted auth via [employer] portal and received instant approval for buy/bill.  Auth: [id number] (06/11/23-06/10/24)

## 2023-06-14 ENCOUNTER — Other Ambulatory Visit: Payer: Self-pay | Admitting: Student

## 2023-06-14 DIAGNOSIS — Z1231 Encounter for screening mammogram for malignant neoplasm of breast: Secondary | ICD-10-CM

## 2023-06-26 ENCOUNTER — Ambulatory Visit (INDEPENDENT_AMBULATORY_CARE_PROVIDER_SITE_OTHER): Payer: 59 | Admitting: Neurology

## 2023-06-26 VITALS — BP 120/73

## 2023-06-26 DIAGNOSIS — I69354 Hemiplegia and hemiparesis following cerebral infarction affecting left non-dominant side: Secondary | ICD-10-CM | POA: Diagnosis not present

## 2023-06-26 MED ORDER — ONABOTULINUMTOXINA 100 UNITS IJ SOLR
600.0000 [IU] | Freq: Once | INTRAMUSCULAR | Status: AC
Start: 2023-06-26 — End: 2023-06-26
  Administered 2023-06-26: 600 [IU] via INTRAMUSCULAR

## 2023-06-26 NOTE — Progress Notes (Signed)
   Chief Complaint  Patient presents with   Injections    Rm14, husband present, Pt is well and ready for injection      Jordan Rivas is a 62 y.o. female   Spastic left  hemisphere following hemorrhagic stroke, at age 77, in 2003 CT head showed  right basal ganglia and right frontal parietal region infarction   Electrical stimulation guided Botox a injection for spastic left upper extremity, used 600 units (100 units BOTOX /2 cc NS)  Left pectoralis major 150 units Left latissimus dorsi 200 units Left brachialis 50 units Left pronator teres 50 units Left flexor digitorum profundus 50 units Left flexor digitorum superficialis 50 units Left palmaris longus 50 units  Levert Feinstein, M.D. Ph.D.  [facility] [address] [place], [place] [address] Phone: [phone] Fax:      [phone]

## 2023-06-26 NOTE — Progress Notes (Signed)
Botox- 100 units x 6 vial Lot: [id number] Expiration: 03.2027 NDC: 0865-7846-96 BB  Bacteriostatic 0.9% Sodium Chloride- 12 mL  Lot: [id number]  Expiration: 04.01.2025 NDC: 2952841324  Dx: M01.027

## 2023-07-05 ENCOUNTER — Ambulatory Visit: Payer: 59

## 2023-07-29 ENCOUNTER — Telehealth: Payer: Self-pay | Admitting: Neurology

## 2023-07-29 NOTE — Telephone Encounter (Signed)
Pt. called wanting to speak to the RN regarding the headache she has had for the last two day's. Please advise.

## 2023-07-29 NOTE — Telephone Encounter (Signed)
Unable to leave msg 1st attempt

## 2023-07-30 NOTE — Telephone Encounter (Signed)
Unable to leave msg 2nd attempt

## 2023-07-30 NOTE — Telephone Encounter (Signed)
Pt has returned the call to [place], CMA

## 2023-07-30 NOTE — Telephone Encounter (Signed)
Lmtrc 1st attempt

## 2023-08-05 NOTE — Telephone Encounter (Signed)
Unable to leave msg to return phone call second attempt

## 2023-08-06 NOTE — Telephone Encounter (Signed)
Unable to leave msg to return phone call  Closing encounter 3rd and final attempt made

## 2023-09-18 ENCOUNTER — Other Ambulatory Visit (HOSPITAL_COMMUNITY): Payer: Self-pay

## 2023-09-19 ENCOUNTER — Ambulatory Visit (INDEPENDENT_AMBULATORY_CARE_PROVIDER_SITE_OTHER): Payer: 59 | Admitting: Neurology

## 2023-09-19 DIAGNOSIS — G43709 Chronic migraine without aura, not intractable, without status migrainosus: Secondary | ICD-10-CM

## 2023-09-19 DIAGNOSIS — I69354 Hemiplegia and hemiparesis following cerebral infarction affecting left non-dominant side: Secondary | ICD-10-CM

## 2023-09-19 MED ORDER — UBRELVY 100 MG PO TABS: 100.0000 mg | ORAL_TABLET | ORAL | 11 refills | Status: DC

## 2023-09-19 MED ORDER — INCOBOTULINUMTOXINA 100 UNITS IM SOLR
600.0000 [IU] | INTRAMUSCULAR | Status: DC
Start: 2023-09-19 — End: 2024-05-01
  Administered 2023-09-19: 600 [IU] via INTRAMUSCULAR

## 2023-09-19 NOTE — Progress Notes (Signed)
xeomin 100units x 6 vial  Ndc-[id number] B/B  Bacteriostatic 0.9% Sodium Chloride- 12mL  [id number] Expiration: 07/04/24 NDC: [id number] Dx: W29.562  WITNESSED [id number] C CMA

## 2023-09-19 NOTE — Progress Notes (Signed)
   Chief Complaint  Patient presents with   Injections    Pt in 14, here with husband Anette Riedel, PT READY FOR INJECTION      Jordan Rivas is a 63 y.o. female   Spastic left  hemisphere following hemorrhagic stroke, at age 54, in 2003 CT head showed  right basal ganglia and right frontal parietal region infarction   Electrical stimulation guided Botox a injection for spastic left upper extremity, used 600 units (100 units BOTOX /2 cc NS)  Left pectoralis major 150 units Left latissimus dorsi 200 units Left brachialis 50 units Left pronator teres 50 units Left flexor digitorum profundus 50 units Left flexor digitorum superficialis 50 units Left palmaris longus 50 units  Less optimal response to qulipta, changed to Ubrevyl 100mg  as needed.  Levert Feinstein, M.D. Ph.D.  [facility] [address] [place], [place] [address] Phone: [phone] Fax:      [phone]

## 2023-10-11 ENCOUNTER — Other Ambulatory Visit (HOSPITAL_COMMUNITY): Payer: Self-pay

## 2023-10-11 ENCOUNTER — Telehealth: Payer: Self-pay | Admitting: Pharmacy Technician

## 2023-10-11 NOTE — Telephone Encounter (Signed)
 Pharmacy Patient Advocate Encounter   Received notification from CoverMyMeds that prior authorization for Ubrelvy  50MG  tablets is required/requested.   Insurance verification completed.   The patient is insured through [place] .   Per test claim: Refill too soon. PA is not needed at this time. Medication was filled 09/19/2023. Next eligible fill date is 10/12/2023.

## 2023-11-12 ENCOUNTER — Ambulatory Visit: Payer: 59

## 2023-11-14 MED ORDER — UBRELVY 50 MG PO TABS: ORAL_TABLET | ORAL | 6 refills | Status: DC

## 2023-11-14 NOTE — Addendum Note (Signed)
 Addended by: Danne Harbor on: 11/14/2023 02:34 PM   Modules accepted: Orders

## 2023-11-22 ENCOUNTER — Ambulatory Visit
Admission: RE | Admit: 2023-11-22 | Discharge: 2023-11-22 | Disposition: A | Discharge status: Home / Self Care | Source: Ambulatory Visit | Attending: Student | Admitting: Student

## 2023-11-22 DIAGNOSIS — Z1231 Encounter for screening mammogram for malignant neoplasm of breast: Secondary | ICD-10-CM

## 2023-12-02 ENCOUNTER — Other Ambulatory Visit: Payer: Self-pay

## 2023-12-02 MED ORDER — UBRELVY 100 MG PO TABS: 100.0000 mg | ORAL_TABLET | ORAL | 11 refills | Status: DC

## 2023-12-11 ENCOUNTER — Telehealth: Payer: Self-pay | Admitting: Neurology

## 2023-12-11 ENCOUNTER — Ambulatory Visit: Admitting: Family Medicine

## 2023-12-11 NOTE — Telephone Encounter (Signed)
 MYC conf

## 2023-12-12 ENCOUNTER — Ambulatory Visit: Payer: 59 | Admitting: Neurology

## 2023-12-12 VITALS — BP 146/84

## 2023-12-12 DIAGNOSIS — I69354 Hemiplegia and hemiparesis following cerebral infarction affecting left non-dominant side: Secondary | ICD-10-CM

## 2023-12-12 DIAGNOSIS — G43709 Chronic migraine without aura, not intractable, without status migrainosus: Secondary | ICD-10-CM

## 2023-12-12 DIAGNOSIS — G40909 Epilepsy, unspecified, not intractable, without status epilepticus: Secondary | ICD-10-CM

## 2023-12-12 MED ORDER — INCOBOTULINUMTOXINA 100 UNITS IM SOLR
600.0000 [IU] | Freq: Once | INTRAMUSCULAR | Status: DC
Start: 2023-12-12 — End: 2023-12-12

## 2023-12-12 MED ORDER — INCOBOTULINUMTOXINA 100 UNITS IM SOLR
600.0000 [IU] | Freq: Once | INTRAMUSCULAR | Status: AC
Start: 2023-12-12 — End: 2023-12-12
  Administered 2023-12-12: 600 [IU] via INTRAMUSCULAR

## 2023-12-12 NOTE — Progress Notes (Signed)
 Chief Complaint  Patient presents with   Injections    Pt in 14,  Pt is here for Xeomin injections for spastic hemiplegia.       ASSESSMENT AND PLAN  [name] is a 63 y.o. female   Spastic left  hemisphere following hemorrhagic stroke, at age 48, in 2003 CT head showed spastic left hemiparesis due to right basal ganglia and right frontal parietal region infarction She responded very well to previous electrical stimulation guided botulism toxin injection for spastic left upper extremity  Complex partial seizure             Doing well on Keppra 750 mg twice daily  Chronic migraine headaches  Frequent daily headache taking Ubrelvy as needed,   Electrical stimulation guided Botox a injection for spastic left upper extremity, used 600 units (100 units BOTOX /2 cc NS)  Left pectoralis major 100 units Left latissimus dorsi100 units Left brachialis 50 units Left pronator teres 50 units Left flexor digitorum profundus 50 units Left opponens 50 units Left levator scapular 25 units Right levator scapula 25 units   155 units was injected according to migraine protocol   DIAGNOSTIC DATA (LABS, IMAGING, TESTING) - I reviewed patient records, labs, notes, testing and imaging myself where available.   Jordan HISTORY:  Jordan Rivas is on 63 years old African American female, here to follow-up for spastic left hemiparesis following right basal ganglion hemorrhagic stroke at age 18 in 2003, complex partial seizure, chronic migraine headaches,   She had a history of right hemisphere hemorrhagic stroke due to brain aneurysm at age 47, was residual severe left spastic hemiparesis, left visual field cut, profound gait difficulty,   She presented with seizure on Jan 26 2013, she woke up in the middle of the night using bathroom, while walking towards the bathroom, she fell, with witnessed seizure-like activity, tongue biting, woke up confused, second episode of seizure was on  June 19, 2017, fell out of the bed for unknown reasons, now taking Keppra 750 twice a day,   MRI of the brain demonstrate extensive encephalomalacia involving right basal ganglion, perisylvian fissure region, no acute lesions,   She has been receiving Botox injection for spastic left hemiparesis since 2015,    She had long history of chronic migraine headaches, complains of increased headache over the past few months, previously her headache responding well to it aimovig as preventive medication, Bernita Raisin as needed, but gradually lost the benefit since beginning of 2023, she complains of severe right parietal occipital area headache with light noise sensitivity, nauseous, surrounding, lasting for hours, can happen couple times each week, she also complains of worsening functional status, sitting wheelchair most of the time,    Update July/31/2024: Last injection for spastic left upper extremity was in August 2022, lost follow-up due to transportation issues   She complains of increased left shoulder, elbow pain missing injection, last injection was in June 2023, tolerating Keppra 750 twice a day, has no recurrent seizure  UPDATE December 12 2023: Every 3 months injection did help her spastic left upper extremity especially left shoulder pain, has better range of motion,  She continue complains of headache, daily, wake up with headache and goes to bed with headache moderate, tends to stay on the right frontal occipital region   PHYSICAL EXAM:   Vitals:   12/12/23 1009  BP: (!) 146/84   There is no height or weight on file to calculate BMI.  PHYSICAL EXAMNIATION:  Gen:  NAD, conversant, well nourised, well groomed                     Cardiovascular: Regular rate rhythm, no peripheral edema, warm, nontender. Eyes: Conjunctivae clear without exudates or hemorrhage Neck: Supple, no carotid bruits. Pulmonary: Clear to auscultation bilaterally   NEUROLOGICAL EXAM:  MENTAL  STATUS: Speech/cognition: Awake, alert, oriented to history taking and casual conversation CRANIAL NERVES: CN II: Visual fields are full to confrontation. Pupils are round equal and briskly reactive to light. CN III, IV, VI: extraocular movement are normal. No ptosis. CN V: Facial sensation is intact to light touch CN VII: Face is symmetric with normal eye closure  CN VIII: Hearing is normal to causal conversation. CN IX, X: Phonation is normal. CN XI: Head turning and shoulder shrug are intact  MOTOR: Spastic left hemiparesis, no antigravity movement of left upper extremity, tendency for elbow pronation, finger flexion, complains of pain on passive extension of left shoulder  Wear left AFO, antigravity movement of left proximal lower extremity muscle  REFLEXES: Hyperreflexia of left upper and lower extremity muscles  SENSORY: Intact to light touch,  COORDINATION: There is no trunk or limb dysmetria noted.  GAIT/STANCE: Deferred  REVIEW OF SYSTEMS:  Full 14 system review of systems performed and notable only for as above All other review of systems were negative.   ALLERGIES: Allergies  Allergen Reactions   Celebrex [Celecoxib] Swelling and Other (See Comments)    "lips; it was terrible"   Baclofen Swelling   Aspirin    Zolpidem     HOME MEDICATIONS: Current Outpatient Medications  Medication Sig Dispense Refill   acetaminophen (TYLENOL) 325 MG tablet Take 650 mg by mouth every 6 (six) hours as needed for mild pain or headache.     Alcohol Swabs (ALCOHOL PREP) 70 % PADS      ALPRAZolam (XANAX) 1 MG tablet Take 1-2 tablets 30 minutes prior to MRI, may repeat once as needed. Must have driver. 3 tablet 0   amitriptyline (ELAVIL) 100 MG tablet Take 100 mg by mouth at bedtime.   0   AQUALANCE LANCETS 30G MISC      aspirin 81 MG chewable tablet Chew 1 tablet (81 mg total) by mouth daily. 30 tablet 3   atorvastatin (LIPITOR) 80 MG tablet Take 80 mg by mouth daily.      Azelastine HCl 0.15 % SOLN 1-2 sprays in each nostril     benzonatate (TESSALON) 100 MG capsule Take 1 capsule (100 mg total) by mouth 3 (three) times daily as needed for cough. 20 capsule 0   Blood Glucose Calibration (ONETOUCH VERIO) SOLN      botulinum toxin Type A (BOTOX) 100 units SOLR injection Inject 500 Units into the muscle every 3 (three) months. 5 each 3   Cholecalciferol (VITAMIN D) 1000 UNITS capsule Take 1,000 Units by mouth daily.     cyclobenzaprine (FEXMID) 7.5 MG tablet Take 7.5 mg by mouth every 8 (eight) hours as needed.     desoximetasone (TOPICORT) 0.05 % cream 1 application Twice a day Externally     DULoxetine (CYMBALTA) 60 MG capsule Take 60 mg by mouth daily.     esomeprazole (NEXIUM) 40 MG capsule Take 40 mg by mouth 2 (two) times daily.     glimepiride (AMARYL) 4 MG tablet Take 4 mg by mouth every morning.     ketoconazole (NIZORAL) 2 % cream Apply 1 application topically 2 (two) times daily as needed for irritation.  levETIRAcetam (KEPPRA) 750 MG tablet TAKE 1 TABLET(750 MG) BY MOUTH TWICE DAILY 180 tablet 3   lidocaine (XYLOCAINE) 5 % ointment Apply 1 application topically 4 (four) times daily as needed for moderate pain.      meloxicam (MOBIC) 15 MG tablet Take 15 mg by mouth daily.     methylPREDNISolone (MEDROL DOSEPAK) 4 MG TBPK tablet Taper pack as directed 1 each 0   metoprolol succinate (TOPROL-XL) 25 MG 24 hr tablet Take 1 tablet (25 mg total) by mouth daily. 30 tablet 3   ondansetron (ZOFRAN) 4 MG tablet TAKE 1 TABLET BY MOUTH EVERY 8 HOURS AS NEEDED FOR NAUSEA 20 tablet 5   ONETOUCH VERIO test strip      oxyCODONE (ROXICODONE) 15 MG immediate release tablet Take 15 mg by mouth 4 (four) times daily as needed for pain.      pregabalin (LYRICA) 75 MG capsule Take 1 capsule (75 mg total) by mouth 2 (two) times daily. TAKE 1 CAPSULE BY MOUTH THREE TIMES DAILY 60 capsule 1   tiZANidine (ZANAFLEX) 2 MG tablet TAKE 2 TABLETS(4 MG) BY MOUTH IN THE MORNING AND AT  BEDTIME (Patient not taking: Reported on 04/03/2023) 360 tablet 3   Ubrogepant (UBRELVY) 100 MG TABS Take 1 tablet (100 mg total) by mouth as directed. Every other day as needed 15 tablet 11   Ubrogepant (UBRELVY) 50 MG TABS TAKE 1 TABLET BY MOUTH AT ONSET OF MIGRAINE. MAY REPEAT IN 2 HOURS, AS NEEDED. MAX DOSE: 2 TABLETS DAILY. THIS IS A 30 DAY PRESCRIPTION 12 tablet 6   Current Facility-Administered Medications  Medication Dose Route Frequency Provider Last Rate Last Admin   incobotulinumtoxinA (XEOMIN) 100 units injection 600 Units  600 Units Intramuscular Q90 days Levert Feinstein, MD   600 Units at 09/19/23 1046   incobotulinumtoxinA (XEOMIN) 100 units injection 600 Units  600 Units Intramuscular Once Levert Feinstein, MD        PAST Jordan HISTORY: Past Jordan History:  Diagnosis Date   Chronic pain    "in this left arm and leg that don't move"   CVA (cerebrovascular accident) (HCC) 1995   "left side paralyzed"   Dental caries    Depression    Diabetes mellitus without complication (HCC)    GERD (gastroesophageal reflux disease)    Headache    Hypercholesteremia    Hypertension    Insomnia    Neuropathy    Overactive bladder    Peripheral vascular disease (HCC)    left arm and leg; since stroke 1995   Pneumonia    Seizures (HCC) 01/27/12   "first one ever"    PAST SURGICAL HISTORY: Past Surgical History:  Procedure Laterality Date   CEREBRAL ANEURYSM REPAIR  11/1993   "left my left side paralyzed"   DENTAL RESTORATION/EXTRACTION WITH X-RAY N/A 09/13/2017   Procedure: DENTAL RESTORATION/EXTRACTION;  Surgeon: Ocie Doyne, DDS;  Location: [facility];  Service: Oral Surgery;  Laterality: N/A;   ESOPHAGOGASTRODUODENOSCOPY N/A 03/28/2020   Procedure: ESOPHAGOGASTRODUODENOSCOPY (EGD);  Surgeon: Vida Rigger, MD;  Location: [facility];  Service: Endoscopy;  Laterality: N/A;  bedside endoscopy   TUBAL LIGATION  04/1984    FAMILY HISTORY: Family History  Problem Relation Age of Onset    Dementia Mother    Diabetes type II Mother    Diabetes Mother    Breast cancer Mother 24   Heart attack Father     SOCIAL HISTORY: Social History   Socioeconomic History   Marital status: Divorced  Spouse name: Not on file   Number of children: 2   Years of education: 12   Highest education level: High school graduate  Occupational History   Occupation: Disabled  Tobacco Use   Smoking status: Never   Smokeless tobacco: Never  Vaping Use   Vaping status: Never Used  Substance and Sexual Activity   Alcohol use: No   Drug use: No   Sexual activity: Yes  Other Topics Concern   Not on file  Social History Narrative   Lives at home with her grandson.   Right-handed.   No daily caffeine use.   Social Drivers of Corporate investment banker Strain: Not on file  Food Insecurity: Not on file  Transportation Needs: Not on file  Physical Activity: Not on file  Stress: Not on file  Social Connections: Not on file  Intimate Partner Violence: Not on file      Levert Feinstein, M.D. Ph.D.  [facility] [address] [place], [place] [address] Ph: [phone] Fax: [phone]  CC:  Hillery Aldo, NP [address] [place],  [place] [address]  Hillery Aldo, NP

## 2023-12-12 NOTE — Progress Notes (Signed)
 xeomin 600 units x 6 vial  Ndc-0023-1145-01 [id number] X 5 Exp-01/2026 Lot : [id number] Exp: 12/2025 B/B x 6 Bacteriostatic 0.9% Sodium Chloride- 12 mL  TFT:[id number] Expiration: 07/04/2024 NDC: 5427-0623-76 Dx: E83.151 WITNESSED VO:[id number] F CMA

## 2023-12-16 ENCOUNTER — Other Ambulatory Visit: Payer: Self-pay | Admitting: Neurology

## 2023-12-30 ENCOUNTER — Telehealth: Payer: Self-pay | Admitting: Neurology

## 2023-12-30 MED ORDER — UBRELVY 100 MG PO TABS: 100.0000 mg | ORAL_TABLET | ORAL | 11 refills | Status: DC

## 2023-12-30 NOTE — Telephone Encounter (Signed)
 Ubrelvy  prescription has been sent to [facility] as requested by patient.

## 2023-12-30 NOTE — Telephone Encounter (Signed)
 Pt has called to report that re: her Ubrogepant  (UBRELVY ) 100 MG TABS [facility] does not have it in stock so she is asking that it be called into [facility] [phone]

## 2024-01-17 ENCOUNTER — Ambulatory Visit: Admitting: Podiatry

## 2024-01-28 ENCOUNTER — Telehealth: Payer: Self-pay | Admitting: Neurology

## 2024-01-28 MED ORDER — UBRELVY 100 MG PO TABS: 100.0000 mg | ORAL_TABLET | ORAL | 11 refills | Status: AC

## 2024-01-28 NOTE — Telephone Encounter (Signed)
 Pt called stating that she is needing a refill on her Ubrogepant  (UBRELVY ) 100 MG TABS but [facility] does not have it and she would like to know if the refill request can be sent to the [facility] on [address].

## 2024-01-28 NOTE — Telephone Encounter (Signed)
 Refilled as requested

## 2024-02-07 ENCOUNTER — Ambulatory Visit: Admitting: Nurse Practitioner

## 2024-02-25 DIAGNOSIS — E109 Type 1 diabetes mellitus without complications: Secondary | ICD-10-CM | POA: Diagnosis not present

## 2024-02-25 DIAGNOSIS — Z9181 History of falling: Secondary | ICD-10-CM | POA: Diagnosis not present

## 2024-02-25 DIAGNOSIS — R7401 Elevation of levels of liver transaminase levels: Secondary | ICD-10-CM | POA: Diagnosis not present

## 2024-02-25 DIAGNOSIS — M545 Low back pain, unspecified: Secondary | ICD-10-CM | POA: Diagnosis not present

## 2024-02-25 DIAGNOSIS — R030 Elevated blood-pressure reading, without diagnosis of hypertension: Secondary | ICD-10-CM | POA: Diagnosis not present

## 2024-02-25 DIAGNOSIS — I639 Cerebral infarction, unspecified: Secondary | ICD-10-CM | POA: Diagnosis not present

## 2024-02-25 DIAGNOSIS — E559 Vitamin D deficiency, unspecified: Secondary | ICD-10-CM | POA: Diagnosis not present

## 2024-02-25 DIAGNOSIS — Z79899 Other long term (current) drug therapy: Secondary | ICD-10-CM | POA: Diagnosis not present

## 2024-02-27 DIAGNOSIS — Z79899 Other long term (current) drug therapy: Secondary | ICD-10-CM | POA: Diagnosis not present

## 2024-03-02 DIAGNOSIS — R7401 Elevation of levels of liver transaminase levels: Secondary | ICD-10-CM | POA: Diagnosis not present

## 2024-03-17 DIAGNOSIS — A499 Bacterial infection, unspecified: Secondary | ICD-10-CM | POA: Diagnosis not present

## 2024-03-17 DIAGNOSIS — N390 Urinary tract infection, site not specified: Secondary | ICD-10-CM | POA: Diagnosis not present

## 2024-03-21 ENCOUNTER — Other Ambulatory Visit: Payer: Self-pay | Admitting: Neurology

## 2024-03-26 ENCOUNTER — Telehealth: Payer: Self-pay | Admitting: Neurology

## 2024-03-27 ENCOUNTER — Ambulatory Visit: Admitting: Podiatry

## 2024-03-27 DIAGNOSIS — K760 Fatty (change of) liver, not elsewhere classified: Secondary | ICD-10-CM | POA: Diagnosis not present

## 2024-03-27 DIAGNOSIS — I69359 Hemiplegia and hemiparesis following cerebral infarction affecting unspecified side: Secondary | ICD-10-CM | POA: Diagnosis not present

## 2024-03-27 DIAGNOSIS — M545 Low back pain, unspecified: Secondary | ICD-10-CM | POA: Diagnosis not present

## 2024-03-27 DIAGNOSIS — E109 Type 1 diabetes mellitus without complications: Secondary | ICD-10-CM | POA: Diagnosis not present

## 2024-03-27 DIAGNOSIS — E559 Vitamin D deficiency, unspecified: Secondary | ICD-10-CM | POA: Diagnosis not present

## 2024-03-27 DIAGNOSIS — I10 Essential (primary) hypertension: Secondary | ICD-10-CM | POA: Diagnosis not present

## 2024-03-27 DIAGNOSIS — I639 Cerebral infarction, unspecified: Secondary | ICD-10-CM | POA: Diagnosis not present

## 2024-03-27 DIAGNOSIS — R030 Elevated blood-pressure reading, without diagnosis of hypertension: Secondary | ICD-10-CM | POA: Diagnosis not present

## 2024-03-27 DIAGNOSIS — R7401 Elevation of levels of liver transaminase levels: Secondary | ICD-10-CM | POA: Diagnosis not present

## 2024-03-27 DIAGNOSIS — Z79899 Other long term (current) drug therapy: Secondary | ICD-10-CM | POA: Diagnosis not present

## 2024-03-30 MED ORDER — LEVETIRACETAM 750 MG PO TABS: 750.0000 mg | ORAL_TABLET | Freq: Two times a day (BID) | ORAL | 0 refills | Status: DC

## 2024-03-30 NOTE — Addendum Note (Signed)
 Addended by: ONEITA NEVELYN BRAVO on: 03/30/2024 01:47 PM   Modules accepted: Orders

## 2024-03-30 NOTE — Telephone Encounter (Signed)
 Karleen from [facility] called to follow up on Pt medication , Pharmacy is asking why was medication denied .    levETIRAcetam  (KEPPRA ) 750 MG tablet  Pharmacy  [id number] - [place],  - [address] AT [address] (Ph: [phone])

## 2024-04-01 DIAGNOSIS — Z79899 Other long term (current) drug therapy: Secondary | ICD-10-CM | POA: Diagnosis not present

## 2024-04-13 ENCOUNTER — Telehealth: Payer: Self-pay | Admitting: Neurology

## 2024-04-13 NOTE — Telephone Encounter (Signed)
 Patient left a voice mail asking to schedule a botox  appointment. I checked with Angie and she said it is ok to schedule her. The call back number she left on the voice mail was [phone].

## 2024-04-13 NOTE — Telephone Encounter (Signed)
 Spoke with patient and scheduled Botox  appointment with Dr. Onita for 04/29/24 at 4pm

## 2024-04-15 ENCOUNTER — Ambulatory Visit: Admitting: Podiatry

## 2024-04-29 ENCOUNTER — Encounter: Payer: Self-pay | Admitting: Neurology

## 2024-04-29 ENCOUNTER — Ambulatory Visit: Admitting: Neurology

## 2024-04-29 VITALS — BP 108/74 | HR 87 | Ht 61.0 in | Wt 184.0 lb

## 2024-04-29 DIAGNOSIS — I69354 Hemiplegia and hemiparesis following cerebral infarction affecting left non-dominant side: Secondary | ICD-10-CM

## 2024-04-29 DIAGNOSIS — G43709 Chronic migraine without aura, not intractable, without status migrainosus: Secondary | ICD-10-CM | POA: Diagnosis not present

## 2024-04-29 MED ORDER — CYCLOBENZAPRINE HCL 7.5 MG PO TABS: 7.5000 mg | ORAL_TABLET | Freq: Every day | ORAL | 3 refills | Status: DC | PRN

## 2024-04-29 MED ORDER — ONABOTULINUMTOXINA 200 UNITS IJ SOLR
600.0000 [IU] | Freq: Once | INTRAMUSCULAR | Status: AC
  Administered 2024-05-01: 600 [IU] via INTRAMUSCULAR

## 2024-04-29 NOTE — Progress Notes (Unsigned)
 Chief Complaint  Patient presents with   Botulinum Toxin Injection   Injections    rm14      ASSESSMENT AND PLAN  Jordan Rivas is a 63 y.o. female   Spastic left  hemisphere following hemorrhagic stroke, at age 13, in 2003 CT head showed spastic left hemiparesis due to right basal ganglia and right frontal parietal region infarction She responded very well to previous electrical stimulation guided botulism toxin injection for spastic left upper extremity  Complex partial seizure             Doing well on Keppra  750 mg twice daily  Chronic migraine headaches  Frequent daily headache taking Ubrelvy  as needed,   Electrical stimulation guided Botox  a injection for spastic left upper extremity, used 600 units (100 units BOTOX  /2 cc NS)  Left pectoralis major 100 units Left latissimus dorsi100 units Left brachialis 50 units Left pronator teres 50 units Left flexor digitorum profundus 50 units Left opponens 50 units Left levator scapular 25 units Right levator scapula 25 units   155 units was injected according to migraine protocol   DIAGNOSTIC DATA (LABS, IMAGING, TESTING) - I reviewed patient records, labs, notes, testing and imaging myself where available.   MEDICAL HISTORY:  Jordan Rivas is on 63 years old African American female, here to follow-up for spastic left hemiparesis following right basal ganglion hemorrhagic stroke at age 65 in 2003, complex partial seizure, chronic migraine headaches,   She had a history of right hemisphere hemorrhagic stroke due to brain aneurysm at age 6, was residual severe left spastic hemiparesis, left visual field cut, profound gait difficulty,   She presented with seizure on Jan 26 2013, she woke up in the middle of the night using bathroom, while walking towards the bathroom, she fell, with witnessed seizure-like activity, tongue biting, woke up confused, second episode of seizure was on June 19, 2017, fell out of the bed for  unknown reasons, now taking Keppra  750 twice a day,   MRI of the brain demonstrate extensive encephalomalacia involving right basal ganglion, perisylvian fissure region, no acute lesions,   She has been receiving Botox  injection for spastic left hemiparesis since 2015,    She had long history of chronic migraine headaches, complains of increased headache over the past few months, previously her headache responding well to it aimovig  as preventive medication, Ubrelvy  as needed, but gradually lost the benefit since beginning of 2023, she complains of severe right parietal occipital area headache with light noise sensitivity, nauseous, surrounding, lasting for hours, can happen couple times each week, she also complains of worsening functional status, sitting wheelchair most of the time,    Update July/31/2024: Last injection for spastic left upper extremity was in August 2022, lost follow-up due to transportation issues   She complains of increased left shoulder, elbow pain missing injection, last injection was in June 2023, tolerating Keppra  750 twice a day, has no recurrent seizure  UPDATE December 12 2023: Every 3 months injection did help her spastic left upper extremity especially left shoulder pain, has better range of motion,  She continue complains of headache, daily, wake up with headache and goes to bed with headache moderate, tends to stay on the right frontal occipital region   PHYSICAL EXAM:   Vitals:   04/29/24 1559  BP: 108/74  Pulse: 87  SpO2: 96%  Weight: 184 lb (83.5 kg)  Height: 5' 1 (1.549 m)   Body mass index is 34.77 kg/m.  PHYSICAL EXAMNIATION:  Gen: NAD, conversant, well nourised, well groomed                     Cardiovascular: Regular rate rhythm, no peripheral edema, warm, nontender. Eyes: Conjunctivae clear without exudates or hemorrhage Neck: Supple, no carotid bruits. Pulmonary: Clear to auscultation bilaterally   NEUROLOGICAL EXAM:  MENTAL  STATUS: Speech/cognition: Awake, alert, oriented to history taking and casual conversation CRANIAL NERVES: CN II: Visual fields are full to confrontation. Pupils are round equal and briskly reactive to light. CN III, IV, VI: extraocular movement are normal. No ptosis. CN V: Facial sensation is intact to light touch CN VII: Face is symmetric with normal eye closure  CN VIII: Hearing is normal to causal conversation. CN IX, X: Phonation is normal. CN XI: Head turning and shoulder shrug are intact  MOTOR: Spastic left hemiparesis, no antigravity movement of left upper extremity, tendency for elbow pronation, finger flexion, complains of pain on passive extension of left shoulder  Wear left AFO, antigravity movement of left proximal lower extremity muscle  REFLEXES: Hyperreflexia of left upper and lower extremity muscles  SENSORY: Intact to light touch,  COORDINATION: There is no trunk or limb dysmetria noted.  GAIT/STANCE: Deferred  REVIEW OF SYSTEMS:  Full 14 system review of systems performed and notable only for as above All other review of systems were negative.   ALLERGIES: Allergies  Allergen Reactions   Celebrex [Celecoxib] Swelling and Other (See Comments)    lips; it was terrible   Baclofen Swelling   Aspirin     Zolpidem      HOME MEDICATIONS: Current Outpatient Medications  Medication Sig Dispense Refill   acetaminophen  (TYLENOL ) 325 MG tablet Take 650 mg by mouth every 6 (six) hours as needed for mild pain or headache.     Alcohol Swabs (ALCOHOL PREP) 70 % PADS      amitriptyline  (ELAVIL ) 100 MG tablet Take 100 mg by mouth at bedtime.   0   AQUALANCE LANCETS 30G MISC      atorvastatin  (LIPITOR) 80 MG tablet Take 80 mg by mouth daily.     Azelastine HCl 0.15 % SOLN 1-2 sprays in each nostril     benzonatate  (TESSALON ) 100 MG capsule Take 1 capsule (100 mg total) by mouth 3 (three) times daily as needed for cough. 20 capsule 0   Blood Glucose Calibration  (ONETOUCH VERIO) SOLN      botulinum toxin Type A  (BOTOX ) 100 units SOLR injection Inject 500 Units into the muscle every 3 (three) months. 5 each 3   Cholecalciferol (VITAMIN D) 1000 UNITS capsule Take 1,000 Units by mouth daily.     cyclobenzaprine  (FEXMID ) 7.5 MG tablet Take 7.5 mg by mouth every 8 (eight) hours as needed.     desoximetasone (TOPICORT) 0.05 % cream 1 application Twice a day Externally     DULoxetine  (CYMBALTA ) 60 MG capsule Take 60 mg by mouth daily.     esomeprazole (NEXIUM) 40 MG capsule Take 40 mg by mouth 2 (two) times daily.     glimepiride (AMARYL) 4 MG tablet Take 4 mg by mouth every morning.     ketoconazole (NIZORAL) 2 % cream Apply 1 application topically 2 (two) times daily as needed for irritation.     levETIRAcetam  (KEPPRA ) 750 MG tablet Take 1 tablet (750 mg total) by mouth 2 (two) times daily. 180 tablet 0   lidocaine  (XYLOCAINE ) 5 % ointment Apply 1 application topically 4 (four) times daily as needed  for moderate pain.      metoprolol  succinate (TOPROL -XL) 25 MG 24 hr tablet Take 1 tablet (25 mg total) by mouth daily. 30 tablet 3   ondansetron  (ZOFRAN ) 4 MG tablet TAKE 1 TABLET BY MOUTH EVERY 8 HOURS AS NEEDED FOR NAUSEA 20 tablet 5   ONETOUCH VERIO test strip      oxyCODONE  (ROXICODONE ) 15 MG immediate release tablet Take 15 mg by mouth 4 (four) times daily as needed for pain.      pregabalin  (LYRICA ) 75 MG capsule Take 1 capsule (75 mg total) by mouth 2 (two) times daily. TAKE 1 CAPSULE BY MOUTH THREE TIMES DAILY 60 capsule 1   Ubrogepant  (UBRELVY ) 100 MG TABS Take 1 tablet (100 mg total) by mouth as directed. Every other day as needed 15 tablet 11   ALPRAZolam  (XANAX ) 1 MG tablet Take 1-2 tablets 30 minutes prior to MRI, may repeat once as needed. Must have driver. (Patient not taking: Reported on 04/29/2024) 3 tablet 0   aspirin  81 MG chewable tablet Chew 1 tablet (81 mg total) by mouth daily. (Patient not taking: Reported on 04/29/2024) 30 tablet 3    meloxicam (MOBIC) 15 MG tablet Take 15 mg by mouth daily. (Patient not taking: Reported on 04/29/2024)     methylPREDNISolone  (MEDROL  DOSEPAK) 4 MG TBPK tablet Taper pack as directed (Patient not taking: Reported on 04/29/2024) 1 each 0   tiZANidine  (ZANAFLEX ) 2 MG tablet TAKE 2 TABLETS(4 MG) BY MOUTH IN THE MORNING AND AT BEDTIME (Patient not taking: Reported on 04/29/2024) 360 tablet 3   Current Facility-Administered Medications  Medication Dose Route Frequency Provider Last Rate Last Admin   botulinum toxin Type A  (BOTOX ) injection 600 Units  600 Units Intramuscular Once Onita Duos, MD       incobotulinumtoxinA  (XEOMIN ) 100 units injection 600 Units  600 Units Intramuscular Q90 days Onita Duos, MD   600 Units at 09/19/23 1046    PAST MEDICAL HISTORY: Past Medical History:  Diagnosis Date   Chronic pain    in this left arm and leg that don't move   CVA (cerebrovascular accident) (HCC) 1995   left side paralyzed   Dental caries    Depression    Diabetes mellitus without complication (HCC)    GERD (gastroesophageal reflux disease)    Headache    Hypercholesteremia    Hypertension    Insomnia    Neuropathy    Overactive bladder    Peripheral vascular disease (HCC)    left arm and leg; since stroke 1995   Pneumonia    Seizures (HCC) 01/27/12   first one ever    PAST SURGICAL HISTORY: Past Surgical History:  Procedure Laterality Date   CEREBRAL ANEURYSM REPAIR  11/1993   left my left side paralyzed   DENTAL RESTORATION/EXTRACTION WITH X-RAY N/A 09/13/2017   Procedure: DENTAL RESTORATION/EXTRACTION;  Surgeon: Sheryle Hamilton, DDS;  Location: [facility] OR;  Service: Oral Surgery;  Laterality: N/A;   ESOPHAGOGASTRODUODENOSCOPY N/A 03/28/2020   Procedure: ESOPHAGOGASTRODUODENOSCOPY (EGD);  Surgeon: Rosalie Kitchens, MD;  Location: [facility];  Service: Endoscopy;  Laterality: N/A;  bedside endoscopy   TUBAL LIGATION  04/1984    FAMILY HISTORY: Family History  Problem Relation Age of  Onset   Dementia Mother    Diabetes type II Mother    Diabetes Mother    Breast cancer Mother 35   Heart attack Father     SOCIAL HISTORY: Social History   Socioeconomic History   Marital status: Divorced  Spouse name: Not on file   Number of children: 2   Years of education: 12   Highest education level: High school graduate  Occupational History   Occupation: Disabled  Tobacco Use   Smoking status: Never   Smokeless tobacco: Never  Vaping Use   Vaping status: Never Used  Substance and Sexual Activity   Alcohol use: No   Drug use: No   Sexual activity: Yes  Other Topics Concern   Not on file  Social History Narrative   Lives at home with her grandson.   Right-handed.   No daily caffeine  use.   Social Drivers of Corporate investment banker Strain: Not on file  Food Insecurity: Not on file  Transportation Needs: Not on file  Physical Activity: Not on file  Stress: Not on file  Social Connections: Not on file  Intimate Partner Violence: Not on file      Modena Callander, M.D. Ph.D.  [facility] [address], [address] [place], [place] [address] Ph: [phone] Fax: [phone]  CC:  Campbell Reynolds, NP [address] [place],  [place] [address]  Campbell Reynolds, NP

## 2024-04-29 NOTE — Progress Notes (Unsigned)
 Botox - 200 units x 3vial Lot: [id number] Expiration: 2027/11 NDC: 0023-3921-02  Bacteriostatic 0.9% Sodium Chloride - 12 mL  Lot: [id number] Expiration: 07/03/25 NDC: 9590803397  Dx: P30.645 .G43.709  B/B Witnessed by DELENA MOLT RN

## 2024-05-01 DIAGNOSIS — G43709 Chronic migraine without aura, not intractable, without status migrainosus: Secondary | ICD-10-CM

## 2024-05-08 ENCOUNTER — Ambulatory Visit: Admitting: Podiatry

## 2024-05-08 DIAGNOSIS — M79674 Pain in right toe(s): Secondary | ICD-10-CM | POA: Diagnosis not present

## 2024-05-08 DIAGNOSIS — E1149 Type 2 diabetes mellitus with other diabetic neurological complication: Secondary | ICD-10-CM | POA: Diagnosis not present

## 2024-05-08 DIAGNOSIS — M79675 Pain in left toe(s): Secondary | ICD-10-CM | POA: Diagnosis not present

## 2024-05-08 DIAGNOSIS — M2042 Other hammer toe(s) (acquired), left foot: Secondary | ICD-10-CM

## 2024-05-08 DIAGNOSIS — M2041 Other hammer toe(s) (acquired), right foot: Secondary | ICD-10-CM

## 2024-05-08 DIAGNOSIS — B351 Tinea unguium: Secondary | ICD-10-CM | POA: Diagnosis not present

## 2024-05-08 NOTE — Progress Notes (Signed)
  Subjective:  Patient ID: Jordan Rivas, female    DOB: February 04, 1961,  MRN: [id number]  Chief Complaint  Patient presents with   Nail Problem    Nail trim    63 y.o. female returns for the above complaint.  Patient presents with thickened and again dystrophic mycotic toenails x 10 mild pain on palpation worse with ambulation is with pressure she would also like to discuss diabetic shoes.  She states the previous pair getting worn out she would like to do another.  Denies any other acute issues.  Objective:  There were no vitals filed for this visit. Podiatric Exam: Vascular: dorsalis pedis and posterior tibial pulses are palpable bilateral. Capillary return is immediate. Temperature gradient is WNL. Skin turgor WNL  Sensorium: Normal Semmes Weinstein monofilament test. Normal tactile sensation bilaterally. Nail Exam: Pt has thick disfigured discolored nails with subungual debris noted bilateral entire nail hallux through fifth toenails.  Pain on palpation to the nails. Ulcer Exam: There is no evidence of ulcer or pre-ulcerative changes or infection. Orthopedic Exam: Muscle tone and strength are WNL. No limitations in general ROM. No crepitus or effusions noted.  Skin: No Porokeratosis. No infection or ulcers.  Semiflexible hammertoe deformity noted bilaterally    Assessment & Plan:   1. Type II diabetes mellitus with neurological manifestations (HCC)   2. Hammertoes of both feet     Patient was evaluated and treated and all questions answered.  Hammertoe bilateral with underlying diabetes - Given that patient has contracture of hammertoe deformity bilaterally with the setting of diabetes she is at high risk for developing ulcerations would benefit from diabetic shoes should be scheduled with diabetic shoes prescription was given.  Onychomycosis with pain  -Nails palliatively debrided as below. -Educated on self-care  Procedure: Nail Debridement Rationale: pain  Type of  Debridement: manual, sharp debridement. Instrumentation: Nail nipper, rotary burr. Number of Nails: 10  Procedures and Treatment: Consent by patient was obtained for treatment procedures. The patient understood the discussion of treatment and procedures well. All questions were answered thoroughly reviewed. Debridement of mycotic and hypertrophic toenails, 1 through 5 bilateral and clearing of subungual debris. No ulceration, no infection noted.  Return Visit-Office Procedure: Patient instructed to return to the office for a follow up visit 3 months for continued evaluation and treatment.  Franky Blanch, DPM    No follow-ups on file.

## 2024-06-23 ENCOUNTER — Other Ambulatory Visit: Payer: Self-pay | Admitting: *Deleted

## 2024-06-23 MED ORDER — LEVETIRACETAM 750 MG PO TABS: 750.0000 mg | ORAL_TABLET | Freq: Two times a day (BID) | ORAL | 0 refills | Status: AC

## 2024-07-15 ENCOUNTER — Telehealth: Payer: Self-pay | Admitting: Neurology

## 2024-07-15 NOTE — Telephone Encounter (Signed)
 Dr. Onita- what do you recommend about dose for cyclobenzaprine  and are you willing to call in refill? She has balance from previous botox  injection and unable to schedule another appt until she pays towards this. We can discuss with pt about this but wanted to see what you recommended first about cyclobenzaprine .   Last saw Dr. Onita 04/29/24. Sent in rx below.  Received botox  that day for: Electrical stimulation guided Botox  a injection for spastic left upper extremity, used 600 units (100 units BOTOX  /2 cc NS)   Has no appt scheduled as of right now. Checked with billing/Gaynel. She states: No bad debt, but has a botox  balance 765.60 from 06/2023

## 2024-07-15 NOTE — Telephone Encounter (Signed)
 Patient increase the dosage to 2 tablets a day due to having more spasms in right arm. Also may need to schedule another Botox  appointment  Patient request refill for cyclobenzaprine  (FEXMID ) 7.5 MG tablet send to  [id number]

## 2024-07-16 MED ORDER — CYCLOBENZAPRINE HCL 7.5 MG PO TABS: 7.5000 mg | ORAL_TABLET | Freq: Two times a day (BID) | ORAL | 3 refills | Status: AC | PRN

## 2024-07-16 NOTE — Telephone Encounter (Signed)
 She is on polypharmacy already, 30 years history of spastic left hemiparesis,  I would suggest not take more than Flexeril  7.5 mg twice a day, unless she reported significant improvement with that, but do have risk of polypharmacy side effect  Meds ordered this encounter  Medications   cyclobenzaprine  (FEXMID ) 7.5 MG tablet    Sig: Take 1 tablet (7.5 mg total) by mouth 2 (two) times daily as needed.    Dispense:  180 tablet    Refill:  3

## 2024-07-16 NOTE — Telephone Encounter (Signed)
 Call to patient, advised of Dr. Onita recommendations and to call office to pay on Botox  balance and then to schedule when ready. Patient verbalized understanding

## 2024-08-11 ENCOUNTER — Encounter: Payer: Self-pay | Admitting: Physician Assistant

## 2024-09-09 ENCOUNTER — Ambulatory Visit: Admitting: Podiatry

## 2024-09-09 DIAGNOSIS — M79674 Pain in right toe(s): Secondary | ICD-10-CM

## 2024-09-09 DIAGNOSIS — M79675 Pain in left toe(s): Secondary | ICD-10-CM | POA: Diagnosis not present

## 2024-09-09 DIAGNOSIS — B351 Tinea unguium: Secondary | ICD-10-CM | POA: Diagnosis not present

## 2024-09-09 DIAGNOSIS — E1149 Type 2 diabetes mellitus with other diabetic neurological complication: Secondary | ICD-10-CM

## 2024-09-09 NOTE — Progress Notes (Signed)
"  °  Subjective:  Patient ID: Jordan Rivas, female    DOB: 12/21/1960,  MRN: [id number]  Chief Complaint  Patient presents with   Nail Problem   64 y.o. female returns for the above complaint.  Patient presents with thickened and again dystrophic mycotic toenails x 10 mild pain on palpation worse with ambulation is with pressure she would also like to discuss diabetic shoes.  She states the previous pair getting worn out she would like to do another.  Denies any other acute issues.  Objective:  There were no vitals filed for this visit. Podiatric Exam: Vascular: dorsalis pedis and posterior tibial pulses are palpable bilateral. Capillary return is immediate. Temperature gradient is WNL. Skin turgor WNL  Sensorium: Normal Semmes Weinstein monofilament test. Normal tactile sensation bilaterally. Nail Exam: Pt has thick disfigured discolored nails with subungual debris noted bilateral entire nail hallux through fifth toenails.  Pain on palpation to the nails. Ulcer Exam: There is no evidence of ulcer or pre-ulcerative changes or infection. Orthopedic Exam: Muscle tone and strength are WNL. No limitations in general ROM. No crepitus or effusions noted.  Skin: No Porokeratosis. No infection or ulcers.  Semiflexible hammertoe deformity noted bilaterally    Assessment & Plan:   1. Pain due to onychomycosis of toenails of both feet   2. Type II diabetes mellitus with neurological manifestations [facility])      Patient was evaluated and treated and all questions answered.  Hammertoe bilateral with underlying diabetes - Given that patient has contracture of hammertoe deformity bilaterally with the setting of diabetes she is at high risk for developing ulcerations would benefit from diabetic shoes should be scheduled with diabetic shoes prescription was given.  Onychomycosis with pain  -Nails palliatively debrided as below. -Educated on self-care  Procedure: Nail Debridement Rationale: pain  Type  of Debridement: manual, sharp debridement. Instrumentation: Nail nipper, rotary burr. Number of Nails: 10  Procedures and Treatment: Consent by patient was obtained for treatment procedures. The patient understood the discussion of treatment and procedures well. All questions were answered thoroughly reviewed. Debridement of mycotic and hypertrophic toenails, 1 through 5 bilateral and clearing of subungual debris. No ulceration, no infection noted.  Return Visit-Office Procedure: Patient instructed to return to the office for a follow up visit 3 months for continued evaluation and treatment.  Franky Blanch, DPM    No follow-ups on file.  "

## 2024-09-17 NOTE — Progress Notes (Signed)
 "     Jordan Console, PA-C [address] [place], [place]  [address] Phone: [phone]   Gastroenterology Consultation  Referring Provider:     Campbell Reynolds, NP Primary Care Physician:  Patient, No Pcp Per Primary Gastroenterologist:  Jordan Console, PA-C / Glendia Holt, MD  Reason for Consultation:     Dysphagia        HPI:   Discussed the use of AI scribe software for clinical note transcription with the patient, who gave verbal consent to proceed.  Jordan Rivas is a 64 year old female who presents with persistent dysphagia.  New patient.  She has history of CVA with left side hemiparesis, PVD, history of seizures controlled on Keppra .  She is here today with a friend.  03/2020 last EGD by Dr. Rosalie (for hematemesis) in hospital: Small hiatal hernia.  Mild gastritis.  Medium amount of food in the stomach.  Duodenum normal.  No biopsies. History of Present Illness Dysphagia: - Persistent for approximately one year - Food frequently gets stuck in the chest and throat - Episodes of choking when food will not go down - Occasionally requires manual removal of food from the throat - No vomiting of food - Upper endoscopy performed in 2021  Gastroesophageal Reflux Symptoms: - Mild heartburn symptoms - Partial improvement with Nexium 40 mg twice daily - No significant acid reflux symptoms currently present  Chronic Constipation: - Bowel movements occur every other day - Requires Dulcolax as needed for relief - Constipation attributed to pain medication use - No prior use of prescription agents such as Amitiza  or Linzess - Previously recommended product discontinued due to poor palatability - No abdominal pain     Past Medical History:  Diagnosis Date   Chronic pain    in this left arm and leg that don't move   CVA (cerebrovascular accident) (HCC) 1995   left side paralyzed   Dental caries    Depression    Diabetes mellitus without complication (HCC)    GERD  (gastroesophageal reflux disease)    Headache    Hypercholesteremia    Hypertension    Insomnia    Neuropathy    Overactive bladder    Peripheral vascular disease    left arm and leg; since stroke 1995   Pneumonia    Seizures (HCC) 01/27/12   first one ever    Past Surgical History:  Procedure Laterality Date   CEREBRAL ANEURYSM REPAIR  11/1993   left my left side paralyzed   DENTAL RESTORATION/EXTRACTION WITH X-RAY N/A 09/13/2017   Procedure: DENTAL RESTORATION/EXTRACTION;  Surgeon: Sheryle Glendia, DDS;  Location: [facility] OR;  Service: Oral Surgery;  Laterality: N/A;   ESOPHAGOGASTRODUODENOSCOPY N/A 03/28/2020   Procedure: ESOPHAGOGASTRODUODENOSCOPY (EGD);  Surgeon: Rosalie Kitchens, MD;  Location: [facility];  Service: Endoscopy;  Laterality: N/A;  bedside endoscopy   TUBAL LIGATION  04/1984    Prior to Admission medications  Medication Sig Start Date End Date Taking? Authorizing Provider  acetaminophen  (TYLENOL ) 325 MG tablet Take 650 mg by mouth every 6 (six) hours as needed for mild pain or headache.   Yes [provider]  Alcohol Swabs (ALCOHOL PREP) 70 % PADS  10/30/17  Yes [provider]  amitriptyline  (ELAVIL ) 100 MG tablet Take 100 mg by mouth at bedtime.  02/17/15  Yes [provider]  AQUALANCE LANCETS 30G MISC  10/30/17  Yes [provider]  aspirin  81 MG chewable tablet Chew 1 tablet (81 mg total) by mouth  daily. 03/03/13  Yes Rai, Ripudeep K, MD  atorvastatin  (LIPITOR) 80 MG tablet Take 80 mg by mouth daily. 02/29/20  Yes [provider]  Azelastine HCl 0.15 % SOLN 1-2 sprays in each nostril   Yes [provider]  benzonatate  (TESSALON ) 100 MG capsule Take 1 capsule (100 mg total) by mouth 3 (three) times daily as needed for cough. 04/02/20  Yes Rai, Ripudeep K, MD  Blood Glucose Calibration (ONETOUCH VERIO) SOLN  10/30/17  Yes [provider]  botulinum toxin Type A  (BOTOX ) 100 units SOLR injection Inject 500 Units into  the muscle every 3 (three) months. 12/30/19  Yes Onita Duos, MD  Cholecalciferol (VITAMIN D) 1000 UNITS capsule Take 1,000 Units by mouth daily.   Yes [provider]  cyclobenzaprine  (FEXMID ) 7.5 MG tablet Take 1 tablet (7.5 mg total) by mouth 2 (two) times daily as needed. 07/16/24  Yes Onita Duos, MD  desoximetasone (TOPICORT) 0.05 % cream 1 application Twice a day Externally   Yes [provider]  DULoxetine  (CYMBALTA ) 60 MG capsule Take 60 mg by mouth daily.   Yes [provider]  esomeprazole (NEXIUM) 40 MG capsule Take 40 mg by mouth 2 (two) times daily.   Yes [provider]  glimepiride (AMARYL) 4 MG tablet Take 4 mg by mouth every morning. 12/12/20  Yes [provider]  ketoconazole (NIZORAL) 2 % cream Apply 1 application topically 2 (two) times daily as needed for irritation.   Yes [provider]  levETIRAcetam  (KEPPRA ) 750 MG tablet Take 1 tablet (750 mg total) by mouth 2 (two) times daily. 06/23/24  Yes Onita Duos, MD  lidocaine  (XYLOCAINE ) 5 % ointment Apply 1 application topically 4 (four) times daily as needed for moderate pain.  08/21/17  Yes [provider]  lubiprostone  (AMITIZA ) 24 MCG capsule Take 1 capsule (24 mcg total) by mouth 2 (two) times daily with a meal. 09/18/24 03/17/25 Yes [place], PA-C  methylPREDNISolone  (MEDROL  DOSEPAK) 4 MG TBPK tablet Taper pack as directed 08/13/22  Yes McCue, Harlene, NP  metoprolol  succinate (TOPROL -XL) 25 MG 24 hr tablet Take 1 tablet (25 mg total) by mouth daily. 03/03/13  Yes Rai, Ripudeep K, MD  ondansetron  (ZOFRAN ) 4 MG tablet TAKE 1 TABLET BY MOUTH EVERY 8 HOURS AS NEEDED FOR NAUSEA 11/13/21  Yes Onita Duos, MD  [facility] VERIO test strip  10/30/17  Yes [provider]  oxyCODONE  (ROXICODONE ) 15 MG immediate release tablet Take 15 mg by mouth 4 (four) times daily as needed for pain.    Yes [provider]  pregabalin  (LYRICA ) 75 MG capsule Take 1 capsule (75  mg total) by mouth 2 (two) times daily. TAKE 1 CAPSULE BY MOUTH THREE TIMES DAILY 04/02/20  Yes Rai, Ripudeep K, MD  Ubrogepant  (UBRELVY ) 100 MG TABS Take 1 tablet (100 mg total) by mouth as directed. Every other day as needed 01/28/24  Yes Onita Duos, MD  ALPRAZolam  (XANAX ) 1 MG tablet Take 1-2 tablets 30 minutes prior to MRI, may repeat once as needed. Must have driver. Patient not taking: Reported on 09/18/2024 02/08/22   Onita Duos, MD  meloxicam (MOBIC) 15 MG tablet Take 15 mg by mouth daily. Patient not taking: Reported on 09/18/2024 10/30/20   [provider]     Family History  Problem Relation Age of Onset   Dementia Mother    Diabetes type II Mother    Diabetes Mother    Breast cancer Mother 105   Heart attack  Father    Liver cancer Brother    Colon cancer Neg Hx    Esophageal cancer Neg Hx      Social History[1]  Allergies as of 09/18/2024 - Review Complete 09/18/2024  Allergen Reaction Noted   Celebrex [celecoxib] Swelling and Other (See Comments) 01/28/2012   Baclofen Swelling 03/02/2013   Aspirin   01/03/2018   Zolpidem   01/03/2018    Review of Systems:    All systems reviewed and negative except where noted in HPI.   Physical Exam:  BP (!) 140/80   Pulse 77   Ht 5' 1 (1.549 m)   Wt 200 lb (90.7 kg)   LMP 09/30/2011   BMI 37.79 kg/m  Patient's last menstrual period was 09/30/2011.  General:   Alert,  Well-developed, well-nourished, pleasant and cooperative in NAD.  Left-sided paralysis.  Requires assistance for walking. Lungs:  Respirations even and unlabored.  Clear throughout to auscultation.   No wheezes, crackles, or rhonchi. No acute distress. Heart:  Regular rate and rhythm; no murmurs, clicks, rubs, or gallops. Abdomen:  Normal bowel sounds.  No bruits.  Soft, and non-distended without masses, hepatosplenomegaly or hernias noted.  No Tenderness.  No guarding or rebound tenderness.    Neurologic:  Alert and oriented x3;  grossly normal  neurologically.  Left-sided paralysis.  Required assistance for walking.  Is able to get on and off exam table with my help. Psych:  Alert and cooperative. Normal mood and affect.   Imaging Studies: No results found.  Labs: CBC    Component Value Date/Time   WBC 8.9 02/08/2022 1534   WBC 12.9 (H) 04/02/2020 0336   RBC 4.83 02/08/2022 1534   RBC 4.47 04/02/2020 0336   HGB 12.0 02/08/2022 1534   HCT 37.8 02/08/2022 1534   PLT 278 02/08/2022 1534   MCV 78 (L) 02/08/2022 1534    CMP     Component Value Date/Time   NA 140 02/08/2022 1534   K 4.0 02/08/2022 1534   CL 102 02/08/2022 1534   CO2 26 02/08/2022 1534   GLUCOSE 173 (H) 02/08/2022 1534   GLUCOSE 102 (H) 04/02/2020 0336   BUN 10 02/08/2022 1534   CREATININE 0.73 02/08/2022 1534   CALCIUM  8.9 02/08/2022 1534   PROT 6.9 02/08/2022 1534   ALBUMIN 3.9 02/08/2022 1534   AST 22 02/08/2022 1534   ALT 20 02/08/2022 1534   ALKPHOS 155 (H) 02/08/2022 1534   BILITOT <0.2 02/08/2022 1534   GFRNONAA >60 04/02/2020 0336   GFRAA >60 04/02/2020 0336    Assessment and Plan:   Jordan Rivas is a 64 y.o. y/o female has been referred for: Assessment & Plan 1.  Solid food dysphagia Chronic, progressive dysphagia requiring further evaluation and potential intervention. - Ordered barium swallow study with tablet - Ordered repeat upper endoscopy with possible esophageal dilation if indicated. - Scheduled follow-up one month post-procedure. -Scheduling EGD with Dilation I discussed risks of EGD with Dilaiton with patient to include risk of bleeding, perforation, and risk of sedation.  Patient expressed understanding and agrees to proceed with EGD.    2.  Gastroesophageal reflux disease Mild symptoms with fairly good control on high-dose proton pump inhibitor. - Continued esomeprazole 40 mg twice daily. - GERD diet  3.  Chronic constipation Chronic constipation, partially responsive to laxatives. - Prescribed lubiprostone  24mcg  tablet twice daily with food. - Instructed to titrate lubiprostone  based on bowel movement frequency and consistency. - Recommend high-fiber diet and 64 ounces of water  daily.  4.  Colon Cancer Screening - Request GI records from [facility], Dr. Rosalie - Patient states she just completed cologuard and results are pending. - Decide about further colon cancer screening based on above results.  5.  Comorbidities: history of CVA.  She takes 81 mg aspirin  daily.  No other blood thinners.  History of seizures controlled on Keppra .     Follow up 1 month after EGD with TG  Jordan Console, PA-C       [1]  Social History Tobacco Use   Smoking status: Never   Smokeless tobacco: Never  Vaping Use   Vaping status: Never Used  Substance Use Topics   Alcohol use: No   Drug use: No   "

## 2024-09-18 ENCOUNTER — Encounter: Payer: Self-pay | Admitting: Physician Assistant

## 2024-09-18 ENCOUNTER — Ambulatory Visit (INDEPENDENT_AMBULATORY_CARE_PROVIDER_SITE_OTHER): Admitting: Physician Assistant

## 2024-09-18 VITALS — BP 140/80 | HR 77 | Ht 61.0 in | Wt 200.0 lb

## 2024-09-18 DIAGNOSIS — K219 Gastro-esophageal reflux disease without esophagitis: Secondary | ICD-10-CM | POA: Diagnosis not present

## 2024-09-18 DIAGNOSIS — K5909 Other constipation: Secondary | ICD-10-CM | POA: Diagnosis not present

## 2024-09-18 DIAGNOSIS — R1310 Dysphagia, unspecified: Secondary | ICD-10-CM

## 2024-09-18 DIAGNOSIS — Z8673 Personal history of transient ischemic attack (TIA), and cerebral infarction without residual deficits: Secondary | ICD-10-CM

## 2024-09-18 MED ORDER — LUBIPROSTONE 24 MCG PO CAPS: 24.0000 ug | ORAL_CAPSULE | Freq: Two times a day (BID) | ORAL | 5 refills | Status: AC

## 2024-09-18 NOTE — Patient Instructions (Addendum)
 You have been scheduled for a Barium Esophogram at [facility] (1st floor of the hospital) on 09/22/24 at 11 am. Please arrive 30 minutes prior to your appointment for registration. Make certain not to have anything to eat or drink 3 hours prior to your test. If you need to reschedule for any reason, please contact radiology at [phone] to do so. __________________________________________________________________ A barium swallow is an examination that concentrates on views of the esophagus. This tends to be a double contrast exam (barium and two liquids which, when combined, create a gas to distend the wall of the oesophagus) or single contrast (non-ionic iodine based). The study is usually tailored to your symptoms so a good history is essential. Attention is paid during the study to the form, structure and configuration of the esophagus, looking for functional disorders (such as aspiration, dysphagia, achalasia, motility and reflux) EXAMINATION You may be asked to change into a gown, depending on the type of swallow being performed. A radiologist and radiographer will perform the procedure. The radiologist will advise you of the type of contrast selected for your procedure and direct you during the exam. You will be asked to stand, sit or lie in several different positions and to hold a small amount of fluid in your mouth before being asked to swallow while the imaging is performed .In some instances you may be asked to swallow barium coated marshmallows to assess the motility of a solid food bolus. The exam can be recorded as a digital or video fluoroscopy procedure. POST PROCEDURE It will take 1-2 days for the barium to pass through your system. To facilitate this, it is important, unless otherwise directed, to increase your fluids for the next 24-48hrs and to resume your normal diet.  This test typically takes about 30 minutes to  perform. __________________________________________________________________________________  Jordan Rivas have been scheduled for an endoscopy. Please follow written instructions given to you at your visit today.  If you use inhalers (even only as needed), please bring them with you on the day of your procedure.  If you take any of the following medications, they will need to be adjusted prior to your procedure:   DO NOT TAKE 7 DAYS PRIOR TO TEST- Trulicity (dulaglutide) Ozempic, Wegovy (semaglutide) Mounjaro, Zepbound (tirzepatide) Bydureon Bcise (exanatide extended release)  DO NOT TAKE 1 DAY PRIOR TO YOUR TEST Rybelsus (semaglutide) Adlyxin (lixisenatide) Victoza (liraglutide) Byetta (exanatide) ___________________________________________________________________________

## 2024-09-22 ENCOUNTER — Ambulatory Visit (HOSPITAL_COMMUNITY)
Admission: RE | Admit: 2024-09-22 | Discharge: 2024-09-22 | Disposition: A | Discharge status: Home / Self Care | Source: Ambulatory Visit | Attending: Physician Assistant | Admitting: Physician Assistant

## 2024-09-22 DIAGNOSIS — R1310 Dysphagia, unspecified: Secondary | ICD-10-CM | POA: Insufficient documentation

## 2024-09-23 ENCOUNTER — Ambulatory Visit: Payer: Self-pay | Admitting: Physician Assistant

## 2024-09-23 NOTE — Progress Notes (Signed)
 Please call and notify patient barium swallow with tablet showed: 1.  Esophageal dysmotility which is abnormal muscle contractions in the esophagus. 2.  Small hiatal hernia. 3.  There is a smooth narrowing of the lower esophagus where a barium tablet was delayed and eventually passed.  Possible lower esophageal stricture. 4.  No aspiration.  Mild acid reflux. 5.  I recommend continue with plan for EGD as scheduled Dr. Stacia 10/19/2024. 6.  Continue Nexium 40 mg twice daily for acid reflux.  Chew food well.  Eat slowly.  Eat sitting up.  Eat soft foods. Ellouise Console, PA-C

## 2024-09-28 NOTE — Progress Notes (Signed)
 Agree with the assessment and plan as outlined by Brigitte Canard, PA-C.

## 2024-10-07 ENCOUNTER — Ambulatory Visit: Payer: Self-pay | Admitting: Physician Assistant

## 2024-10-07 NOTE — Progress Notes (Signed)
 Notify patient I received her last colonoscopy 01/2009 at [facility] which showed NO polyps.  There were moderate internal hemorrhoids.  Patient recently completed a screening Cologuard through her PCP and I do not have results.  My recommendation: - If Cologuard test is negative, then I recommend repeat Cologuard every 3 years for colon cancer screening. - If Cologuard test is positive, then she will need a diagnostic colonoscopy.  Ellouise Console, PA-C

## 2024-10-19 ENCOUNTER — Encounter: Admitting: Gastroenterology

## 2024-11-16 ENCOUNTER — Ambulatory Visit: Admitting: Physician Assistant
